# Patient Record
Sex: Female | Born: 1940
Health system: Southern US, Community
[De-identification: ages and names within clinical notes are randomized; demographics above are authoritative.]

## PROBLEM LIST (undated history)

## (undated) DIAGNOSIS — I10 Essential (primary) hypertension: Secondary | ICD-10-CM

## (undated) DIAGNOSIS — R251 Tremor, unspecified: Secondary | ICD-10-CM

## (undated) DIAGNOSIS — H409 Unspecified glaucoma: Secondary | ICD-10-CM

## (undated) DIAGNOSIS — D219 Benign neoplasm of connective and other soft tissue, unspecified: Secondary | ICD-10-CM

## (undated) DIAGNOSIS — Z9289 Personal history of other medical treatment: Secondary | ICD-10-CM

## (undated) DIAGNOSIS — E785 Hyperlipidemia, unspecified: Secondary | ICD-10-CM

## (undated) DIAGNOSIS — N898 Other specified noninflammatory disorders of vagina: Secondary | ICD-10-CM

## (undated) DIAGNOSIS — M1990 Unspecified osteoarthritis, unspecified site: Secondary | ICD-10-CM

## (undated) DIAGNOSIS — D649 Anemia, unspecified: Secondary | ICD-10-CM

## (undated) DIAGNOSIS — I2510 Atherosclerotic heart disease of native coronary artery without angina pectoris: Secondary | ICD-10-CM

## (undated) HISTORY — DX: Anemia, unspecified: D64.9

## (undated) HISTORY — DX: Tremor, unspecified: R25.1

## (undated) HISTORY — DX: Hyperlipidemia, unspecified: E78.5

## (undated) HISTORY — DX: Other specified noninflammatory disorders of vagina: N89.8

## (undated) HISTORY — DX: Benign neoplasm of connective and other soft tissue, unspecified: D21.9

## (undated) HISTORY — DX: Unspecified osteoarthritis, unspecified site: M19.90

## (undated) HISTORY — DX: Personal history of other medical treatment: Z92.89

## (undated) HISTORY — DX: Atherosclerotic heart disease of native coronary artery without angina pectoris: I25.10

## (undated) HISTORY — DX: Unspecified glaucoma: H40.9

## (undated) HISTORY — PX: BREAST SURGERY: SHX581

## (undated) HISTORY — DX: Essential (primary) hypertension: I10

---

## 1975-07-26 HISTORY — PX: TUBAL LIGATION: SHX77

## 1993-07-25 HISTORY — PX: ABDOMINAL HYSTERECTOMY: SHX81

## 1998-06-02 ENCOUNTER — Ambulatory Visit (HOSPITAL_COMMUNITY): Admission: RE | Admit: 1998-06-02 | Discharge: 1998-06-02 | Payer: Self-pay | Admitting: Gastroenterology

## 2000-11-02 ENCOUNTER — Ambulatory Visit (HOSPITAL_COMMUNITY): Admission: RE | Admit: 2000-11-02 | Discharge: 2000-11-02 | Payer: Self-pay | Admitting: Family Medicine

## 2000-11-02 ENCOUNTER — Encounter: Payer: Self-pay | Admitting: Family Medicine

## 2001-10-12 ENCOUNTER — Other Ambulatory Visit: Admission: RE | Admit: 2001-10-12 | Discharge: 2001-10-12 | Payer: Self-pay | Admitting: Obstetrics and Gynecology

## 2001-10-17 ENCOUNTER — Ambulatory Visit (HOSPITAL_COMMUNITY): Admission: RE | Admit: 2001-10-17 | Discharge: 2001-10-17 | Payer: Self-pay | Admitting: Gastroenterology

## 2001-11-07 ENCOUNTER — Encounter: Payer: Self-pay | Admitting: Gastroenterology

## 2001-11-07 ENCOUNTER — Encounter: Admission: RE | Admit: 2001-11-07 | Discharge: 2001-11-07 | Payer: Self-pay | Admitting: Gastroenterology

## 2003-07-24 ENCOUNTER — Ambulatory Visit (HOSPITAL_COMMUNITY): Admission: RE | Admit: 2003-07-24 | Discharge: 2003-07-24 | Payer: Self-pay | Admitting: *Deleted

## 2003-07-26 HISTORY — PX: TRANSTHORACIC ECHOCARDIOGRAM: SHX275

## 2003-08-21 ENCOUNTER — Ambulatory Visit (HOSPITAL_COMMUNITY): Admission: RE | Admit: 2003-08-21 | Discharge: 2003-08-21 | Payer: Self-pay | Admitting: Neurology

## 2003-12-08 ENCOUNTER — Other Ambulatory Visit: Admission: RE | Admit: 2003-12-08 | Discharge: 2003-12-08 | Payer: Self-pay | Admitting: Obstetrics and Gynecology

## 2007-05-24 ENCOUNTER — Ambulatory Visit (HOSPITAL_COMMUNITY): Admission: RE | Admit: 2007-05-24 | Discharge: 2007-05-24 | Payer: Self-pay | Admitting: *Deleted

## 2007-05-31 HISTORY — PX: CARDIAC CATHETERIZATION: SHX172

## 2007-08-30 ENCOUNTER — Encounter: Admission: RE | Admit: 2007-08-30 | Discharge: 2007-08-30 | Payer: Self-pay | Admitting: Family Medicine

## 2008-04-30 ENCOUNTER — Ambulatory Visit: Payer: Self-pay | Admitting: Vascular Surgery

## 2010-05-25 DIAGNOSIS — Z9289 Personal history of other medical treatment: Secondary | ICD-10-CM

## 2010-05-25 HISTORY — DX: Personal history of other medical treatment: Z92.89

## 2010-08-14 ENCOUNTER — Encounter: Payer: Self-pay | Admitting: Neurology

## 2010-11-05 ENCOUNTER — Emergency Department (HOSPITAL_COMMUNITY)
Admission: EM | Admit: 2010-11-05 | Discharge: 2010-11-05 | Disposition: A | Discharge status: Home / Self Care | Payer: Medicare Other | Attending: Emergency Medicine | Admitting: Emergency Medicine

## 2010-11-05 ENCOUNTER — Emergency Department (HOSPITAL_COMMUNITY): Payer: Medicare Other

## 2010-11-05 DIAGNOSIS — Z79899 Other long term (current) drug therapy: Secondary | ICD-10-CM | POA: Insufficient documentation

## 2010-11-05 DIAGNOSIS — Y929 Unspecified place or not applicable: Secondary | ICD-10-CM | POA: Insufficient documentation

## 2010-11-05 DIAGNOSIS — R51 Headache: Secondary | ICD-10-CM | POA: Insufficient documentation

## 2010-11-05 DIAGNOSIS — S0003XA Contusion of scalp, initial encounter: Secondary | ICD-10-CM | POA: Insufficient documentation

## 2010-11-05 DIAGNOSIS — S40029A Contusion of unspecified upper arm, initial encounter: Secondary | ICD-10-CM | POA: Insufficient documentation

## 2010-11-05 DIAGNOSIS — M25559 Pain in unspecified hip: Secondary | ICD-10-CM | POA: Insufficient documentation

## 2010-11-05 DIAGNOSIS — M5020 Other cervical disc displacement, unspecified cervical region: Secondary | ICD-10-CM | POA: Insufficient documentation

## 2010-11-05 DIAGNOSIS — I10 Essential (primary) hypertension: Secondary | ICD-10-CM | POA: Insufficient documentation

## 2010-11-05 DIAGNOSIS — R220 Localized swelling, mass and lump, head: Secondary | ICD-10-CM | POA: Insufficient documentation

## 2010-11-25 ENCOUNTER — Other Ambulatory Visit: Payer: Self-pay | Admitting: Orthopedic Surgery

## 2010-11-25 DIAGNOSIS — M25512 Pain in left shoulder: Secondary | ICD-10-CM

## 2010-11-25 DIAGNOSIS — M25511 Pain in right shoulder: Secondary | ICD-10-CM

## 2010-11-25 DIAGNOSIS — M542 Cervicalgia: Secondary | ICD-10-CM

## 2010-11-25 DIAGNOSIS — R519 Headache, unspecified: Secondary | ICD-10-CM

## 2010-12-02 ENCOUNTER — Ambulatory Visit (HOSPITAL_COMMUNITY)
Admission: RE | Admit: 2010-12-02 | Discharge: 2010-12-02 | Disposition: A | Discharge status: Home / Self Care | Payer: Medicare Other | Source: Ambulatory Visit | Attending: Family Medicine | Admitting: Family Medicine

## 2010-12-02 DIAGNOSIS — M25659 Stiffness of unspecified hip, not elsewhere classified: Secondary | ICD-10-CM | POA: Insufficient documentation

## 2010-12-02 DIAGNOSIS — I10 Essential (primary) hypertension: Secondary | ICD-10-CM | POA: Insufficient documentation

## 2010-12-02 DIAGNOSIS — M25559 Pain in unspecified hip: Secondary | ICD-10-CM | POA: Insufficient documentation

## 2010-12-02 DIAGNOSIS — M6281 Muscle weakness (generalized): Secondary | ICD-10-CM | POA: Insufficient documentation

## 2010-12-02 DIAGNOSIS — R262 Difficulty in walking, not elsewhere classified: Secondary | ICD-10-CM | POA: Insufficient documentation

## 2010-12-02 DIAGNOSIS — IMO0001 Reserved for inherently not codable concepts without codable children: Secondary | ICD-10-CM | POA: Insufficient documentation

## 2010-12-07 NOTE — Consult Note (Signed)
NEW PATIENT CONSULTATION   Nicole Meadows, Nicole Meadows  DOB:  February 23, 1941                                       04/30/2008  ZOXWR#:[id number]   The patient presents today for evaluation of bruising spontaneously on  her legs, abdomen, and arms.  She also had a prior history of spider  vein telangiectasia and is seen today for evaluation.  She currently  does not have any bruising present.  She has been on aspirin therapy for  over 3 years.  She does not have any other history of bleeding  tendencies, specifically no GI bleeding.  She does have a history of  reflux.  Her main medical problems are hypertension and elevated  cholesterol, which are both under control with medication.   FAMILY HISTORY:  Atherosclerotic disease in her mother and brothers.  Also has a history of abdominal aortic aneurysm in her brother.   SOCIAL HISTORY:  She is married with 2 children.  She is a retired  Runner, broadcasting/film/video.  She does not smoke or drink alcohol.   REVIEW OF SYSTEMS:  Positive for weight gain at 192 pounds.  She is 5  feet 5 inches tall.  She has shortness of breath with exertion,  bronchitis, headache, arthritis, and recent eyesight changes.   CURRENT MEDICATIONS:  Micardis hydrochlorothiazide 80 mg/12.5 mg.  Protonix.  Crestor.  Astelin.  81 mg aspirin.   ALLERGIES:  Penicillin causes rash.   PHYSICAL EXAM:  Well-developed, well-nourished black female appearing  stated age of 36.  Blood pressure is 126/75, pulse 84, respirations 18.  Her extremities are noted for no bruises.  She has 2+ radial and 2+  dorsalis pedis pulses bilaterally.  She does have mild pitting edema at  the levels of the ankles.  She has very few scattered telangiectasia on  her upper lateral thighs.  She does not have any varicosities.   She underwent a screening venous duplex by me and this showed no  evidence of reflux in her normal-sized saphenous veins bilaterally.  I  discussed this at length with the  patient.  I explained that I suspect  her bruising probably is related to her aspirin therapy.  She reports  having blood work done at Dr. Tedra [place] office, but does not recall  discussing platelets.  I explained that this is a very common lab study  and suspect that this has been checked, but if not, she will discuss  this with Dr. Parke Simmers at her next visit as well.  She will see [place] again on  as needed basis.   Larina Earthly, M.D.  Electronically Signed   TFE/MEDQ  D:  04/30/2008  T:  05/01/2008  Job:  [id number]   cc:   Renaye Rakers, M.D.

## 2010-12-09 ENCOUNTER — Ambulatory Visit
Admission: RE | Admit: 2010-12-09 | Discharge: 2010-12-09 | Disposition: A | Discharge status: Home / Self Care | Payer: Medicare Other | Source: Ambulatory Visit | Attending: Orthopedic Surgery | Admitting: Orthopedic Surgery

## 2010-12-09 DIAGNOSIS — M542 Cervicalgia: Secondary | ICD-10-CM

## 2010-12-09 DIAGNOSIS — M25511 Pain in right shoulder: Secondary | ICD-10-CM

## 2010-12-09 DIAGNOSIS — R519 Headache, unspecified: Secondary | ICD-10-CM

## 2010-12-13 ENCOUNTER — Ambulatory Visit (HOSPITAL_COMMUNITY): Payer: Medicare Other | Admitting: Physical Therapy

## 2010-12-16 ENCOUNTER — Ambulatory Visit (HOSPITAL_COMMUNITY)
Admission: RE | Admit: 2010-12-16 | Discharge: 2010-12-16 | Disposition: A | Discharge status: Home / Self Care | Payer: Medicare Other | Source: Ambulatory Visit | Attending: Family Medicine | Admitting: Family Medicine

## 2010-12-22 ENCOUNTER — Ambulatory Visit (HOSPITAL_COMMUNITY)
Admission: RE | Admit: 2010-12-22 | Discharge: 2010-12-22 | Disposition: A | Discharge status: Home / Self Care | Payer: Medicare Other | Source: Ambulatory Visit | Attending: Family Medicine | Admitting: Family Medicine

## 2010-12-27 ENCOUNTER — Ambulatory Visit (HOSPITAL_COMMUNITY)
Admission: RE | Admit: 2010-12-27 | Discharge: 2010-12-27 | Disposition: A | Discharge status: Home / Self Care | Payer: Medicare Other | Source: Ambulatory Visit | Attending: Family Medicine | Admitting: Family Medicine

## 2010-12-27 DIAGNOSIS — M25659 Stiffness of unspecified hip, not elsewhere classified: Secondary | ICD-10-CM | POA: Insufficient documentation

## 2010-12-27 DIAGNOSIS — M6281 Muscle weakness (generalized): Secondary | ICD-10-CM | POA: Insufficient documentation

## 2010-12-27 DIAGNOSIS — IMO0001 Reserved for inherently not codable concepts without codable children: Secondary | ICD-10-CM | POA: Insufficient documentation

## 2010-12-27 DIAGNOSIS — I10 Essential (primary) hypertension: Secondary | ICD-10-CM | POA: Insufficient documentation

## 2010-12-27 DIAGNOSIS — M25559 Pain in unspecified hip: Secondary | ICD-10-CM | POA: Insufficient documentation

## 2010-12-27 DIAGNOSIS — R262 Difficulty in walking, not elsewhere classified: Secondary | ICD-10-CM | POA: Insufficient documentation

## 2010-12-30 ENCOUNTER — Ambulatory Visit (HOSPITAL_COMMUNITY)
Admission: RE | Admit: 2010-12-30 | Discharge: 2010-12-30 | Disposition: A | Discharge status: Home / Self Care | Payer: Medicare Other | Source: Ambulatory Visit | Attending: Family Medicine | Admitting: Family Medicine

## 2012-04-06 DIAGNOSIS — R251 Tremor, unspecified: Secondary | ICD-10-CM | POA: Insufficient documentation

## 2012-04-12 ENCOUNTER — Encounter: Payer: Self-pay | Admitting: Obstetrics and Gynecology

## 2012-04-12 ENCOUNTER — Ambulatory Visit (INDEPENDENT_AMBULATORY_CARE_PROVIDER_SITE_OTHER): Payer: Medicare Other | Admitting: Obstetrics and Gynecology

## 2012-04-12 VITALS — BP 104/64 | HR 72 | Resp 16 | Ht 65.0 in | Wt 187.0 lb

## 2012-04-12 DIAGNOSIS — Z01419 Encounter for gynecological examination (general) (routine) without abnormal findings: Secondary | ICD-10-CM

## 2012-04-12 DIAGNOSIS — Z124 Encounter for screening for malignant neoplasm of cervix: Secondary | ICD-10-CM

## 2012-04-12 NOTE — Progress Notes (Signed)
Subjective:    Nicole Meadows is a 71 y.o. female G2P2 who presents for annual exam. Doing well. Status post hysterectomy and BSO.  The following portions of the patient's history were reviewed and updated as appropriate: allergies, current medications, past family history, past medical history, past social history, past surgical history and problem list. The patient has a history of osteopenia.  Review of Systems Pertinent items are noted in HPI. Gastrointestinal:No change in bowel habits, no abdominal pain, no rectal bleeding Genitourinary:negative for dysuria, frequency, hematuria, nocturia and urinary incontinence    Objective:     BP 104/64  Pulse 72  Resp 16  Ht 5\' 5"  (1.651 m)  Wt 187 lb (84.823 kg)  BMI 31.12 kg/m2  Weight:  Wt Readings from Last 1 Encounters:  04/12/12 187 lb (84.823 kg)     BMI: Body mass index is 31.12 kg/(m^2). General Appearance: Alert, appropriate appearance for age. No acute distress HEENT: Grossly normal Neck / Thyroid: Supple, no masses, nodes or enlargement Lungs: clear to auscultation bilaterally Back: No CVA tenderness Breast Exam: No masses or nodes.No dimpling, nipple retraction or discharge. Cardiovascular: Regular rate and rhythm. S1, S2, no murmur Gastrointestinal: Soft, non-tender, no masses or organomegaly  ++++++++++++++++++++++++++++++++++++++++++++++++++++++++  Pelvic Exam: External genitalia: normal general appearance Vaginal: atrophic, relaxation noted Cervix: absent Adnexa: normal bimanual exam Uterus: absent Rectovaginal: normal rectal, no masses  ++++++++++++++++++++++++++++++++++++++++++++++++++++++++  Lymphatic Exam: Non-palpable nodes in neck, clavicular, axillary, or inguinal regions  Psychiatric: Alert and oriented, appropriate affect.@OBJECTIVEEN @      Assessment:    Normal gyn exam Menopause   Overweight or obese: Yes  Pelvic relaxation: Yes  Menopausal symptoms: No. Severe: No.  Osteopenia.     Plan:    Mammogram.   Follow-up:  for annual exam  The updated Pap smear screening guidelines were discussed with the patient. The patient requested that I obtain a Pap smear: No.  Kegel exercises discussed: Yes.  Proper diet and regular exercise were reviewed.  Annual mammograms recommended starting at age 75. Proper breast care was discussed.  Screening colonoscopy is recommended beginning at age 3.  Regular health maintenance was reviewed.  Sleep hygiene was discussed.  Adequate calcium and vitamin D intake was emphasized. Needs bone density scan.  Leonard Schwartz M.D.   Regular Periods: no/Hysterectomy Mammogram: yes  Monthly Breast Ex.: yes Exercise: yes  Tetanus < 10 years: yes Seatbelts: yes  NI. Bladder Functn.: yes Abuse at home: no  Daily BM's: yes Stressful Work: no  Healthy Diet: yes Sigmoid-Colonoscopy 2003 WNL   Calcium: yes Medical problems this year: None   LAST PAP:09/16/2008 WNL  Contraception: None  Mammogram:  06/06/2011 WNL  PCP: Kipp Brood  PMH: None  FMH: None

## 2012-04-23 ENCOUNTER — Telehealth: Payer: Self-pay

## 2012-04-23 NOTE — Telephone Encounter (Signed)
Per AVS he wanted pt to have a Bone Density scheduled if she has not had one in 2 years. Pt was called this morning to be made aware. And pt stated she has a Bone Density scheduled in November at the facility where she gets her mammograms.  [facility] CMA

## 2012-06-01 ENCOUNTER — Ambulatory Visit (HOSPITAL_COMMUNITY)
Admission: RE | Admit: 2012-06-01 | Discharge: 2012-06-01 | Disposition: A | Discharge status: Home / Self Care | Payer: Medicare Other | Source: Ambulatory Visit | Attending: Family Medicine | Admitting: Family Medicine

## 2012-06-01 ENCOUNTER — Other Ambulatory Visit (HOSPITAL_COMMUNITY): Payer: Self-pay | Admitting: Family Medicine

## 2012-06-01 DIAGNOSIS — R519 Headache, unspecified: Secondary | ICD-10-CM

## 2012-06-01 DIAGNOSIS — R51 Headache: Secondary | ICD-10-CM | POA: Insufficient documentation

## 2012-06-18 ENCOUNTER — Encounter: Payer: Self-pay | Admitting: Obstetrics and Gynecology

## 2012-06-27 NOTE — Progress Notes (Signed)
Quick Note:  Send a letter that her labs are normal. Include a copy of the labs with the letter. ______ 

## 2013-02-19 ENCOUNTER — Other Ambulatory Visit (HOSPITAL_COMMUNITY)
Admission: RE | Admit: 2013-02-19 | Discharge: 2013-02-19 | Disposition: A | Discharge status: Home / Self Care | Payer: Medicare Other | Source: Ambulatory Visit | Attending: Family Medicine | Admitting: Family Medicine

## 2013-02-19 DIAGNOSIS — N760 Acute vaginitis: Secondary | ICD-10-CM | POA: Insufficient documentation

## 2013-07-05 ENCOUNTER — Encounter: Payer: Self-pay | Admitting: *Deleted

## 2013-07-08 ENCOUNTER — Encounter: Payer: Self-pay | Admitting: Internal Medicine

## 2013-07-08 ENCOUNTER — Ambulatory Visit (INDEPENDENT_AMBULATORY_CARE_PROVIDER_SITE_OTHER): Payer: Medicare Other | Admitting: Internal Medicine

## 2013-07-08 VITALS — BP 112/68 | HR 73 | Ht 65.5 in | Wt 192.1 lb

## 2013-07-08 DIAGNOSIS — I10 Essential (primary) hypertension: Secondary | ICD-10-CM | POA: Insufficient documentation

## 2013-07-08 DIAGNOSIS — E785 Hyperlipidemia, unspecified: Secondary | ICD-10-CM

## 2013-07-08 MED ORDER — TELMISARTAN-HCTZ 80-12.5 MG PO TABS: 1.0000 | ORAL_TABLET | Freq: Every day | ORAL | Status: AC

## 2013-07-08 NOTE — Patient Instructions (Signed)
Your physician wants you to follow-up in: 1 year. You will receive a reminder letter in the mail two months in advance. If you don't receive a letter, please call our office to schedule the follow-up appointment.  

## 2013-07-08 NOTE — Progress Notes (Signed)
OFFICE NOTE  Chief Complaint:  Left breast pain  Primary Care Physician: Geraldo Pitter, MD  HPI:  Nicole Meadows  is a pleasant 72 year old female with history of moderate coronary disease by cath on medical therapy, also problems with up-and-down weight and essential tremor. She has dyslipidemia which has been fairly well controlled and hypertension which is also at goal. Her only other concern is that really she's had some left breast pain. The symptom are somewhat toothache-like, and seemed to been improved with topical corticosteroids. She saw dermatologist who prescribed this medicine. She denies any worsening shortness of breath with exertion but has had several pound weight gain and knows that she needs to work on this. She's been taking care of her husband who recently had knee replacement.  PMHx:  Past Medical History  Diagnosis Date  . Tremor   . Vaginal dryness   . Fibroid   . Hypertension   . Coronary artery disease     moderate, by cath  . History of nuclear stress test 05/2010    bruce myoview; small fixed distal anteroapical, apical defect (breast attenuation or apical thinning), no reversible ischemia, post-stress EF 83%, abnormal but low risk study   . Dyslipidemia     Past Surgical History  Procedure Laterality Date  . Abdominal hysterectomy  1995  . Tubal ligation  1977  . Transthoracic echocardiogram  2005    EF 60% with borderline conc LVH; mild TR, RVSP   . Cardiac catheterization  05/31/2007    no signficant CAD, low normal EF (Dr. Laurell Josephs)     FAMHx:  Family History  Problem Relation Age of Onset  . Cancer Sister     uterine  . Hypertension Mother   . Sudden death Mother   . Heart disease Father   . Kidney failure Father   . Heart failure Brother     DM, HTN  . Aneurysm Brother     abdominal; also HTN  . Hypertension Brother     sudden death  . Hypertension Brother   . Hyperlipidemia Brother     also DM  . Hypertension  Brother   . Other Brother     2 brain surgeries, bleeding    SOCHx:   reports that she has never smoked. She has never used smokeless tobacco. She reports that she does not drink alcohol or use illicit drugs.  ALLERGIES:  Allergies  Allergen Reactions  . Penicillins   . Zocor [Simvastatin]     myalgias    ROS: A comprehensive review of systems was negative except for: Constitutional: positive for weight gain Respiratory: positive for dyspnea on exertion  HOME MEDS: Current Outpatient Prescriptions  Medication Sig Dispense Refill  . aspirin 81 MG tablet Take 81 mg by mouth daily.      [name] augmented betamethasone dipropionate (DIPROLENE-AF) 0.05 % cream daily as needed.      [name] azelastine (ASTELIN) 137 MCG/SPRAY nasal spray Place 1 spray into the nose 2 (two) times daily. Use in each nostril as directed      . Calcium Carbonate-Vitamin D (CALCIUM-D PO) Take by mouth.      . cycloSPORINE (RESTASIS) 0.05 % ophthalmic emulsion Place 1 drop into both eyes daily.      . fluticasone (FLONASE) 50 MCG/ACT nasal spray Place 1 spray into both nostrils every morning.       . nitroGLYCERIN (NITROSTAT) 0.4 MG SL tablet Place 0.4 mg under the tongue every 5 (five)  minutes as needed for chest pain.      . Omega 3-6-9 Fatty Acids (OMEGA 3-6-9 COMPLEX PO) Take 1,200 mg by mouth daily.      . pantoprazole (PROTONIX) 40 MG tablet Take 40 mg by mouth daily.      . rosuvastatin (CRESTOR) 10 MG tablet Take 5 mg by mouth daily.       [name] telmisartan-hydrochlorothiazide (MICARDIS HCT) 80-12.5 MG per tablet Take 1 tablet by mouth daily.  28 tablet  0   No current facility-administered medications for this visit.    LABS/IMAGING: No results found for this or any previous visit (from the past 48 hour(s)). No results found.  VITALS: BP 112/68  Pulse 73  Ht 5' 5.5" (1.664 m)  Wt 192 lb 1.6 oz (87.136 kg)  BMI 31.47 kg/m2  EXAM: General appearance: alert and no distress Neck: no carotid bruit and no  JVD Lungs: clear to auscultation bilaterally Heart: regular rate and rhythm, S1, S2 normal, no murmur, click, rub or gallop Abdomen: soft, non-tender; bowel sounds normal; no masses,  no organomegaly Extremities: extremities normal, atraumatic, no cyanosis or edema Pulses: 2+ and symmetric Skin: Skin color, texture, turgor normal. No rashes or lesions Neurologic: Grossly normal psych: Mood, affect normal  EKG: Normal sinus rhythm at 73  ASSESSMENT: 1. Hypertension-controlled 2. Dyslipidemia-at goal 3. Essential tremor 4. Obesity  PLAN: 1.   Nicole Meadows is doing well and his head good blood pressure control and my card is. She prefers to stay on the brand name medication and we've given her samples of that today. She is on Crestor and had lipid testing about 6 months ago which was well-controlled. She is due for another check to her primary care provider next month. Her tremor is stable. Otherwise she's had a small amount of weight gain he needs to work on exercise and weight loss, but is doing well. We'll plan to see her back annually or sooner as necessary.  Chrystie Nose, MD, [facility] Attending Cardiologist [facility]  Nicole Meadows 07/08/2013, 11:51 AM

## 2013-08-07 ENCOUNTER — Ambulatory Visit (HOSPITAL_COMMUNITY)
Admission: RE | Admit: 2013-08-07 | Discharge: 2013-08-07 | Disposition: A | Discharge status: Home / Self Care | Payer: Medicare Other | Source: Ambulatory Visit | Attending: Family Medicine | Admitting: Family Medicine

## 2013-08-07 ENCOUNTER — Other Ambulatory Visit (HOSPITAL_COMMUNITY): Payer: Self-pay | Admitting: Family Medicine

## 2013-08-07 DIAGNOSIS — M1250 Traumatic arthropathy, unspecified site: Secondary | ICD-10-CM

## 2013-08-07 DIAGNOSIS — M25559 Pain in unspecified hip: Secondary | ICD-10-CM | POA: Insufficient documentation

## 2013-08-07 DIAGNOSIS — M545 Low back pain, unspecified: Secondary | ICD-10-CM | POA: Insufficient documentation

## 2013-08-22 ENCOUNTER — Encounter (INDEPENDENT_AMBULATORY_CARE_PROVIDER_SITE_OTHER): Payer: Self-pay | Admitting: Surgery

## 2013-08-30 ENCOUNTER — Ambulatory Visit (INDEPENDENT_AMBULATORY_CARE_PROVIDER_SITE_OTHER): Payer: Medicare Other | Admitting: Surgery

## 2013-08-30 ENCOUNTER — Encounter (INDEPENDENT_AMBULATORY_CARE_PROVIDER_SITE_OTHER): Payer: Self-pay | Admitting: Surgery

## 2013-08-30 ENCOUNTER — Encounter (INDEPENDENT_AMBULATORY_CARE_PROVIDER_SITE_OTHER): Payer: Self-pay

## 2013-08-30 VITALS — BP 120/88 | HR 68 | Temp 97.8°F | Resp 14 | Ht 65.5 in | Wt 190.6 lb

## 2013-08-30 DIAGNOSIS — C50019 Malignant neoplasm of nipple and areola, unspecified female breast: Secondary | ICD-10-CM | POA: Insufficient documentation

## 2013-08-30 NOTE — Progress Notes (Signed)
Patient ID: [name], female   DOB: [birth year], 73 y.o.   MRN: [id number]  Chief Complaint  Patient presents with  . New Evaluation    eval left br and nipple/tenderess and pain and itching    HPI Nicole Meadows is a 73 y.o. female.   HPI This is a pleasant female referred by Dr. Criss Rosales for evaluation of left breast nipple and areolar itching and breast pain. This has been occurring intermittently for the past year. She has had normal mammograms for the past 2 years except for dense breasts. She has had skin issues with dermatitis. She denies any drainage from the nipples. She has not had had biopsies of the breast in the past. Past Medical History  Diagnosis Date  . Tremor   . Vaginal dryness   . Fibroid   . Hypertension   . Coronary artery disease     moderate, by cath  . History of nuclear stress test 05/2010    bruce myoview; small fixed distal anteroapical, apical defect (breast attenuation or apical thinning), no reversible ischemia, post-stress EF 83%, abnormal but low risk study   . Dyslipidemia   . Anemia   . Arthritis   . Glaucoma   . Hyperlipidemia     Past Surgical History  Procedure Laterality Date  . Abdominal hysterectomy  1995  . Tubal ligation  1977  . Transthoracic echocardiogram  2005    EF 60% with borderline conc LVH; mild TR, RVSP 31mmHg   . Cardiac catheterization  05/31/2007    no signficant CAD, low normal EF (Dr. Gerrie Nordmann)     Family History  Problem Relation Age of Onset  . Cancer Sister     uterine  . Hypertension Mother   . Sudden death Mother   . Heart disease Father   . Kidney failure Father   . Heart failure Brother     DM, HTN  . Aneurysm Brother     abdominal; also HTN  . Hypertension Brother     sudden death  . Hypertension Brother   . Hyperlipidemia Brother     also DM  . Hypertension Brother   . Other Brother     2 brain surgeries, bleeding    Social History History  Substance Use Topics  . Smoking status:  Never Smoker   . Smokeless tobacco: Never Used  . Alcohol Use: No    Allergies  Allergen Reactions  . Penicillins   . Zocor [Simvastatin]     myalgias    Current Outpatient Prescriptions  Medication Sig Dispense Refill  . aspirin 81 MG tablet Take 81 mg by mouth daily.      [name] augmented betamethasone dipropionate (DIPROLENE-AF) 0.05 % cream daily as needed.      [name] azelastine (ASTELIN) 137 MCG/SPRAY nasal spray Place 1 spray into the nose 2 (two) times daily. Use in each nostril as directed      . Calcium Carbonate-Vitamin D (CALCIUM-D PO) Take by mouth.      . calcium citrate-vitamin D (CITRACAL+D) 315-200 MG-UNIT per tablet Take 1 tablet by mouth 2 (two) times daily.      . celecoxib (CELEBREX) 200 MG capsule       . cycloSPORINE (RESTASIS) 0.05 % ophthalmic emulsion Place 1 drop into both eyes daily.      [name] FLAX OIL-FISH OIL-BORAGE OIL PO Take by mouth.      . fluticasone (FLONASE) 50 MCG/ACT nasal spray Place 1 spray into both nostrils every  morning.       . nitroGLYCERIN (NITROSTAT) 0.4 MG SL tablet Place 0.4 mg under the tongue every 5 (five) minutes as needed for chest pain.      . Omega 3-6-9 Fatty Acids (OMEGA 3-6-9 COMPLEX PO) Take 1,200 mg by mouth daily.      . pantoprazole (PROTONIX) 40 MG tablet Take 40 mg by mouth daily.      . rosuvastatin (CRESTOR) 10 MG tablet Take 5 mg by mouth daily.       [name] telmisartan-hydrochlorothiazide (MICARDIS HCT) 80-12.5 MG per tablet Take 1 tablet by mouth daily.  28 tablet  0   No current facility-administered medications for this visit.    Review of Systems Review of Systems  Constitutional: Negative for fever, chills and unexpected weight change.  HENT: Negative for congestion, hearing loss, sore throat, trouble swallowing and voice change.   Eyes: Negative for visual disturbance.  Respiratory: Negative for cough and wheezing.   Cardiovascular: Negative for chest pain, palpitations and leg swelling.  Gastrointestinal: Negative for  nausea, vomiting, abdominal pain, diarrhea, constipation, blood in stool, abdominal distention and anal bleeding.  Genitourinary: Negative for hematuria, vaginal bleeding and difficulty urinating.  Musculoskeletal: Negative for arthralgias.  Skin: Negative for rash and wound.  Neurological: Negative for seizures, syncope and headaches.  Hematological: Negative for adenopathy. Does not bruise/bleed easily.  Psychiatric/Behavioral: Negative for confusion.    Blood pressure 120/88, pulse 68, temperature 97.8 F (36.6 C), temperature source Oral, resp. rate 14, height 5' 5.5" (1.664 m), weight 190 lb 9.6 oz (86.456 kg).  Physical Exam Physical Exam  Constitutional: She is oriented to person, place, and time. She appears well-developed and well-nourished. No distress.  HENT:  Head: Normocephalic and atraumatic.  Right Ear: External ear normal.  Left Ear: External ear normal.  Nose: Nose normal.  Mouth/Throat: Oropharynx is clear and moist. No oropharyngeal exudate.  Eyes: Conjunctivae are normal. Pupils are equal, round, and reactive to light.  Neck: Normal range of motion. Neck supple. No tracheal deviation present. No thyromegaly present.  Cardiovascular: Normal rate, regular rhythm, normal heart sounds and intact distal pulses.   No murmur heard. Pulmonary/Chest: Breath sounds normal. No respiratory distress. She has no wheezes.  Abdominal: Soft. Bowel sounds are normal. There is no tenderness.  Musculoskeletal: Normal range of motion. She exhibits no edema and no tenderness.  Lymphadenopathy:    She has no cervical adenopathy.    She has no axillary adenopathy.  Neurological: She is alert and oriented to person, place, and time.  She does have a tremor  Skin: Skin is warm and dry. No rash noted. No erythema.  Psychiatric: Her behavior is normal. Judgment normal.  Breasts: There are no palpable masses in the breast. There is a scaly, rough patch of skin on the areola of the left  breast.  Data Reviewed   Assessment    Left breast itching and abnormal skin of the left breast areola     Plan    I believe this area needs to be biopsied to rule out Paget's disease. I discussed this with her in detail. I discussed the risks of surgery which includes but is not limited to bleeding, infection, need for further surgery, et Ronney Asters. She understands and wished to proceed. Surgery will be scheduled        Grizelda Piscopo A 08/30/2013, 12:01 PM

## 2013-09-26 ENCOUNTER — Other Ambulatory Visit (INDEPENDENT_AMBULATORY_CARE_PROVIDER_SITE_OTHER): Payer: Self-pay | Admitting: Surgery

## 2013-09-26 ENCOUNTER — Other Ambulatory Visit (INDEPENDENT_AMBULATORY_CARE_PROVIDER_SITE_OTHER): Payer: Self-pay | Admitting: *Deleted

## 2013-09-26 DIAGNOSIS — L259 Unspecified contact dermatitis, unspecified cause: Secondary | ICD-10-CM

## 2013-09-26 MED ORDER — HYDROCODONE-ACETAMINOPHEN 5-325 MG PO TABS: 1.0000 | ORAL_TABLET | ORAL | Status: DC | PRN

## 2013-10-21 ENCOUNTER — Ambulatory Visit (INDEPENDENT_AMBULATORY_CARE_PROVIDER_SITE_OTHER): Payer: Medicare Other | Admitting: Surgery

## 2013-10-21 ENCOUNTER — Encounter (INDEPENDENT_AMBULATORY_CARE_PROVIDER_SITE_OTHER): Payer: Self-pay

## 2013-10-21 VITALS — BP 138/78 | HR 77 | Temp 98.4°F | Resp 16 | Ht 65.0 in | Wt 194.0 lb

## 2013-10-21 DIAGNOSIS — Z09 Encounter for follow-up examination after completed treatment for conditions other than malignant neoplasm: Secondary | ICD-10-CM

## 2013-10-21 NOTE — Progress Notes (Signed)
Subjective:     Patient ID: Nicole Meadows, female   DOB: [birth year], 73 y.o.   MRN: [id number]  HPI She is here for her first postoperative visit status post incisional biopsy of the left breast areola. She is doing well and has no complaints  Review of Systems     Objective:   Physical Exam On exam, her incision is well-healed. There dermatitis remains on the areola of the left breast  The pathology confirmed a dermatitis with no evidence of Paget's disease or malignancy    Assessment:     Patient stable postop     Plan:     I explained the path report to her. She will call back her dermatologist for followup. I will see her as needed

## 2013-10-30 ENCOUNTER — Encounter (HOSPITAL_COMMUNITY): Payer: Self-pay | Admitting: Emergency Medicine

## 2013-10-30 ENCOUNTER — Emergency Department (HOSPITAL_COMMUNITY)
Admission: EM | Admit: 2013-10-30 | Discharge: 2013-10-30 | Disposition: A | Discharge status: Home / Self Care | Payer: Medicare Other | Attending: Emergency Medicine | Admitting: Emergency Medicine

## 2013-10-30 ENCOUNTER — Emergency Department (HOSPITAL_COMMUNITY): Payer: Medicare Other

## 2013-10-30 DIAGNOSIS — Z8742 Personal history of other diseases of the female genital tract: Secondary | ICD-10-CM | POA: Insufficient documentation

## 2013-10-30 DIAGNOSIS — Z880 Allergy status to penicillin: Secondary | ICD-10-CM | POA: Insufficient documentation

## 2013-10-30 DIAGNOSIS — I10 Essential (primary) hypertension: Secondary | ICD-10-CM | POA: Insufficient documentation

## 2013-10-30 DIAGNOSIS — M129 Arthropathy, unspecified: Secondary | ICD-10-CM | POA: Insufficient documentation

## 2013-10-30 DIAGNOSIS — E785 Hyperlipidemia, unspecified: Secondary | ICD-10-CM | POA: Insufficient documentation

## 2013-10-30 DIAGNOSIS — Z79899 Other long term (current) drug therapy: Secondary | ICD-10-CM | POA: Insufficient documentation

## 2013-10-30 DIAGNOSIS — IMO0002 Reserved for concepts with insufficient information to code with codable children: Secondary | ICD-10-CM | POA: Insufficient documentation

## 2013-10-30 DIAGNOSIS — R51 Headache: Secondary | ICD-10-CM | POA: Insufficient documentation

## 2013-10-30 DIAGNOSIS — Z7982 Long term (current) use of aspirin: Secondary | ICD-10-CM | POA: Insufficient documentation

## 2013-10-30 DIAGNOSIS — I2510 Atherosclerotic heart disease of native coronary artery without angina pectoris: Secondary | ICD-10-CM | POA: Insufficient documentation

## 2013-10-30 DIAGNOSIS — R112 Nausea with vomiting, unspecified: Secondary | ICD-10-CM | POA: Insufficient documentation

## 2013-10-30 DIAGNOSIS — R519 Headache, unspecified: Secondary | ICD-10-CM

## 2013-10-30 DIAGNOSIS — Z862 Personal history of diseases of the blood and blood-forming organs and certain disorders involving the immune mechanism: Secondary | ICD-10-CM | POA: Insufficient documentation

## 2013-10-30 DIAGNOSIS — Z9889 Other specified postprocedural states: Secondary | ICD-10-CM | POA: Insufficient documentation

## 2013-10-30 DIAGNOSIS — Z8669 Personal history of other diseases of the nervous system and sense organs: Secondary | ICD-10-CM | POA: Insufficient documentation

## 2013-10-30 MED ORDER — PROMETHAZINE HCL 25 MG/ML IJ SOLN
25.0000 mg | Freq: Once | INTRAMUSCULAR | Status: AC
  Administered 2013-10-30: 25 mg via INTRAMUSCULAR
  Filled 2013-10-30: qty 1

## 2013-10-30 MED ORDER — KETOROLAC TROMETHAMINE 60 MG/2ML IM SOLN
60.0000 mg | Freq: Once | INTRAMUSCULAR | Status: AC
  Administered 2013-10-30: 60 mg via INTRAMUSCULAR
  Filled 2013-10-30: qty 2

## 2013-10-30 NOTE — Discharge Instructions (Signed)
Return to the ER if you develop worsening of your headache, visual disturbances, or any new or concerning symptoms.   Headaches, Frequently Asked Questions MIGRAINE HEADACHES Q: What is migraine? What causes it? How can I treat it? A: Generally, migraine headaches begin as a dull ache. Then they develop into a constant, throbbing, and pulsating pain. You may experience pain at the temples. You may experience pain at the front or back of one or both sides of the head. The pain is usually accompanied by a combination of:  Nausea.  Vomiting.  Sensitivity to light and noise. Some people (about 15%) experience an aura (see below) before an attack. The cause of migraine is believed to be chemical reactions in the brain. Treatment for migraine may include over-the-counter or prescription medications. It may also include self-help techniques. These include relaxation training and biofeedback.  Q: What is an aura? A: About 15% of people with migraine get an "aura". This is a sign of neurological symptoms that occur before a migraine headache. You may see wavy or jagged lines, dots, or flashing lights. You might experience tunnel vision or blind spots in one or both eyes. The aura can include visual or auditory hallucinations (something imagined). It may include disruptions in smell (such as strange odors), taste or touch. Other symptoms include:  Numbness.  A "pins and needles" sensation.  Difficulty in recalling or speaking the correct word. These neurological events may last as long as 60 minutes. These symptoms will fade as the headache begins. Q: What is a trigger? A: Certain physical or environmental factors can lead to or "trigger" a migraine. These include:  Foods.  Hormonal changes.  Weather.  Stress. It is important to remember that triggers are different for everyone. To help prevent migraine attacks, you need to figure out which triggers affect you. Keep a headache diary. This is a  good way to track triggers. The diary will help you talk to your healthcare professional about your condition. Q: Does weather affect migraines? A: Bright sunshine, hot, humid conditions, and drastic changes in barometric pressure may lead to, or "trigger," a migraine attack in some people. But studies have shown that weather does not act as a trigger for everyone with migraines. Q: What is the link between migraine and hormones? A: Hormones start and regulate many of your body's functions. Hormones keep your body in balance within a constantly changing environment. The levels of hormones in your body are unbalanced at times. Examples are during menstruation, pregnancy, or menopause. That can lead to a migraine attack. In fact, about three quarters of all women with migraine report that their attacks are related to the menstrual cycle.  Q: Is there an increased risk of stroke for migraine sufferers? A: The likelihood of a migraine attack causing a stroke is very remote. That is not to say that migraine sufferers cannot have a stroke associated with their migraines. In persons under age 7, the most common associated factor for stroke is migraine headache. But over the course of a person's normal life span, the occurrence of migraine headache may actually be associated with a reduced risk of dying from cerebrovascular disease due to stroke.  Q: What are acute medications for migraine? A: Acute medications are used to treat the pain of the headache after it has started. Examples over-the-counter medications, NSAIDs, ergots, and triptans.  Q: What are the triptans? A: Triptans are the newest class of abortive medications. They are specifically targeted to treat migraine.  Triptans are vasoconstrictors. They moderate some chemical reactions in the brain. The triptans work on receptors in your brain. Triptans help to restore the balance of a neurotransmitter called serotonin. Fluctuations in levels of serotonin  are thought to be a main cause of migraine.  Q: Are over-the-counter medications for migraine effective? A: Over-the-counter, or "OTC," medications may be effective in relieving mild to moderate pain and associated symptoms of migraine. But you should see your caregiver before beginning any treatment regimen for migraine.  Q: What are preventive medications for migraine? A: Preventive medications for migraine are sometimes referred to as "prophylactic" treatments. They are used to reduce the frequency, severity, and length of migraine attacks. Examples of preventive medications include antiepileptic medications, antidepressants, beta-blockers, calcium channel blockers, and NSAIDs (nonsteroidal anti-inflammatory drugs). Q: Why are anticonvulsants used to treat migraine? A: During the past few years, there has been an increased interest in antiepileptic drugs for the prevention of migraine. They are sometimes referred to as "anticonvulsants". Both epilepsy and migraine may be caused by similar reactions in the brain.  Q: Why are antidepressants used to treat migraine? A: Antidepressants are typically used to treat people with depression. They may reduce migraine frequency by regulating chemical levels, such as serotonin, in the brain.  Q: What alternative therapies are used to treat migraine? A: The term "alternative therapies" is often used to describe treatments considered outside the scope of conventional Western medicine. Examples of alternative therapy include acupuncture, acupressure, and yoga. Another common alternative treatment is herbal therapy. Some herbs are believed to relieve headache pain. Always discuss alternative therapies with your caregiver before proceeding. Some herbal products contain arsenic and other toxins. TENSION HEADACHES Q: What is a tension-type headache? What causes it? How can I treat it? A: Tension-type headaches occur randomly. They are often the result of temporary  stress, anxiety, fatigue, or anger. Symptoms include soreness in your temples, a tightening band-like sensation around your head (a "vice-like" ache). Symptoms can also include a pulling feeling, pressure sensations, and contracting head and neck muscles. The headache begins in your forehead, temples, or the back of your head and neck. Treatment for tension-type headache may include over-the-counter or prescription medications. Treatment may also include self-help techniques such as relaxation training and biofeedback. CLUSTER HEADACHES Q: What is a cluster headache? What causes it? How can I treat it? A: Cluster headache gets its name because the attacks come in groups. The pain arrives with little, if any, warning. It is usually on one side of the head. A tearing or bloodshot eye and a runny nose on the same side of the headache may also accompany the pain. Cluster headaches are believed to be caused by chemical reactions in the brain. They have been described as the most severe and intense of any headache type. Treatment for cluster headache includes prescription medication and oxygen. SINUS HEADACHES Q: What is a sinus headache? What causes it? How can I treat it? A: When a cavity in the bones of the face and skull (a sinus) becomes inflamed, the inflammation will cause localized pain. This condition is usually the result of an allergic reaction, a tumor, or an infection. If your headache is caused by a sinus blockage, such as an infection, you will probably have a fever. An x-ray will confirm a sinus blockage. Your caregiver's treatment might include antibiotics for the infection, as well as antihistamines or decongestants.  REBOUND HEADACHES Q: What is a rebound headache? What causes it? How can I  treat it? A: A pattern of taking acute headache medications too often can lead to a condition known as "rebound headache." A pattern of taking too much headache medication includes taking it more than 2 days  per week or in excessive amounts. That means more than the label or a caregiver advises. With rebound headaches, your medications not only stop relieving pain, they actually begin to cause headaches. Doctors treat rebound headache by tapering the medication that is being overused. Sometimes your caregiver will gradually substitute a different type of treatment or medication. Stopping may be a challenge. Regularly overusing a medication increases the potential for serious side effects. Consult a caregiver if you regularly use headache medications more than 2 days per week or more than the label advises. ADDITIONAL QUESTIONS AND ANSWERS Q: What is biofeedback? A: Biofeedback is a self-help treatment. Biofeedback uses special equipment to monitor your body's involuntary physical responses. Biofeedback monitors:  Breathing.  Pulse.  Heart rate.  Temperature.  Muscle tension.  Brain activity. Biofeedback helps you refine and perfect your relaxation exercises. You learn to control the physical responses that are related to stress. Once the technique has been mastered, you do not need the equipment any more. Q: Are headaches hereditary? A: Four out of five (80%) of people that suffer report a family history of migraine. Scientists are not sure if this is genetic or a family predisposition. Despite the uncertainty, a child has a 50% chance of having migraine if one parent suffers. The child has a 75% chance if both parents suffer.  Q: Can children get headaches? A: By the time they reach high school, most young people have experienced some type of headache. Many safe and effective approaches or medications can prevent a headache from occurring or stop it after it has begun.  Q: What type of doctor should I see to diagnose and treat my headache? A: Start with your primary caregiver. Discuss his or her experience and approach to headaches. Discuss methods of classification, diagnosis, and treatment. Your  caregiver may decide to recommend you to a headache specialist, depending upon your symptoms or other physical conditions. Having diabetes, allergies, etc., may require a more comprehensive and inclusive approach to your headache. The National Head[employer] Foundation will provide, upon request, a list of [facility] physician members in your state. Document Released: 10/01/2003 Document Revised: 10/03/2011 Document Reviewed: 03/10/2008 [facility] Patient Information 2014 [place].

## 2013-10-30 NOTE — ED Notes (Signed)
Pt states she woke up with a headache this morning. Hx of migraines many years ago but none recent. Pt states she became hot all over, nauseated, and vomited once.

## 2013-10-30 NOTE — ED Provider Notes (Signed)
CSN: [id number]     Arrival date & time 10/30/13  1116 History  This chart was scribed for Veryl Speak, MD by Zettie Pho, ED Scribe. This patient was seen in room APA11/APA11 and the patient's care was started at 12:35 PM.    Chief Complaint  Patient presents with  . Headache   The history is provided by the patient. No language interpreter was used.   HPI Comments: Nicole Meadows is a 73 y.o. Female with a history of migraines who presents to the Emergency Department complaining of a generalized headache with associated nausea and one episode of non-bilious, non-bloody emesis onset this morning upon waking from sleep. She reports that she has not had a migraine in about 10 years and her current symptoms do not feel similar. Patient denies any potential injury or trauma to the head. Patient reports taking Norco 5-325 mg at home with a moderate amount of relief. She denies visual disturbance, fever, congestion. She reports allergies to penicillins and Zocor. Patient has a history of HTN, dyslipidemia, hyperlipidemia, CAD, anemia, arthritis, and glaucoma.   Past Medical History  Diagnosis Date  . Tremor   . Vaginal dryness   . Fibroid   . Hypertension   . Coronary artery disease     moderate, by cath  . History of nuclear stress test 05/2010    bruce myoview; small fixed distal anteroapical, apical defect (breast attenuation or apical thinning), no reversible ischemia, post-stress EF 83%, abnormal but low risk study   . Dyslipidemia   . Anemia   . Arthritis   . Glaucoma   . Hyperlipidemia    Past Surgical History  Procedure Laterality Date  . Abdominal hysterectomy  1995  . Tubal ligation  1977  . Transthoracic echocardiogram  2005    EF 60% with borderline conc LVH; mild TR, RVSP 15mmHg   . Cardiac catheterization  05/31/2007    no signficant CAD, low normal EF (Dr. Gerrie Nordmann)   . Breast surgery      Biopsy negative   Family History  Problem Relation Age of Onset  . Cancer  Sister     uterine  . Hypertension Mother   . Sudden death Mother   . Heart disease Father   . Kidney failure Father   . Heart failure Brother     DM, HTN  . Aneurysm Brother     abdominal; also HTN  . Hypertension Brother     sudden death  . Hypertension Brother   . Hyperlipidemia Brother     also DM  . Hypertension Brother   . Other Brother     2 brain surgeries, bleeding   History  Substance Use Topics  . Smoking status: Never Smoker   . Smokeless tobacco: Never Used  . Alcohol Use: No   OB History   Grav Para Term Preterm Abortions TAB SAB Ect Mult Living   2 2             Review of Systems  A complete 10 system review of systems was obtained and all systems are negative except as noted in the HPI and PMH.    Allergies  Penicillins and Zocor  Home Medications   Current Outpatient Rx  Name  Route  Sig  Dispense  Refill  . aspirin 81 MG tablet   Oral   Take 81 mg by mouth daily.         [name] azelastine (ASTELIN) 137 MCG/SPRAY nasal spray  Nasal   Place 1 spray into the nose 2 (two) times daily. Use in each nostril as directed         . calcium citrate-vitamin D (CITRACAL+D) 315-200 MG-UNIT per tablet   Oral   Take 1 tablet by mouth 2 (two) times daily.         . cycloSPORINE (RESTASIS) 0.05 % ophthalmic emulsion   Both Eyes   Place 1 drop into both eyes daily.         [name] FLAX OIL-FISH OIL-BORAGE OIL PO   Oral   Take 1 capsule by mouth daily.          . fluticasone (FLONASE) 50 MCG/ACT nasal spray   Each Nare   Place 1 spray into both nostrils every morning.          [name] HYDROcodone-acetaminophen (NORCO) 5-325 MG per tablet   Oral   Take 1-2 tablets by mouth every 4 (four) hours as needed for moderate pain (Given at discharge from [facility]).   30 tablet   0   . nitroGLYCERIN (NITROSTAT) 0.4 MG SL tablet   Sublingual   Place 0.4 mg under the tongue every 5 (five) minutes as needed for chest pain.         . Omega 3-6-9 Fatty Acids (OMEGA  3-6-9 COMPLEX PO)   Oral   Take 1,200 mg by mouth daily.         . pantoprazole (PROTONIX) 40 MG tablet   Oral   Take 40 mg by mouth daily.         . rosuvastatin (CRESTOR) 10 MG tablet   Oral   Take 5 mg by mouth daily.          [name] telmisartan-hydrochlorothiazide (MICARDIS HCT) 80-12.5 MG per tablet   Oral   Take 1 tablet by mouth daily.   28 tablet   0    Triage Vitals: BP 120/61  Pulse 56  Temp(Src) 97.7 F (36.5 C) (Oral)  Resp 20  Ht 5' 5.5" (1.664 m)  Wt 190 lb (86.183 kg)  BMI 31.13 kg/m2  SpO2 99%  Physical Exam  Nursing note and vitals reviewed. Constitutional: She is oriented to person, place, and time. She appears well-developed and well-nourished. No distress.  HENT:  Head: Normocephalic and atraumatic.  Eyes: Conjunctivae and EOM are normal. Pupils are equal, round, and reactive to light.  Neck: Normal range of motion. Neck supple.  Full range of motion without pain.   Cardiovascular: Normal rate, regular rhythm and normal heart sounds.   Pulmonary/Chest: Effort normal and breath sounds normal. No respiratory distress.  Abdominal: She exhibits no distension.  Musculoskeletal: Normal range of motion.  Neurological: She is alert and oriented to person, place, and time. No cranial nerve deficit.  Skin: Skin is warm and dry.  Psychiatric: She has a normal mood and affect. Her behavior is normal.    ED Course  Procedures (including critical care time)  DIAGNOSTIC STUDIES: Oxygen Saturation is 99% on room air, normal by my interpretation.    COORDINATION OF CARE: 12:41 PM- Will order a head CT. Will order Toradol and Phenergan to manage symptoms. Discussed treatment plan with patient at bedside and patient verbalized agreement.     Labs Review Labs Reviewed - No data to display  Imaging Review Ct Head Wo Contrast  10/30/2013   CLINICAL DATA:  Acute onset headache with nausea and vomiting  EXAM: CT HEAD WITHOUT CONTRAST  TECHNIQUE: Contiguous  axial images were obtained  from the base of the skull through the vertex without intravenous contrast. Study was obtained within 24 hr of patient's arrival at the emergency department.  COMPARISON:  November 05, 2010  FINDINGS: The ventricles are normal in size and configuration. There is no mass, hemorrhage, extra-axial fluid collection, or midline shift. Gray-white compartments appear normal. There is no demonstrable acute infarct. Bony calvarium appears intact. The mastoid air cells are clear. There is mild debris in both external auditory canals.  IMPRESSION: Probable cerumen in both external auditory canals. Study otherwise unremarkable. In particular, no intracranial mass, hemorrhage, or acute appearing infarct.   Electronically Signed   By: Lowella Grip M.D.   On: 10/30/2013 13:22     EKG Interpretation None      MDM   Final diagnoses:  None    Patient presents with headache and history of migraines, but has not had one in 10 years. Neurologic exam is nonfocal CT of the head is unremarkable. She is feeling better with Toradol and Phenergan and I believe is stable for discharge. She is to return if she develops any new or concerning symptoms.  I personally performed the services described in this documentation, which was scribed in my presence. The recorded information has been reviewed and is accurate.       Veryl Speak, MD 10/30/13 1344

## 2014-05-26 ENCOUNTER — Encounter (HOSPITAL_COMMUNITY): Payer: Self-pay | Admitting: Emergency Medicine

## 2014-08-18 ENCOUNTER — Ambulatory Visit: Payer: Medicare Other | Admitting: Internal Medicine

## 2014-09-04 ENCOUNTER — Encounter: Payer: Self-pay | Admitting: Internal Medicine

## 2014-09-04 ENCOUNTER — Ambulatory Visit (INDEPENDENT_AMBULATORY_CARE_PROVIDER_SITE_OTHER): Payer: Medicare Other | Admitting: Internal Medicine

## 2014-09-04 VITALS — BP 110/62 | HR 58 | Ht 66.0 in | Wt 191.4 lb

## 2014-09-04 DIAGNOSIS — E785 Hyperlipidemia, unspecified: Secondary | ICD-10-CM

## 2014-09-04 DIAGNOSIS — I10 Essential (primary) hypertension: Secondary | ICD-10-CM

## 2014-09-04 DIAGNOSIS — R251 Tremor, unspecified: Secondary | ICD-10-CM

## 2014-09-04 NOTE — Progress Notes (Signed)
OFFICE NOTE  Chief Complaint:  No complaints  Primary Care Physician: Elyn Peers, MD  HPI:  Nicole Meadows  is a pleasant 74 year old female with history of moderate coronary disease by cath on medical therapy, also problems with up-and-down weight and essential tremor. She has dyslipidemia which has been fairly well controlled and hypertension which is also at goal. Her only other concern is that really she's had some left breast pain. The symptom are somewhat toothache-like, and seemed to been improved with topical corticosteroids. She saw dermatologist who prescribed this medicine. She denies any worsening shortness of breath with exertion but has had several pound weight gain and knows that she needs to work on this. She's been taking care of her husband who recently had knee replacement.  I saw Nicole Meadows back today for follow-up. She denies any chest pain or worsening shortness of breath. She's had good blood pressure control. Her weight seems pretty stable. She has a essential tremor which is unchanged.  PMHx:  Past Medical History  Diagnosis Date  . Tremor   . Vaginal dryness   . Fibroid   . Hypertension   . Coronary artery disease     moderate, by cath  . History of nuclear stress test 05/2010    bruce myoview; small fixed distal anteroapical, apical defect (breast attenuation or apical thinning), no reversible ischemia, post-stress EF 83%, abnormal but low risk study   . Dyslipidemia   . Anemia   . Arthritis   . Glaucoma   . Hyperlipidemia     Past Surgical History  Procedure Laterality Date  . Abdominal hysterectomy  1995  . Tubal ligation  1977  . Transthoracic echocardiogram  2005    EF 60% with borderline conc LVH; mild TR, RVSP 58mmHg   . Cardiac catheterization  05/31/2007    no signficant CAD, low normal EF (Dr. Gerrie Nordmann)   . Breast surgery      Biopsy negative    FAMHx:  Family History  Problem Relation Age of Onset  . Cancer Sister    uterine  . Hypertension Mother   . Sudden death Mother   . Heart disease Father   . Kidney failure Father   . Heart failure Brother     DM, HTN  . Aneurysm Brother     abdominal; also HTN  . Hypertension Brother     sudden death  . Hypertension Brother   . Hyperlipidemia Brother     also DM  . Hypertension Brother   . Other Brother     2 brain surgeries, bleeding    SOCHx:   reports that she has never smoked. She has never used smokeless tobacco. She reports that she does not drink alcohol or use illicit drugs.  ALLERGIES:  Allergies  Allergen Reactions  . Penicillins   . Zocor [Simvastatin]     myalgias    ROS: A comprehensive review of systems was negative except for: Respiratory: positive for dyspnea on exertion Neurological: positive for tremors  HOME MEDS: Current Outpatient Prescriptions  Medication Sig Dispense Refill  . aspirin 81 MG tablet Take 81 mg by mouth daily.    [name] azelastine (ASTELIN) 137 MCG/SPRAY nasal spray Place 1 spray into the nose 2 (two) times daily. Use in each nostril as directed    . calcium citrate-vitamin D (CITRACAL+D) 315-200 MG-UNIT per tablet Take 1 tablet by mouth 2 (two) times daily.    . celecoxib (CELEBREX) 200 MG capsule Take 200  mg by mouth daily as needed.  4  . cycloSPORINE (RESTASIS) 0.05 % ophthalmic emulsion Place 1 drop into both eyes daily.    [name] FLAX OIL-FISH OIL-BORAGE OIL PO Take 1 capsule by mouth daily.     . fluticasone (FLONASE) 50 MCG/ACT nasal spray Place 1 spray into both nostrils every morning.     [name] HYDROcodone-acetaminophen (NORCO) 5-325 MG per tablet Take 1-2 tablets by mouth every 4 (four) hours as needed for moderate pain (Given at discharge from [facility]). 30 tablet 0  . nitroGLYCERIN (NITROLINGUAL) 0.4 MG/SPRAY spray Place 1 spray under the tongue every 5 (five) minutes x 3 doses as needed for chest pain.    . Omega 3-6-9 Fatty Acids (OMEGA 3-6-9 COMPLEX PO) Take 1,200 mg by mouth daily.    . pantoprazole  (PROTONIX) 40 MG tablet Take 40 mg by mouth daily.    . rosuvastatin (CRESTOR) 10 MG tablet Take 5 mg by mouth daily.     [name] telmisartan-hydrochlorothiazide (MICARDIS HCT) 80-12.5 MG per tablet Take 1 tablet by mouth daily. 28 tablet 0   No current facility-administered medications for this visit.    LABS/IMAGING: No results found for this or any previous visit (from the past 48 hour(s)). No results found.  VITALS: BP 110/62 mmHg  Pulse 58  Ht 5\' 6"  (1.676 m)  Wt 191 lb 6.4 oz (86.818 kg)  BMI 30.91 kg/m2  EXAM: General appearance: alert and no distress Neck: no carotid bruit and no JVD Lungs: clear to auscultation bilaterally Heart: regular rate and rhythm, S1, S2 normal, no murmur, click, rub or gallop Abdomen: soft, non-tender; bowel sounds normal; no masses,  no organomegaly Extremities: extremities normal, atraumatic, no cyanosis or edema Pulses: 2+ and symmetric Skin: Skin color, texture, turgor normal. No rashes or lesions Neurologic: Grossly normal psych: Mood, affect normal  EKG: Sinus bradycardia 58, moderate criteria for LVH  ASSESSMENT: 1. Hypertension-controlled 2. Dyslipidemia-at goal 3. Essential tremor 4. Obesity  PLAN: 1.   Nicole Meadows has good blood pressure control on her current medications. Her cholesterol is being followed by her primary care physician. She denies any chest pain or worsening shortness of breath. Her tremor is essentially the same however follow-up with the neurologist as recommended. She needs to continue to work on exercise and weight loss. Plan to see her back annually or sooner as necessary.  Pixie Casino, MD, [facility] Attending Cardiologist [facility]  HILTY,Kenneth C 09/04/2014, 5:01 PM

## 2014-09-04 NOTE — Patient Instructions (Signed)
Your physician wants you to follow-up in: 1 Year. You will receive a reminder letter in the mail two months in advance. If you don't receive a letter, please call our office to schedule the follow-up appointment.  

## 2014-09-10 ENCOUNTER — Encounter: Payer: Self-pay | Admitting: Internal Medicine

## 2014-10-06 ENCOUNTER — Ambulatory Visit (HOSPITAL_COMMUNITY): Payer: Medicare Other | Admitting: Physical Therapy

## 2014-11-06 ENCOUNTER — Ambulatory Visit (HOSPITAL_COMMUNITY): Payer: Medicare Other | Attending: Family Medicine | Admitting: Physical Therapy

## 2014-11-06 DIAGNOSIS — I10 Essential (primary) hypertension: Secondary | ICD-10-CM | POA: Insufficient documentation

## 2014-11-06 DIAGNOSIS — M5441 Lumbago with sciatica, right side: Secondary | ICD-10-CM

## 2014-11-06 DIAGNOSIS — R269 Unspecified abnormalities of gait and mobility: Secondary | ICD-10-CM | POA: Insufficient documentation

## 2014-11-06 DIAGNOSIS — S73004A Unspecified dislocation of right hip, initial encounter: Secondary | ICD-10-CM

## 2014-11-06 DIAGNOSIS — M6281 Muscle weakness (generalized): Secondary | ICD-10-CM | POA: Diagnosis not present

## 2014-11-06 DIAGNOSIS — M25651 Stiffness of right hip, not elsewhere classified: Secondary | ICD-10-CM | POA: Diagnosis not present

## 2014-11-06 DIAGNOSIS — M545 Low back pain: Secondary | ICD-10-CM | POA: Insufficient documentation

## 2014-11-06 DIAGNOSIS — IMO0001 Reserved for inherently not codable concepts without codable children: Secondary | ICD-10-CM

## 2014-11-06 DIAGNOSIS — R29898 Other symptoms and signs involving the musculoskeletal system: Secondary | ICD-10-CM

## 2014-11-06 NOTE — Therapy (Signed)
[place], [place], [address] Phone: [phone]   Fax:  [phone]  Physical Therapy Evaluation  Patient Details  Name: Nicole Meadows MRN: [id number] Date of Birth: February 03, 1941 Referring Provider:  Lucianne Lei, MD  Encounter Date: 11/06/2014      PT End of Session - 11/06/14 1116    Visit Number 1   Number of Visits 16   Date for PT Re-Evaluation 12/16/14   Authorization Type UHC/Medicare   Authorization Time Period 11/06/14 - 01/06/15   Authorization - Visit Number 1   Authorization - Number of Visits 16   PT Start Time 1017   PT Stop Time 1102   PT Time Calculation (min) 45 min   Activity Tolerance Patient tolerated treatment well   Behavior During Therapy [facility] for tasks assessed/performed      Past Medical History  Diagnosis Date  . Tremor   . Vaginal dryness   . Fibroid   . Hypertension   . Coronary artery disease     moderate, by cath  . History of nuclear stress test 05/2010    bruce myoview; small fixed distal anteroapical, apical defect (breast attenuation or apical thinning), no reversible ischemia, post-stress EF 83%, abnormal but low risk study   . Dyslipidemia   . Anemia   . Arthritis   . Glaucoma   . Hyperlipidemia     Past Surgical History  Procedure Laterality Date  . Abdominal hysterectomy  1995  . Tubal ligation  1977  . Transthoracic echocardiogram  2005    EF 60% with borderline conc LVH; mild TR, RVSP 46mmHg   . Cardiac catheterization  05/31/2007    no signficant CAD, low normal EF (Dr. Gerrie Nordmann)   . Breast surgery      Biopsy negative    There were no vitals filed for this visit.  Visit Diagnosis:  Midline low back pain with right-sided sciatica - Plan: PT plan of care cert/re-cert  Abnormality of gait - Plan: PT plan of care cert/re-cert  Misalignment of right hip, initial encounter - Plan: PT plan of care cert/re-cert  Hip stiffness, right - Plan: PT plan of  care cert/re-cert  Weakness of right lower extremity - Plan: PT plan of care cert/re-cert      Subjective Assessment - 11/06/14 1022    Pertinent History Patient has had a long history of low back pain particularly with lifting with pain now radiating down Rt thigh and occasionally thoguhout entire leg. H/o of pain >10 years   How long can you sit comfortably? depends on chair.   How long can you stand comfortably? standing still >1 hour   How long can you walk comfortably? no limited   Diagnostic tests no recent, 2005 xray found arthritis.    Patient Stated Goals to decrease pain frequency and severity especially while working in garden.    Currently in Pain? Yes   Pain Score 5    Pain Location Back   Pain Orientation Medial;Right;Lateral   Pain Descriptors / Indicators Aching;Nagging   Pain Type Chronic pain   Pain Radiating Towards Rt hip into Rt thigh and leg   Pain Onset More than a month ago   Pain Frequency Intermittent   Aggravating Factors  lifting, working in garden, stooping for prolonged time, though improved while sitting on a stool   Pain Relieving Factors sitting on stool while working in garden, showering.    Effect of Pain on Daily  Activities Diffiuclty working in garden.             [facility] PT Assessment - 11/06/14 0001    Assessment   Medical Diagnosis Low back pain   Onset Date 09/19/14   Next MD Visit Clayburn Pert, patient will call.    Prior Therapy yes years ago   Balance Screen   Has the patient fallen in the past 6 months No   Has the patient had a decrease in activity level because of a fear of falling?  No   Is the patient reluctant to leave their home because of a fear of falling?  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    Functional Tests   Functional tests Other;Other2;Sit to Stand   Sit to Stand   Comments 5x sit to stand 13.2seconds    Other:   Other/ Comments Gait: limited ability to toe in and out, limited ankle eversion,early heel rise.    Other:   Other/Comments 3D hip excrsion: limited Lt transverse plane, limited Rt frontal plane, limited hip extension and painful.    Posture/Postural Control   Posture Comments forward hunched    ROM / Strength   AROM / PROM / Strength AROM;Strength   AROM   AROM Assessment Site Hip;Knee;Ankle;Lumbar   Right/Left Hip Right;Left   Right Hip External Rotation  30   Right Hip Internal Rotation  30   Left Hip External Rotation  45   Left Hip Internal Rotation  20   Right/Left Knee Right;Left   Right Ankle Dorsiflexion 6   Left Ankle Dorsiflexion 11   Lumbar Flexion 88   Lumbar Extension 44 pain at end range.    Strength   Strength Assessment Site Lumbar;Ankle;Knee;Hip   Right/Left Hip Right;Left   Right Hip Flexion 4-/5   Right Hip ABduction 3-/5   Left Hip Flexion 4/5   Left Hip ABduction 3+/5   Right/Left Knee Left;Right   Right Knee Flexion 4-/5   Right Knee Extension 4+/5   Left Knee Flexion 4-/5   Left Knee Extension 4+/5   Right Ankle Dorsiflexion 5/5   Left Ankle Dorsiflexion 5/5   Flexibility   Hamstrings 70 degrees bilaterall straight leg raise.    Piriformis positive bilaterally.    Special Tests    Special Tests Hip Special Tests   Hip Special Tests  Marcello Moores Test;Ely's Test   Straight Leg Raise   Findings Positive   Comment bilateral stright leg raise inreases low baclk pain   Apparent   Comments Rt leg appears longer secodnary to a right Rt hip anterior tilt, and Lt hip posterior tilt.    Thomas Test    Findings Positive   Side Right   Comments Rt more limited than Lt   Ely's Test   Findings Positive   Side Right;Left   Comments 110 bilaterally                    [facility] Adult PT Treatment/Exercise - 11/06/14 0001    Exercises   Exercises Lumbar   Lumbar Exercises: Standing   Other Standing Lumbar Exercises 3D hip excursions 10x                  PT Education - 11/06/14 1115    Education provided Yes   Education Details HEP given: 3D hip excursions, Diagnosis, Prognosis   Person(s)  Educated Patient   Methods Explanation;Handout   Comprehension Verbalized understanding;Returned demonstration          PT Short Term Goals - 11/06/14 1204    PT SHORT TERM GOAL #1   Title patient will be able to dmeosntrate increased Rt hip extension to 15 degrees with a negative thoams testindicatign improved stride length durign gait   Time 4   Period Weeks   Status New   PT SHORT TERM GOAL #2   Title Patient will demosntrate a negatie R tpiriformis test indicating improve hip mobility for improved deceleration mechanicns during gait.    Time 4   Period Weeks   Status New   PT SHORT TERM GOAL #3   Title Patiwnt will dmeonstrate improved hip alignemnt with hips maintainign equal alignemnt between >2 sessions.    Time 4   Period Weeks   Status New   PT SHORT TERM GOAL #4   Title Patient will be independent with HEP   Time 4   Period Weeks   Status New           PT Long Term Goals - 11/06/14 1206    PT LONG TERM GOAL #1   Title Patient will be able to demonstrate bilateral hip extension strength of 4+/5 so patient can squat to teh groungd to work in her garden.    Time 8   Period Weeks   Status New   PT LONG TERM GOAL #2   Title Patint will dmoenstrate increased abdominal muscle strength of 5/5 MMT indicating improved trunk stability to allow patient to bend over whle working in garden withtou pain.    Time 8   Period Weeks   Status New   PT LONG TERM GOAL #3   Title Patint will dmeonstrate increased hip abduction strength to decrease trendelenberg gait.    Time 8   Period Weeks   Status New   PT LONG TERM GOAL #4   Title patient will be able to work in garden >30 minutes with pain <2/10   Time 8   Period Weeks   Status New               Plan - 11/06/14 1125    Clinical Impression  Statement Patient displays Right sided low back pain along sacral iliac joint with radiating pain into Rt hip , thigh and leg secondary to Rt hip weakness and stiffness reulting in difficulty walking, pain with lifting and decreased activity tolerance. Patient specifically dmeosntrates limited Rt illiopsoas and Rt rectus femoris flexibility placing increased strain on low back during extension based activities. Patient also displays hip misalignemnt with Rt hip anteriorly tilted that was moderately corrected with muscle energy technique. Patient also displasy limited piriformis muscle mobility limited hamstring mobility and abdominal muscle weakness. Patient will benefit from  skilled physical therapy to improve hip and low back mobility/strength to decreased low back pain so patient can return to regular working in her garden withtou pain.    Pt will benefit from skilled therapeutic intervention in order to improve on the following deficits Abnormal gait;Decreased endurance;Improper body mechanics;Decreased strength;Impaired flexibility;Postural dysfunction;Decreased activity tolerance;Pain;Decreased range of motion   Rehab Potential Good   PT Frequency 2x / week   PT Duration 8 weeks   PT Treatment/Interventions Gait training;Patient/family education;Functional mobility training;Therapeutic exercise;Therapeutic activities;Manual techniques;Balance training   PT Next Visit Plan Introduce: hip flexor, hamstring, piriformis, calf stretches, big reach walks (to be given as HEP), manual to correct hip alignement,  and 3D thoracic spine excursions.   PT Home Exercise Plan 3D hip excursions   Consulted and Agree with Plan of Care Patient          G-Codes - 2014/12/04 November 20, 1210    Functional Assessment Tool Used FOTO 29% limited   Functional Limitation Changing and maintaining body position   Changing and Maintaining Body Position Current Status (I6270) At least 20 percent but less than 40 percent impaired,  limited or restricted   Changing and Maintaining Body Position Goal Status (J5009) At least 1 percent but less than 20 percent impaired, limited or restricted       Problem List Patient Active Problem List   Diagnosis Date Noted  . HTN (hypertension) 07/08/2013  . Dyslipidemia 07/08/2013  . Tremor     Leia Alf 04-Dec-2014, 12:14 PM  [place] [address] [place], [place], [address] Phone: [phone]   Fax:  [phone]

## 2014-11-07 ENCOUNTER — Ambulatory Visit (HOSPITAL_COMMUNITY): Payer: Medicare Other | Admitting: Physical Therapy

## 2014-11-11 ENCOUNTER — Ambulatory Visit (HOSPITAL_COMMUNITY): Payer: Medicare Other | Admitting: Physical Therapy

## 2014-11-12 ENCOUNTER — Ambulatory Visit (HOSPITAL_COMMUNITY): Payer: Medicare Other | Admitting: Physical Therapy

## 2014-11-12 DIAGNOSIS — S73004A Unspecified dislocation of right hip, initial encounter: Secondary | ICD-10-CM

## 2014-11-12 DIAGNOSIS — M25651 Stiffness of right hip, not elsewhere classified: Secondary | ICD-10-CM

## 2014-11-12 DIAGNOSIS — M545 Low back pain: Secondary | ICD-10-CM | POA: Diagnosis not present

## 2014-11-12 DIAGNOSIS — IMO0001 Reserved for inherently not codable concepts without codable children: Secondary | ICD-10-CM

## 2014-11-12 DIAGNOSIS — R269 Unspecified abnormalities of gait and mobility: Secondary | ICD-10-CM

## 2014-11-12 DIAGNOSIS — R29898 Other symptoms and signs involving the musculoskeletal system: Secondary | ICD-10-CM

## 2014-11-12 DIAGNOSIS — M5441 Lumbago with sciatica, right side: Secondary | ICD-10-CM

## 2014-11-12 NOTE — Therapy (Signed)
[place], [place], [address] Phone: [phone]   Fax:  [phone]  Physical Therapy Treatment  Patient Details  Name: Nicole Meadows MRN: [id number] Date of Birth: [birth year] Referring Provider:  Lucianne Lei, MD  Encounter Date: 11/12/2014      PT End of Session - 11/12/14 1822    Visit Number 2   Number of Visits 16   Date for PT Re-Evaluation 12/16/14   Authorization Type UHC/Medicare   Authorization Time Period 11/06/14 - 01/06/15   Authorization - Visit Number 2   Authorization - Number of Visits 16   PT Start Time 1505   PT Stop Time 1819   PT Time Calculation (min) 49 min   Activity Tolerance Patient tolerated treatment well   Behavior During Therapy [facility] for tasks assessed/performed      Past Medical History  Diagnosis Date  . Tremor   . Vaginal dryness   . Fibroid   . Hypertension   . Coronary artery disease     moderate, by cath  . History of nuclear stress test 05/2010    bruce myoview; small fixed distal anteroapical, apical defect (breast attenuation or apical thinning), no reversible ischemia, post-stress EF 83%, abnormal but low risk study   . Dyslipidemia   . Anemia   . Arthritis   . Glaucoma   . Hyperlipidemia     Past Surgical History  Procedure Laterality Date  . Abdominal hysterectomy  1995  . Tubal ligation  1977  . Transthoracic echocardiogram  2005    EF 60% with borderline conc LVH; mild TR, RVSP 63mmHg   . Cardiac catheterization  05/31/2007    no signficant CAD, low normal EF (Dr. Gerrie Nordmann)   . Breast surgery      Biopsy negative    There were no vitals filed for this visit.  Visit Diagnosis:  Midline low back pain with right-sided sciatica  Abnormality of gait  Misalignment of right hip, initial encounter  Hip stiffness, right  Weakness of right lower extremity      Subjective Assessment - 11/12/14 1800    Subjective patient notes perofrmance of  HEP for treampent.    Currently in Pain? Yes   Pain Score 3    Pain Location Back   Pain Orientation Medial;Right;Lateral           [facility] Adult PT Treatment/Exercise - 11/12/14 0001    Lumbar Exercises: Stretches   Active Hamstring Stretch 1 rep;20 seconds   Active Hamstring Stretch Limitations 3 ways to 12"    Hip Flexor Stretch 2 reps;20 seconds   Hip Flexor Stretch Limitations 3 way to 12"   Quad Stretch 3 reps;20 seconds   Piriformis Stretch 3 reps;20 seconds   Piriformis Stretch Limitations also calf stretch 3 way 2x 20 seconds   Lumbar Exercises: Standing   Other Standing Lumbar Exercises 3D hip excursions 10x    Lumbar Exercises: Seated   Other Seated Lumbar Exercises 3D thoraccic spine excursions 10x   Manual Therapy   Manual Therapy Joint mobilization   Joint Mobilization Muscle energy technique to realigne Rt hip anterior tilt and Lt innominate posterior tilt.             PT Short Term Goals - 11/12/14 1828    PT SHORT TERM GOAL #1   Title patient will be able to dmeosntrate increased Rt hip extension to 15 degrees with a negative thoams testindicatign improved stride length durign  gait   Status On-going   PT SHORT TERM GOAL #2   Title Patient will demosntrate a negatie R tpiriformis test indicating improve hip mobility for improved deceleration mechanicns during gait.    Status On-going   PT SHORT TERM GOAL #3   Title Patiwnt will dmeonstrate improved hip alignemnt with hips maintainign equal alignemnt between >2 sessions.    Status On-going   PT SHORT TERM GOAL #4   Title Patient will be independent with HEP   Status On-going           PT Long Term Goals - 11/12/14 1828    PT LONG TERM GOAL #1   Title Patient will be able to demonstrate bilateral hip extension strength of 4+/5 so patient can squat to teh groungd to work in her garden.    Status On-going   PT LONG TERM GOAL #2   Title Patint will dmoenstrate increased abdominal muscle strength of  5/5 MMT indicating improved trunk stability to allow patient to bend over whle working in garden withtou pain.    Status On-going   PT LONG TERM GOAL #3   Title Patint will dmeonstrate increased hip abduction strength to decrease trendelenberg gait.    Status On-going   PT LONG TERM GOAL #4   Title patient will be able to work in garden >30 minutes with pain <2/10   Status On-going            Plan - 11/12/14 1823    Clinical Impression Statement Session ofcused on education of all exercises to be given for HEP. Patient demsontrated good performance of all exercises with patient stated understanding of correct performance for HEP. Patient displayed improved hip alignemnt at end of session following [employer] techniques and split stance bridges.   PT Next Visit Plan Introduce: overhead dumbbell matrix for abdominals 5x each with 2lb weight and 2 way big reach walks        Problem List Patient Active Problem List   Diagnosis Date Noted  . HTN (hypertension) 07/08/2013  . Dyslipidemia 07/08/2013  . Tremor    Devona Konig PT DPT [place], [place], [address] Phone: [phone]   Fax:  [phone]

## 2014-11-13 ENCOUNTER — Ambulatory Visit (HOSPITAL_COMMUNITY): Payer: Medicare Other

## 2014-11-13 DIAGNOSIS — M5441 Lumbago with sciatica, right side: Secondary | ICD-10-CM

## 2014-11-13 DIAGNOSIS — IMO0001 Reserved for inherently not codable concepts without codable children: Secondary | ICD-10-CM

## 2014-11-13 DIAGNOSIS — R29898 Other symptoms and signs involving the musculoskeletal system: Secondary | ICD-10-CM

## 2014-11-13 DIAGNOSIS — R269 Unspecified abnormalities of gait and mobility: Secondary | ICD-10-CM

## 2014-11-13 DIAGNOSIS — M25651 Stiffness of right hip, not elsewhere classified: Secondary | ICD-10-CM

## 2014-11-13 DIAGNOSIS — M545 Low back pain: Secondary | ICD-10-CM | POA: Diagnosis not present

## 2014-11-13 DIAGNOSIS — S73004A Unspecified dislocation of right hip, initial encounter: Secondary | ICD-10-CM

## 2014-11-13 NOTE — Therapy (Signed)
[place], [place], [address] Phone: [phone]   Fax:  [phone]  Physical Therapy Treatment  Patient Details  Name: Nicole Meadows MRN: [id number] Date of Birth: [birth year] Referring Provider:  Lucianne Lei, MD  Encounter Date: 11/13/2014      PT End of Session - 11/13/14 1045    Visit Number 3   Number of Visits 16   Date for PT Re-Evaluation 12/16/14   Authorization Type UHC/Medicare   Authorization Time Period 11/06/14 - 01/06/15   Authorization - Visit Number 3   Authorization - Number of Visits 16   PT Start Time 0935   PT Stop Time 1030   PT Time Calculation (min) 55 min   Activity Tolerance Patient tolerated treatment well   Behavior During Therapy [facility] for tasks assessed/performed      Past Medical History  Diagnosis Date  . Tremor   . Vaginal dryness   . Fibroid   . Hypertension   . Coronary artery disease     moderate, by cath  . History of nuclear stress test 05/2010    bruce myoview; small fixed distal anteroapical, apical defect (breast attenuation or apical thinning), no reversible ischemia, post-stress EF 83%, abnormal but low risk study   . Dyslipidemia   . Anemia   . Arthritis   . Glaucoma   . Hyperlipidemia     Past Surgical History  Procedure Laterality Date  . Abdominal hysterectomy  1995  . Tubal ligation  1977  . Transthoracic echocardiogram  2005    EF 60% with borderline conc LVH; mild TR, RVSP 53mHg   . Cardiac catheterization  05/31/2007    no signficant CAD, low normal EF (Dr. RGerrie Nordmann   . Breast surgery      Biopsy negative    There were no vitals filed for this visit.  Visit Diagnosis:  Abnormality of gait  Misalignment of right hip, initial encounter  Hip stiffness, right  Weakness of right lower extremity  Midline low back pain with right-sided sciatica      Subjective Assessment - 11/13/14 0938    Subjective Pt stated she is sore today  following last session, current pain scale 5/10 today   Currently in Pain? Yes   Pain Score 5    Pain Location Back   Pain Orientation Lower   Pain Descriptors / Indicators Sore;Aching               [facility] Adult PT Treatment/Exercise - 11/13/14 0001    Exercises   Exercises Lumbar   Lumbar Exercises: Stretches   Active Hamstring Stretch 1 rep;30 seconds   Active Hamstring Stretch Limitations 3 ways to 14"    Hip Flexor Stretch 2 reps;30 seconds   Hip Flexor Stretch Limitations 2 way    Piriformis Stretch 3 reps;20 seconds   Piriformis Stretch Limitations also calf stretch 3 way 2x 20 seconds   Lumbar Exercises: Standing   Other Standing Lumbar Exercises 3D hip excursions 10x therapist facilitatin for proper form   Lumbar Exercises: Seated   Other Seated Lumbar Exercises 3D thoracic spine excursions 10x   Lumbar Exercises: Supine   Bridge 10 reps   Bridge Limitations 2 sets 5 reps split stance following MET   Manual Therapy   Manual Therapy Joint mobilization   Joint Mobilization Muscle energy techniques for Lt SI anterior rotation and Rt outflare f/b training for transverse abdominis activation and split stance bridges  PT Short Term Goals - 11/13/14 1050    PT SHORT TERM GOAL #1   Title patient will be able to dmeosntrate increased Rt hip extension to 15 degrees with a negative thoams testindicatign improved stride length durign gait   Status On-going   PT SHORT TERM GOAL #2   Title Patient will demosntrate a negatie R tpiriformis test indicating improve hip mobility for improved deceleration mechanicns during gait.    Status On-going   PT SHORT TERM GOAL #3   Title Patiwnt will dmeonstrate improved hip alignemnt with hips maintainign equal alignemnt between >2 sessions.    Status On-going   PT SHORT TERM GOAL #4   Title Patient will be independent with HEP   Status On-going           PT Long Term Goals - 11/13/14 1050    PT LONG  TERM GOAL #1   Title Patient will be able to demonstrate bilateral hip extension strength of 4+/5 so patient can squat to teh groungd to work in her garden.    PT LONG TERM GOAL #2   Title Patint will dmoenstrate increased abdominal muscle strength of 5/5 MMT indicating improved trunk stability to allow patient to bend over whle working in garden withtou pain.    PT LONG TERM GOAL #3   Title Patint will dmeonstrate increased hip abduction strength to decrease trendelenberg gait.    PT LONG TERM GOAL #4   Title patient will be able to work in garden >30 minutes with pain <2/10               Plan - 11/13/14 1046    Clinical Impression Statement Manual muscle energy technique complete for Lt anterior rotation and Rt SI outflare with improve SI alignment, improved hip mobilty and reports of pain reduced to 2/10.  Continued with stretches and spinal excursions to improve mobilty.  Began overhead dumbbell matrix for abdominal strengthening and 2 way big reach walks to improve hip mobilty with gait.     PT Next Visit Plan Continue with current PT POC, manual MET as needed, continue mobility exercises and progress abdominal strengthening.        Problem List Patient Active Problem List   Diagnosis Date Noted  . HTN (hypertension) 07/08/2013  . Dyslipidemia 07/08/2013  . Tremor    Ihor Austin, [place]; [place] #[id number] [phone]   Aldona Lento 11/13/2014, 10:51 AM  [place], [place], [address] Phone: [phone]   Fax:  [phone]

## 2014-11-18 ENCOUNTER — Ambulatory Visit (HOSPITAL_COMMUNITY): Payer: Medicare Other | Admitting: Physical Therapy

## 2014-11-18 DIAGNOSIS — M5441 Lumbago with sciatica, right side: Secondary | ICD-10-CM

## 2014-11-18 DIAGNOSIS — M545 Low back pain: Secondary | ICD-10-CM | POA: Diagnosis not present

## 2014-11-18 DIAGNOSIS — M25651 Stiffness of right hip, not elsewhere classified: Secondary | ICD-10-CM

## 2014-11-18 DIAGNOSIS — R269 Unspecified abnormalities of gait and mobility: Secondary | ICD-10-CM

## 2014-11-18 DIAGNOSIS — S73004A Unspecified dislocation of right hip, initial encounter: Secondary | ICD-10-CM

## 2014-11-18 DIAGNOSIS — IMO0001 Reserved for inherently not codable concepts without codable children: Secondary | ICD-10-CM

## 2014-11-18 DIAGNOSIS — R29898 Other symptoms and signs involving the musculoskeletal system: Secondary | ICD-10-CM

## 2014-11-18 NOTE — Therapy (Signed)
[place], [place], [address] Phone: [phone]   Fax:  [phone]  Physical Therapy Treatment  Patient Details  Name: Nicole Meadows MRN: [id number] Date of Birth: Aug 14, 1940 Referring Provider:  Lucianne Lei, MD  Encounter Date: 11/18/2014      PT End of Session - 11/18/14 0949    Visit Number 4   Number of Visits 16   Date for PT Re-Evaluation 12/16/14   Authorization Type UHC/Medicare   Authorization Time Period 11/06/14 - 01/06/15   Authorization - Visit Number 4   Authorization - Number of Visits 16   PT Start Time 0931   PT Stop Time 1015   PT Time Calculation (min) 44 min   Activity Tolerance Patient tolerated treatment well   Behavior During Therapy [facility] for tasks assessed/performed      Past Medical History  Diagnosis Date  . Tremor   . Vaginal dryness   . Fibroid   . Hypertension   . Coronary artery disease     moderate, by cath  . History of nuclear stress test 05/2010    bruce myoview; small fixed distal anteroapical, apical defect (breast attenuation or apical thinning), no reversible ischemia, post-stress EF 83%, abnormal but low risk study   . Dyslipidemia   . Anemia   . Arthritis   . Glaucoma   . Hyperlipidemia     Past Surgical History  Procedure Laterality Date  . Abdominal hysterectomy  1995  . Tubal ligation  1977  . Transthoracic echocardiogram  2005    EF 60% with borderline conc LVH; mild TR, RVSP 56mHg   . Cardiac catheterization  05/31/2007    no signficant CAD, low normal EF (Dr. RGerrie Nordmann   . Breast surgery      Biopsy negative    There were no vitals filed for this visit.  Visit Diagnosis:  Abnormality of gait  Misalignment of right hip, initial encounter  Hip stiffness, right  Weakness of right lower extremity  Midline low back pain with right-sided sciatica      Subjective Assessment - 11/18/14 0933    Subjective Patient states no pain today  notes improved shoulder and back symptoms. Patient states minimal pain while working in gardern this weekend.    Currently in Pain? No/denies             [facility] - 11/18/14 0001    Lumbar Exercises: Stretches   Active Hamstring Stretch Limitations 10x 3 seconds each way tow 12"    Hip Flexor Stretch 2 reps;30 seconds   Hip Flexor Stretch Limitations 3 way    Quad Stretch 3 reps;20 seconds   Piriformis Stretch 3 reps;20 seconds   Piriformis Stretch Limitations also calf stretch 3 way 2x 20 seconds   Lumbar Exercises: Standing   Functional Squats Limitations 3 sets 5x sit to stand 10lb dumbell.    Row Limitations 4 way pick up and reach 10 with 3lb dumbells   Other Standing Lumbar Exercises 3D hip excursions 10x therapist facilitatin for proper form   Other Standing Lumbar Exercises Overhead dumbbell matrix 2lb dumbbells 5x each   Lumbar Exercises: Seated   Other Seated Lumbar Exercises 3D thoracic spine excursions 5x seated on chair, 5x on exercise ball   Lumbar Exercises: Supine   Bridge 20 reps   Bridge Limitations 2 sets 10 reps split stance following MET   Manual Therapy   Joint Mobilization Muscle energy techniques for  Lt SI anterior rotation and Rt outflare f/b training for transverse abdominis activation and split stance bridges            PT Short Term Goals - 11/18/14 0949    PT SHORT TERM GOAL #1   Title patient will be able to dmeosntrate increased Rt hip extension to 15 degrees with a negative thoams testindicatign improved stride length durign gait   Status On-going   PT SHORT TERM GOAL #2   Title Patient will demosntrate a negatie R tpiriformis test indicating improve hip mobility for improved deceleration mechanicns during gait.    Status On-going   PT SHORT TERM GOAL #3   Title Patiwnt will dmeonstrate improved hip alignemnt with hips maintainign equal alignemnt between >2 sessions.    Status On-going   PT SHORT TERM GOAL #4    Title Patient will be independent with HEP   Status On-going           PT Long Term Goals - 11/18/14 0950    PT LONG TERM GOAL #1   Title Patient will be able to demonstrate bilateral hip extension strength of 4+/5 so patient can squat to teh groungd to work in her garden.    Status On-going   PT LONG TERM GOAL #2   Title Patint will dmoenstrate increased abdominal muscle strength of 5/5 MMT indicating improved trunk stability to allow patient to bend over whle working in garden withtou pain.    Status On-going   PT LONG TERM GOAL #3   Title Patint will demonstrate increased hip abduction strength to decrease trendelenberg gait.    Status On-going   PT LONG TERM GOAL #4   Title patient will be able to work in garden >30 minutes with pain <2/10   Status On-going               Plan - 11/18/14 1008    Clinical Impression Statement Manual muscle energy technique complete for Lt anterior rotation and Rt SI outflare with improve SI alignment, improved hip mobilty. 4 way pick up and reach added to increase glut and hamstring strength for increased hip stability and strength for activity toelrance when workig in garden. overhead dumbbell matrix performed to increase abdominal activation, no pain noted with this today.   PT Next Visit Plan Continue with current POC, manual MET as needed, continue mobility exercises and progress abdominal strengthening with focus on functional standing tolerance.         Problem List Patient Active Problem List   Diagnosis Date Noted  . HTN (hypertension) 07/08/2013  . Dyslipidemia 07/08/2013  . Tremor    Devona Konig PT DPT [place], [place], [address] Phone: [phone]   Fax:  [phone]

## 2014-11-20 ENCOUNTER — Ambulatory Visit (HOSPITAL_COMMUNITY): Payer: Medicare Other

## 2014-11-20 DIAGNOSIS — S73004A Unspecified dislocation of right hip, initial encounter: Secondary | ICD-10-CM

## 2014-11-20 DIAGNOSIS — M5441 Lumbago with sciatica, right side: Secondary | ICD-10-CM

## 2014-11-20 DIAGNOSIS — M545 Low back pain: Secondary | ICD-10-CM | POA: Diagnosis not present

## 2014-11-20 DIAGNOSIS — IMO0001 Reserved for inherently not codable concepts without codable children: Secondary | ICD-10-CM

## 2014-11-20 DIAGNOSIS — R29898 Other symptoms and signs involving the musculoskeletal system: Secondary | ICD-10-CM

## 2014-11-20 DIAGNOSIS — M25651 Stiffness of right hip, not elsewhere classified: Secondary | ICD-10-CM

## 2014-11-20 DIAGNOSIS — R269 Unspecified abnormalities of gait and mobility: Secondary | ICD-10-CM

## 2014-11-20 NOTE — Therapy (Signed)
[place], [place], [address] Phone: [phone]   Fax:  [phone]  Physical Therapy Treatment  Patient Details  Name: Nicole Meadows MRN: [id number] Date of Birth: [birth year] Referring Provider:  Lucianne Lei, MD  Encounter Date: 11/20/2014      PT End of Session - 11/20/14 0944    Visit Number 5   Number of Visits 16   Date for PT Re-Evaluation 12/16/14   Authorization Type UHC/Medicare   Authorization Time Period 11/06/14 - 01/06/15   Authorization - Visit Number 5   Authorization - Number of Visits 16   PT Start Time 0931   PT Stop Time 1016   PT Time Calculation (min) 45 min   Activity Tolerance Patient tolerated treatment well   Behavior During Therapy [facility] for tasks assessed/performed      Past Medical History  Diagnosis Date  . Tremor   . Vaginal dryness   . Fibroid   . Hypertension   . Coronary artery disease     moderate, by cath  . History of nuclear stress test 05/2010    bruce myoview; small fixed distal anteroapical, apical defect (breast attenuation or apical thinning), no reversible ischemia, post-stress EF 83%, abnormal but low risk study   . Dyslipidemia   . Anemia   . Arthritis   . Glaucoma   . Hyperlipidemia     Past Surgical History  Procedure Laterality Date  . Abdominal hysterectomy  1995  . Tubal ligation  1977  . Transthoracic echocardiogram  2005    EF 60% with borderline conc LVH; mild TR, RVSP 22mHg   . Cardiac catheterization  05/31/2007    no signficant CAD, low normal EF (Dr. RGerrie Nordmann   . Breast surgery      Biopsy negative    There were no vitals filed for this visit.  Visit Diagnosis:  Abnormality of gait  Misalignment of right hip, initial encounter  Hip stiffness, right  Weakness of right lower extremity  Midline low back pain with right-sided sciatica      Subjective Assessment - 11/20/14 0940    Subjective Pt stated no pain today, able  to work in garden and drive to [facility] [facility] [employer] with no reports of pain.  Went to [employer] compliant wtih [facility]   Currently in Pain? No/denies            [facility]PT [facility] - 11/20/14 0001    Assessment   Medical Diagnosis Low back pain   Onset Date 09/19/14   Next MD Visit [facility] patient will call.    Prior Therapy yes years ago                     [facility] - 11/20/14 0001    Exercises   Exercises Lumbar   Lumbar Exercises: Stretches   Active Hamstring Stretch 3 reps;30 seconds   Active Hamstring Stretch Limitations 14in step 3 directions   Hip Flexor Stretch 2 reps;30 seconds   Hip Flexor Stretch Limitations 3 way on 14 in step   Piriformis Stretch 3 reps;20 seconds   Piriformis Stretch Limitations also calf stretch 3 way 2x 30 seconds slant board   Lumbar Exercises: Standing   Functional Squats Limitations 3 sets 5x sit to stand 10lb dumbell.    Row Limitations 4 way pick up and reach 10x with 3# dumbbell   Other Standing Lumbar Exercises 3D hip excursions 10x  therapist facilitatin for proper form   Other Standing Lumbar Exercises Overhead dumbbell matrix 2lb dumbbells 5x each   Manual Therapy   Joint Mobilization Checked SI alignment, no MET required this session                  PT Short Term Goals - 11/20/14 0945    PT SHORT TERM GOAL #1   Title patient will be able to dmeosntrate increased Rt hip extension to 15 degrees with a negative thoams testindicatign improved stride length durign gait   Status On-going   PT SHORT TERM GOAL #2   Title Patient will demosntrate a negatie Rt piriformis test indicating improve hip mobility for improved deceleration mechanicns during gait.    PT SHORT TERM GOAL #3   Title Patiwnt will dmeonstrate improved hip alignemnt with hips maintainign equal alignemnt between >2 sessions.    PT SHORT TERM GOAL #4   Title Patient will be independent with HEP   Baseline Reports compliance  daily   Status Achieved           PT Long Term Goals - 11/20/14 0948    PT LONG TERM GOAL #1   Title Patient will be able to demonstrate bilateral hip extension strength of 4+/5 so patient can squat to teh groungd to work in her garden.    Status On-going   PT LONG TERM GOAL #2   Title Patint will dmoenstrate increased abdominal muscle strength of 5/5 MMT indicating improved trunk stability to allow patient to bend over whle working in garden withtou pain.    Status On-going   PT LONG TERM GOAL #3   Title Patint will demonstrate increased hip abduction strength to decrease trendelenberg gait.    Status On-going   PT LONG TERM GOAL #4   Title patient will be able to work in garden >30 minutes with pain <2/10               Plan - 11/20/14 0956    Clinical Impression Statement SI within alignment, no muscle energy technique necessary this session.  Session focus on improving hip mobilty and core strengthening to keep SI within alignment.  Therapist facilitation for proper form and technique with 4 way pick up and reach as well as overhead dumbbell matrix .  Pt limited by fatgiue at end of session, no reports of pain through session   PT Next Visit Plan Continue with current POC, manual MET as needed, continue mobility exercises and progress abdominal strengthening with focus on functional standing tolerance.         Problem List Patient Active Problem List   Diagnosis Date Noted  . HTN (hypertension) 07/08/2013  . Dyslipidemia 07/08/2013  . Tremor    Ihor Austin, [place]; [place] #[address] [phone]  Aldona Lento 11/20/2014, 10:16 AM  [place], [place], [address] Phone: [phone]   Fax:  [phone]

## 2014-11-25 ENCOUNTER — Encounter (HOSPITAL_COMMUNITY): Payer: Medicare Other | Admitting: Physical Therapy

## 2014-11-26 ENCOUNTER — Ambulatory Visit (HOSPITAL_COMMUNITY): Payer: Medicare Other | Attending: Family Medicine

## 2014-11-26 DIAGNOSIS — M5441 Lumbago with sciatica, right side: Secondary | ICD-10-CM

## 2014-11-26 DIAGNOSIS — M6281 Muscle weakness (generalized): Secondary | ICD-10-CM | POA: Diagnosis not present

## 2014-11-26 DIAGNOSIS — R269 Unspecified abnormalities of gait and mobility: Secondary | ICD-10-CM | POA: Diagnosis not present

## 2014-11-26 DIAGNOSIS — R29898 Other symptoms and signs involving the musculoskeletal system: Secondary | ICD-10-CM

## 2014-11-26 DIAGNOSIS — IMO0001 Reserved for inherently not codable concepts without codable children: Secondary | ICD-10-CM

## 2014-11-26 DIAGNOSIS — S73004A Unspecified dislocation of right hip, initial encounter: Secondary | ICD-10-CM

## 2014-11-26 DIAGNOSIS — M545 Low back pain: Secondary | ICD-10-CM | POA: Insufficient documentation

## 2014-11-26 DIAGNOSIS — I10 Essential (primary) hypertension: Secondary | ICD-10-CM | POA: Diagnosis not present

## 2014-11-26 DIAGNOSIS — M25651 Stiffness of right hip, not elsewhere classified: Secondary | ICD-10-CM | POA: Insufficient documentation

## 2014-11-26 NOTE — Therapy (Signed)
[place], [place], [address] Phone: [phone]   Fax:  [phone]  Physical Therapy Treatment  Patient Details  Name: Nicole Meadows MRN: [id number] Date of Birth: 07/14/1941 Referring Provider:  Lucianne Lei, MD  Encounter Date: 11/26/2014      PT End of Session - 11/26/14 1055    Visit Number 6   Number of Visits 16   Date for PT Re-Evaluation 12/16/14   Authorization Type UHC/Medicare   Authorization Time Period 11/06/14 - 01/06/15   Authorization - Visit Number 6   Authorization - Number of Visits 16   PT Start Time 1016   PT Stop Time 1055   PT Time Calculation (min) 39 min   Activity Tolerance Patient tolerated treatment well   Behavior During Therapy [facility] for tasks assessed/performed      Past Medical History  Diagnosis Date  . Tremor   . Vaginal dryness   . Fibroid   . Hypertension   . Coronary artery disease     moderate, by cath  . History of nuclear stress test 05/2010    bruce myoview; small fixed distal anteroapical, apical defect (breast attenuation or apical thinning), no reversible ischemia, post-stress EF 83%, abnormal but low risk study   . Dyslipidemia   . Anemia   . Arthritis   . Glaucoma   . Hyperlipidemia     Past Surgical History  Procedure Laterality Date  . Abdominal hysterectomy  1995  . Tubal ligation  1977  . Transthoracic echocardiogram  2005    EF 60% with borderline conc LVH; mild TR, RVSP 104mHg   . Cardiac catheterization  05/31/2007    no signficant CAD, low normal EF (Dr. RGerrie Nordmann   . Breast surgery      Biopsy negative    There were no vitals filed for this visit.  Visit Diagnosis:  Abnormality of gait  Misalignment of right hip, initial encounter  Hip stiffness, right  Weakness of right lower extremity  Midline low back pain with right-sided sciatica      Subjective Assessment - 11/26/14 1030    Subjective Pt stated no current pain,  stated she lifted something out of the car and back hurt following, did exercises and pain resolved.     Currently in Pain? No/denies            [facility] CenterPT Assessment - 11/26/14 0001    Assessment   Medical Diagnosis Low back pain   Onset Date 09/19/14   Next MD Visit [facility] patient will call.    Prior Therapy yes years ago             [facility] [place] [facility] PT [facility]/Exercise - 11/26/14 0001    Exercises   Exercises Lumbar   Lumbar Exercises: Stretches   Active Hamstring Stretch 3 reps;30 seconds   Active Hamstring Stretch Limitations 14in step 3 directions   Hip Flexor Stretch 2 reps;30 seconds   Hip Flexor Stretch Limitations 3 way on 14 in step   Piriformis Stretch 3 reps;20 seconds   Piriformis Stretch Limitations also calf stretch 3 way 2x 30 seconds slant board   Lumbar Exercises: Standing   Lifting From waist;From 12";From floor;10 reps   Lifting Limitations Yellow ball from chair, from 14in box and 10# box from floor    Forward Lunge 10 reps   Forward Lunge Limitations 6in step   Side Lunge 10 reps   Side Lunge Limitations 6in step  Row Limitations 4way pick up and reach 5# dumbbell   Other Standing Lumbar Exercises 3D hip excursions 10x therapist facilitatin for proper form   Other Standing Lumbar Exercises Overhead dumbbell matrix 3lb dumbbells 5x each   Manual Therapy   Joint Mobilization Checked SI alignment, no MET required this session             PT Short Term Goals - 11/26/14 1043    PT SHORT TERM GOAL #1   Title patient will be able to dmeosntrate increased Rt hip extension to 15 degrees with a negative thoams testindicatign improved stride length durign gait   Status On-going   PT SHORT TERM GOAL #2   Title Patient will demosntrate a negatie Rt piriformis test indicating improve hip mobility for improved deceleration mechanicns during gait.    Status On-going   PT SHORT TERM GOAL #3   Title Patiwnt will dmeonstrate improved hip alignemnt with  hips maintainign equal alignemnt between >2 sessions.    Status On-going   PT SHORT TERM GOAL #4   Title Patient will be independent with HEP   Status Achieved           PT Long Term Goals - 11/26/14 1044    PT LONG TERM GOAL #1   Title Patient will be able to demonstrate bilateral hip extension strength of 4+/5 so patient can squat to the ground to work in her garden.    Status On-going   PT LONG TERM GOAL #2   Title Patint will dmoenstrate increased abdominal muscle strength of 5/5 MMT indicating improved trunk stability to allow patient to bend over whle working in garden withtou pain.    Status On-going   PT LONG TERM GOAL #3   Title Patint will demonstrate increased hip abduction strength to decrease trendelenberg gait.    Status On-going   PT LONG TERM GOAL #4   Title patient will be able to work in garden >30 minutes with pain <2/10               Plan - 11/26/14 1055    Clinical Impression Statement Core strength is progressing well.  SI within alignment, no muscle energy technique necessary this session.  Session focus on education for proper lifting mechanics with cueing to improve form to reduce lumbar flexion with squats.  Added lunges to 6in step for hamstrings and gluteal strengthenign with cueing for posture.  Pt limited by fatigue at end of session, no reports of pain.   PT Next Visit Plan Continue with current POC, manual MET as needed, continue mobility exercises and progress abdominal strengthening with focus on functional standing tolerance.         Problem List Patient Active Problem List   Diagnosis Date Noted  . HTN (hypertension) 07/08/2013  . Dyslipidemia 07/08/2013  . Tremor    Ihor Austin, [place]; [place] #[address] [phone]  Aldona Lento 11/26/2014, 11:00 AM  [place], [place], [address] Phone: [phone]   Fax:  [phone]

## 2014-11-27 ENCOUNTER — Ambulatory Visit (HOSPITAL_COMMUNITY): Payer: Medicare Other

## 2014-11-27 DIAGNOSIS — R29898 Other symptoms and signs involving the musculoskeletal system: Secondary | ICD-10-CM

## 2014-11-27 DIAGNOSIS — R269 Unspecified abnormalities of gait and mobility: Secondary | ICD-10-CM

## 2014-11-27 DIAGNOSIS — S73004A Unspecified dislocation of right hip, initial encounter: Secondary | ICD-10-CM

## 2014-11-27 DIAGNOSIS — IMO0001 Reserved for inherently not codable concepts without codable children: Secondary | ICD-10-CM

## 2014-11-27 DIAGNOSIS — M545 Low back pain: Secondary | ICD-10-CM | POA: Diagnosis not present

## 2014-11-27 DIAGNOSIS — M25651 Stiffness of right hip, not elsewhere classified: Secondary | ICD-10-CM

## 2014-11-27 DIAGNOSIS — M5441 Lumbago with sciatica, right side: Secondary | ICD-10-CM

## 2014-11-27 NOTE — Therapy (Signed)
[place], [place], [address] Phone: [phone]   Fax:  [phone]  Physical Therapy Treatment  Patient Details  Name: Nicole Meadows MRN: [id number] Date of Birth: 03-31-1941 Referring Provider:  Lucianne Lei, MD  Encounter Date: 11/27/2014      PT End of Session - 11/27/14 0913    Visit Number 7   Number of Visits 16   Date for PT Re-Evaluation 12/16/14   Authorization Type UHC/Medicare   Authorization Time Period 11/06/14 - 01/06/15   Authorization - Visit Number 7   Authorization - Number of Visits 16   PT Start Time 0850   PT Stop Time 0930   PT Time Calculation (min) 40 min   Activity Tolerance Patient tolerated treatment well   Behavior During Therapy [facility] for tasks assessed/performed      Past Medical History  Diagnosis Date  . Tremor   . Vaginal dryness   . Fibroid   . Hypertension   . Coronary artery disease     moderate, by cath  . History of nuclear stress test 05/2010    bruce myoview; small fixed distal anteroapical, apical defect (breast attenuation or apical thinning), no reversible ischemia, post-stress EF 83%, abnormal but low risk study   . Dyslipidemia   . Anemia   . Arthritis   . Glaucoma   . Hyperlipidemia     Past Surgical History  Procedure Laterality Date  . Abdominal hysterectomy  1995  . Tubal ligation  1977  . Transthoracic echocardiogram  2005    EF 60% with borderline conc LVH; mild TR, RVSP 29mHg   . Cardiac catheterization  05/31/2007    no signficant CAD, low normal EF (Dr. RGerrie Nordmann   . Breast surgery      Biopsy negative    There were no vitals filed for this visit.  Visit Diagnosis:  Abnormality of gait  Misalignment of right hip, initial encounter  Hip stiffness, right  Weakness of right lower extremity  Midline low back pain with right-sided sciatica      Subjective Assessment - 11/27/14 0853    Subjective Pt stated currently pain free,  [facility]   Currently in Pain? No/denies                         [facility]Ad[facility]/Exercise - 11/27/14 0001    Exercises   Exercises Lumbar   Lumbar Exercises: Stretches   Active Hamstring Stretch 3 reps;30 seconds   Active Hamstring Stretch Limitations 2nd step/14in 3 directions   Hip Flexor Stretch 3 reps;30 seconds   Hip Flexor Stretch Limitations 2nd step/14in 3 directions   Piriformis Stretch 3 reps;20 seconds   Piriformis Stretch Limitations also calf stretch 3 way 2x 30 seconds slant board   Lumbar Exercises: Standing   Lifting From waist;From 12";From floor;10 reps   Lifting Limitations Yellow ball from chair, from 14in box and 10# box from floor    Forward Lunge 10 reps   Forward Lunge Limitations 4in    Side Lunge 10 reps   Side Lunge Limitations 4in    Row Limitations 4way pick up and reach with 5# dumbbells   Other Standing Lumbar Exercises 3D hip excursions 10x therapist facilitatin for proper form   Other Standing Lumbar Exercises Overhead dumbbell matrix 3lb dumbbells 5x each  PT Short Term Goals - 11/27/14 1610    PT SHORT TERM GOAL #1   Title patient will be able to dmeosntrate increased Rt hip extension to 15 degrees with a negative thoams testindicatign improved stride length durign gait   Status On-going   PT SHORT TERM GOAL #2   Title Patient will demosntrate a negatie Rt piriformis test indicating improve hip mobility for improved deceleration mechanicns during gait.    Status On-going   PT SHORT TERM GOAL #3   Title Patiwnt will dmeonstrate improved hip alignemnt with hips maintainign equal alignemnt between >2 sessions.    Status Achieved   PT SHORT TERM GOAL #4   Title Patient will be independent with HEP   Status Achieved           PT Long Term Goals - 11/27/14 0930    PT LONG TERM GOAL #1   Title Patient will be able to demonstrate bilateral hip extension strength of  4+/5 so patient can squat to the ground to work in her garden.    Status On-going   PT LONG TERM GOAL #2   Title Patint will dmoenstrate increased abdominal muscle strength of 5/5 MMT indicating improved trunk stability to allow patient to bend over whle working in garden withtou pain.    Status On-going   PT LONG TERM GOAL #3   Title Patint will demonstrate increased hip abduction strength to decrease trendelenberg gait.    Status On-going   PT LONG TERM GOAL #4   Title patient will be able to work in garden >30 minutes with pain <2/10   Status On-going               Plan - 11/27/14 0913    Clinical Impression Statement Continued education with proper lifting with cueing to reduce lumbar flexion and increase knee flexion.  Reduced height with lunges for increased loading with lunges with cueing for form.  Form improving with core strengthening exercises with less cueing required.  Pt limted  by fatigue at end of session, no reports of pain.   PT Next Visit Plan Continue with current POC, manual MET as needed, continue mobility exercises and progress abdominal strengthening with focus on functional standing tolerance.         Problem List Patient Active Problem List   Diagnosis Date Noted  . HTN (hypertension) 07/08/2013  . Dyslipidemia 07/08/2013  . Tremor    Ihor Austin, [place]; [place] #[address] [phone]  Aldona Lento 11/27/2014, 10:10 AM  [place], [place], [address] Phone: [phone]   Fax:  [phone]

## 2014-12-02 ENCOUNTER — Ambulatory Visit (HOSPITAL_COMMUNITY): Payer: Medicare Other | Admitting: Physical Therapy

## 2014-12-02 DIAGNOSIS — IMO0001 Reserved for inherently not codable concepts without codable children: Secondary | ICD-10-CM

## 2014-12-02 DIAGNOSIS — M5441 Lumbago with sciatica, right side: Secondary | ICD-10-CM

## 2014-12-02 DIAGNOSIS — R29898 Other symptoms and signs involving the musculoskeletal system: Secondary | ICD-10-CM

## 2014-12-02 DIAGNOSIS — M545 Low back pain: Secondary | ICD-10-CM | POA: Diagnosis not present

## 2014-12-02 DIAGNOSIS — S73004A Unspecified dislocation of right hip, initial encounter: Secondary | ICD-10-CM

## 2014-12-02 DIAGNOSIS — R269 Unspecified abnormalities of gait and mobility: Secondary | ICD-10-CM

## 2014-12-02 DIAGNOSIS — M25651 Stiffness of right hip, not elsewhere classified: Secondary | ICD-10-CM

## 2014-12-02 NOTE — Therapy (Signed)
[place], [place], [address] Phone: [phone]   Fax:  [phone]  Physical Therapy Treatment  Patient Details  Name: Nicole Meadows MRN: [id number] Date of Birth: 09/05/1940 Referring Provider:  Lucianne Lei, MD  Encounter Date: 12/02/2014      PT End of Session - 12/02/14 1020    Visit Number 8   Number of Visits 16   Date for PT Re-Evaluation 12/16/14   Authorization Type UHC/Medicare   Authorization Time Period 11/06/14 - 01/06/15   Authorization - Visit Number 8   Authorization - Number of Visits 16   Activity Tolerance Patient tolerated treatment well   Behavior During Therapy [facility] for tasks assessed/performed      Past Medical History  Diagnosis Date  . Tremor   . Vaginal dryness   . Fibroid   . Hypertension   . Coronary artery disease     moderate, by cath  . History of nuclear stress test 05/2010    bruce myoview; small fixed distal anteroapical, apical defect (breast attenuation or apical thinning), no reversible ischemia, post-stress EF 83%, abnormal but low risk study   . Dyslipidemia   . Anemia   . Arthritis   . Glaucoma   . Hyperlipidemia     Past Surgical History  Procedure Laterality Date  . Abdominal hysterectomy  1995  . Tubal ligation  1977  . Transthoracic echocardiogram  2005    EF 60% with borderline conc LVH; mild TR, RVSP 22mmHg   . Cardiac catheterization  05/31/2007    no signficant CAD, low normal EF (Dr. Gerrie Nordmann)   . Breast surgery      Biopsy negative    There were no vitals filed for this visit.  Visit Diagnosis:  Abnormality of gait  Misalignment of right hip, initial encounter  Hip stiffness, right  Weakness of right lower extremity  Midline low back pain with right-sided sciatica      Subjective Assessment - 12/02/14 0939    Subjective Patient states she remains pain free, had a good weekend with mothers day activities    Pertinent History  Patient has had a long history of low back pain particularly with lifting with pain now radiating down Rt thigh and occasionally thoguhout entire leg. H/o of pain >10 years   Currently in Pain? No/denies                         [facility] Adult PT Treatment/Exercise - 12/02/14 0001    Lumbar Exercises: Stretches   Active Hamstring Stretch 3 reps;30 seconds   Active Hamstring Stretch Limitations 14 inch box, 3 way    Hip Flexor Stretch 30 seconds;2 reps   Hip Flexor Stretch Limitations second step    Piriformis Stretch 3 reps;30 seconds  seated   Piriformis Stretch Limitations Gastroc stretch on slantboard 3x30 seconds, 3-way   Lumbar Exercises: Standing   Heel Raises 15 reps   Heel Raises Limitations on floor    Forward Lunge 10 reps   Forward Lunge Limitations 4 inch step    Side Lunge 10 reps   Side Lunge Limitations 4 inch step    Other Standing Lumbar Exercises 3D hip excursions  split stance 1x10   Other Standing Lumbar Exercises Functional squats with cues for form 1x10   Lumbar Exercises: Seated   Other Seated Lumbar Exercises On swiss ball: seated marches; LAQ with contralateral punch; opposite alternating UE/LE flexion  1x10   Lumbar Exercises: Quadruped   Single Arm Raise Right;Left;10 reps   Straight Leg Raise 10 reps;Other (comment)  R and L    Opposite Arm/Leg Raise Right arm/Left leg;Left arm/Right leg;10 reps;Other (comment)  manual cues to facilitate good form                 PT Education - 12/02/14 1020    Education provided No          PT Short Term Goals - 11/27/14 0929    PT SHORT TERM GOAL #1   Title patient will be able to dmeosntrate increased Rt hip extension to 15 degrees with a negative thoams testindicatign improved stride length durign gait   Status On-going   PT SHORT TERM GOAL #2   Title Patient will demosntrate a negatie Rt piriformis test indicating improve hip mobility for improved deceleration mechanicns during gait.     Status On-going   PT SHORT TERM GOAL #3   Title Patiwnt will dmeonstrate improved hip alignemnt with hips maintainign equal alignemnt between >2 sessions.    Status Achieved   PT SHORT TERM GOAL #4   Title Patient will be independent with HEP   Status Achieved           PT Long Term Goals - 11/27/14 0930    PT LONG TERM GOAL #1   Title Patient will be able to demonstrate bilateral hip extension strength of 4+/5 so patient can squat to the ground to work in her garden.    Status On-going   PT LONG TERM GOAL #2   Title Patint will dmoenstrate increased abdominal muscle strength of 5/5 MMT indicating improved trunk stability to allow patient to bend over whle working in garden withtou pain.    Status On-going   PT LONG TERM GOAL #3   Title Patint will demonstrate increased hip abduction strength to decrease trendelenberg gait.    Status On-going   PT LONG TERM GOAL #4   Title patient will be able to work in garden >30 minutes with pain <2/10   Status On-going               Plan - 12/02/14 1020    Clinical Impression Statement Introduced quadruped activities today as well as work on Stage manager in order to increase core activation and improve muscular support to the spine. Patient tolerated new exercises well today but did require cues for proper performance of new exercises. Continued with functional standing exercises today with occasional cues for form. Patient very pleasant today and tolerated session well overall.    Pt will benefit from skilled therapeutic intervention in order to improve on the following deficits Abnormal gait;Decreased endurance;Improper body mechanics;Decreased strength;Impaired flexibility;Postural dysfunction;Decreased activity tolerance;Pain;Decreased range of motion   Rehab Potential Good   PT Frequency 2x / week   PT Duration 8 weeks   PT Treatment/Interventions Gait training;Patient/family education;Functional mobility training;Therapeutic  exercise;Therapeutic activities;Manual techniques;Balance training   PT Next Visit Plan Continue with current POC, manual MET as needed, continue mobility exercises and progress abdominal strengthening with focus on functional standing tolerance.    PT Home Exercise Plan 3D hip excursions   Consulted and Agree with Plan of Care Patient        Problem List Patient Active Problem List   Diagnosis Date Noted  . HTN (hypertension) 07/08/2013  . Dyslipidemia 07/08/2013  . Tremor     Deniece Ree PT, DPT [place]  [place], [place], [address] Phone: [phone]   Fax:  [phone]

## 2014-12-04 ENCOUNTER — Ambulatory Visit (HOSPITAL_COMMUNITY): Payer: Medicare Other

## 2014-12-04 DIAGNOSIS — IMO0001 Reserved for inherently not codable concepts without codable children: Secondary | ICD-10-CM

## 2014-12-04 DIAGNOSIS — M545 Low back pain: Secondary | ICD-10-CM | POA: Diagnosis not present

## 2014-12-04 DIAGNOSIS — S73004A Unspecified dislocation of right hip, initial encounter: Secondary | ICD-10-CM

## 2014-12-04 DIAGNOSIS — R29898 Other symptoms and signs involving the musculoskeletal system: Secondary | ICD-10-CM

## 2014-12-04 DIAGNOSIS — M25651 Stiffness of right hip, not elsewhere classified: Secondary | ICD-10-CM

## 2014-12-04 DIAGNOSIS — M5441 Lumbago with sciatica, right side: Secondary | ICD-10-CM

## 2014-12-04 DIAGNOSIS — R269 Unspecified abnormalities of gait and mobility: Secondary | ICD-10-CM

## 2014-12-04 NOTE — Therapy (Signed)
[place], [place], [address] Phone: [phone]   Fax:  [phone]  Physical Therapy Treatment  Patient Details  Name: Nicole Meadows MRN: [id number] Date of Birth: Feb 24, 1941 Referring Provider:  Lucianne Lei, MD  Encounter Date: 12/04/2014      PT End of Session - 12/04/14 0957    Visit Number 9   Number of Visits 16   Date for PT Re-Evaluation 12/16/14   Authorization Type UHC/Medicare   Authorization Time Period 11/06/14 - 01/06/15   Authorization - Visit Number 9   Authorization - Number of Visits 10   PT Start Time 0932   PT Stop Time 1015   PT Time Calculation (min) 43 min   Activity Tolerance Patient tolerated treatment well   Behavior During Therapy [facility] for tasks assessed/performed      Past Medical History  Diagnosis Date  . Tremor   . Vaginal dryness   . Fibroid   . Hypertension   . Coronary artery disease     moderate, by cath  . History of nuclear stress test 05/2010    bruce myoview; small fixed distal anteroapical, apical defect (breast attenuation or apical thinning), no reversible ischemia, post-stress EF 83%, abnormal but low risk study   . Dyslipidemia   . Anemia   . Arthritis   . Glaucoma   . Hyperlipidemia     Past Surgical History  Procedure Laterality Date  . Abdominal hysterectomy  1995  . Tubal ligation  1977  . Transthoracic echocardiogram  2005    EF 60% with borderline conc LVH; mild TR, RVSP 38mHg   . Cardiac catheterization  05/31/2007    no signficant CAD, low normal EF (Dr. RGerrie Nordmann   . Breast surgery      Biopsy negative    There were no vitals filed for this visit.  Visit Diagnosis:  Abnormality of gait  Misalignment of right hip, initial encounter  Hip stiffness, right  Weakness of right lower extremity  Midline low back pain with right-sided sciatica      Subjective Assessment - 12/04/14 0954    Subjective Pt stated lower back is still  feeling good, stated her Rt shoulder was sore following the quadruped exercise she started last session.   Currently in Pain? No/denies   Pain Location Shoulder   Pain Orientation Right   Pain Descriptors / Indicators Sore            [facility] PT Assessment - 12/04/14 0001    Assessment   Medical Diagnosis Low back pain   Onset Date 09/19/14   Next MD Visit [facility] Pert patient will call.    Prior Therapy yes years ago              [facility] 0001    Exercises   Exercises Lumbar   Lumbar Exercises: Stretches   Active Hamstring Stretch 3 reps;30 seconds   Active Hamstring Stretch Limitations 14 inch box, 3 way    Hip Flexor Stretch 2 reps;20 seconds   Hip Flexor Stretch Limitations second step    Piriformis Stretch 3 reps;30 seconds  seated   Piriformis Stretch Limitations Gastroc stretch on slantboard 3x30 seconds, 3-way   Lumbar Exercises: Standing   Forward Lunge 15 reps   Forward Lunge Limitations 4 inch step    Side Lunge 10 reps   Side Lunge Limitations 4 inch step    Other Standing Lumbar Exercises 3D  hip excursions  split stance 1x10   Lumbar Exercises: Seated   Other Seated Lumbar Exercises On swiss ball: seated marches; LAQ with contralateral punch; opposite alternating UE/LE flexion 1x10   3D thoracic excursion on swiss ball 10x each   Lumbar Exercises: Quadruped   Single Arm Raise Right;Left;10 reps   Straight Leg Raise 10 reps;Other (comment)   Opposite Arm/Leg Raise Right arm/Left leg;Left arm/Right leg;10 reps;Other (comment)               PT Short Term Goals - 12/04/14 1009    PT SHORT TERM GOAL #1   Title patient will be able to dmeosntrate increased Rt hip extension to 15 degrees with a negative thoams testindicatign improved stride length durign gait   Status On-going   PT SHORT TERM GOAL #2   Title Patient will demosntrate a negatie Rt piriformis test indicating improve hip mobility for improved deceleration  mechanicns during gait.    Status On-going   PT SHORT TERM GOAL #3   Title Patiwnt will dmeonstrate improved hip alignemnt with hips maintainign equal alignemnt between >2 sessions.    Status Achieved   PT SHORT TERM GOAL #4   Title Patient will be independent with HEP   Baseline Reports compliance daily   Status Achieved           PT Long Term Goals - 12/04/14 1009    PT LONG TERM GOAL #1   Title Patient will be able to demonstrate bilateral hip extension strength of 4+/5 so patient can squat to the ground to work in her garden.    Status On-going   PT LONG TERM GOAL #2   Title Patint will dmoenstrate increased abdominal muscle strength of 5/5 MMT indicating improved trunk stability to allow patient to bend over whle working in garden withtou pain.    Status On-going   PT LONG TERM GOAL #3   Title Patint will demonstrate increased hip abduction strength to decrease trendelenberg gait.    PT LONG TERM GOAL #4   Title patient will be able to work in garden >30 minutes with pain <2/10   Status On-going               Plan - 12/04/14 0958    Clinical Impression Statement Added 3D thoracic excursion to improve thoracic mobility following reports of soreness following last session, completed on swiss ball to improve core stabilty during new exercise.  Pt continues to require therapist facilitation for appropriate form and proper posture with all therex  especially with squats, lunges and quadruped activities.  No reports of pain through session, pt did state arms are feeling  better   PT Next Visit Plan Continue with current POC, manual MET as needed, continue mobility exercises and progress abdominal strengthening with focus on functional standing tolerance. Begin theraband postural strengthening next session.        Problem List Patient Active Problem List   Diagnosis Date Noted  . HTN (hypertension) 07/08/2013  . Dyslipidemia 07/08/2013  . Tremor    Ihor Austin,  [place]; [place] #[id number] [phone]  Aldona Lento 12/04/2014, 10:14 AM  [place], [place], [address] Phone: [phone]   Fax:  [phone]

## 2014-12-10 ENCOUNTER — Ambulatory Visit (HOSPITAL_COMMUNITY): Payer: Medicare Other | Admitting: Physical Therapy

## 2014-12-10 DIAGNOSIS — R269 Unspecified abnormalities of gait and mobility: Secondary | ICD-10-CM

## 2014-12-10 DIAGNOSIS — R29898 Other symptoms and signs involving the musculoskeletal system: Secondary | ICD-10-CM

## 2014-12-10 DIAGNOSIS — M545 Low back pain: Secondary | ICD-10-CM | POA: Diagnosis not present

## 2014-12-10 DIAGNOSIS — M25651 Stiffness of right hip, not elsewhere classified: Secondary | ICD-10-CM

## 2014-12-10 DIAGNOSIS — M5441 Lumbago with sciatica, right side: Secondary | ICD-10-CM

## 2014-12-10 DIAGNOSIS — S73004A Unspecified dislocation of right hip, initial encounter: Secondary | ICD-10-CM

## 2014-12-10 DIAGNOSIS — IMO0001 Reserved for inherently not codable concepts without codable children: Secondary | ICD-10-CM

## 2014-12-10 NOTE — Therapy (Signed)
[place], [place], [address] Phone: [phone]   Fax:  [phone]  Physical Therapy Treatment/ gcode update  Patient Details  Name: Nicole Meadows MRN: [id number] Date of Birth: 04/13/1941 Referring Provider:  Lucianne Lei, MD  Encounter Date: 12/10/2014      PT End of Session - 12/10/14 1107    Visit Number 10   Number of Visits 16   Date for PT Re-Evaluation 12/16/14   Authorization Type UHC/Medicare   Authorization Time Period 11/06/14 - 01/06/15   Authorization - Visit Number 10   Authorization - Number of Visits 20   PT Start Time 4196   PT Stop Time 1105   PT Time Calculation (min) 47 min   Activity Tolerance Patient tolerated treatment well   Behavior During Therapy [facility] for tasks assessed/performed      Past Medical History  Diagnosis Date  . Tremor   . Vaginal dryness   . Fibroid   . Hypertension   . Coronary artery disease     moderate, by cath  . History of nuclear stress test 05/2010    bruce myoview; small fixed distal anteroapical, apical defect (breast attenuation or apical thinning), no reversible ischemia, post-stress EF 83%, abnormal but low risk study   . Dyslipidemia   . Anemia   . Arthritis   . Glaucoma   . Hyperlipidemia     Past Surgical History  Procedure Laterality Date  . Abdominal hysterectomy  1995  . Tubal ligation  1977  . Transthoracic echocardiogram  2005    EF 60% with borderline conc LVH; mild TR, RVSP 30mHg   . Cardiac catheterization  05/31/2007    no signficant CAD, low normal EF (Dr. RGerrie Nordmann   . Breast surgery      Biopsy negative    There were no vitals filed for this visit.  Visit Diagnosis:  Abnormality of gait  Misalignment of right hip, initial encounter  Hip stiffness, right  Weakness of right lower extremity  Midline low back pain with right-sided sciatica      Subjective Assessment - 12/10/14 1025    Subjective Pt states her  Rt shoulder is still hurting badly, 8/10 since beginning quadruped exercises.  PT states her back is painfree   Currently in Pain? No/denies   Multiple Pain Sites Yes   Pain Score 8   Pain Location Shoulder   Pain Orientation Right            [facility] PT Assessment - 12/10/14 1146    Assessment   Medical Diagnosis Low back pain   Onset Date 09/19/14   Next MD Visit [facility] Pert patient will call.    Observation/Other Assessments   Focus on Therapeutic Outcomes (FOTO)  30% limited (was 29%)                     [facility] Adult PT Treatment/Exercise - 12/10/14 1027    Lumbar Exercises: Stretches   Active Hamstring Stretch 3 reps;30 seconds   Active Hamstring Stretch Limitations 14 inch box, 3 way    Piriformis Stretch 3 reps;30 seconds  seated   Piriformis Stretch Limitations Gastroc stretch on slantboard 3x30 seconds, 3-way   Lumbar Exercises: Standing   Forward Lunge 10 reps   Forward Lunge Limitations 2 inch step    Side Lunge 10 reps   Side Lunge Limitations 2 inch step    Manual Therapy   Manual Therapy Soft tissue  mobilization   Soft tissue mobilization Rt scapular and thoracic musculature                  PT Short Term Goals - January 09, 2015 1102    PT SHORT TERM GOAL #1   Title patient will be able to dmeosntrate increased Rt hip extension to 15 degrees with a negative thomas test indicating improved stride length during gait   Time 4   Period Weeks   Status On-going   PT SHORT TERM GOAL #2   Title Patient will demosntrate a negative Rt piriformis test indicating improve hip mobility for improved deceleration mechanics during gait.    Time 4   Period Weeks   Status On-going   PT SHORT TERM GOAL #3   Title Patient will demonstrate improved hip alignment with hips maintaining equal alignment between >2 sessions.    Time 4   Period Weeks   Status Achieved   PT SHORT TERM GOAL #4   Title Patient will be independent with HEP   Baseline Reports  compliance daily   Time 4   Period Weeks   Status Achieved           PT Long Term Goals - January 09, 2015 1104    PT LONG TERM GOAL #1   Title Patient will be able to demonstrate bilateral hip extension strength of 4+/5 so patient can squat to the ground to work in her garden.    Time 8   Period Weeks   Status On-going   PT LONG TERM GOAL #2   Title Patint will dmoenstrate increased abdominal muscle strength of 5/5 MMT indicating improved trunk stability to allow patient to bend over whle working in garden withtou pain.    Time 8   Period Weeks   Status On-going   PT LONG TERM GOAL #3   Title Patint will demonstrate increased hip abduction strength to decrease trendelenberg gait.    Time 8   Period Weeks   Status On-going   PT LONG TERM GOAL #4   Title patient will be able to work in garden >30 minutes with pain <2/10   Time 8   Period Weeks   Status Achieved               Plan - 09-Jan-2015 1126    Clinical Impression Statement FOTO completed and Gcodes updated.  Pt is overall progressing well and has met 2/4 STG's and 1/4 LTG's.  Pt is curently no longer with back pain and is completing all ADL's at home without pain.  PT reports her Rt shoulder and thoracic region is flared up due to addition of quadruped exericises.  Completed soft tissue masage to this area today to decrease pain.  Pain went from 8 to 4/10 by end of session.  Pt with 6 visits remaining.   PT Next Visit Plan Continue with current POC, manual MET as needed, continue mobility exercises and progress abdominal strengthening with focus on core and functional standing tolerance. Begin theraband postural strengthening next session.        Problem List Patient Active Problem List   Diagnosis Date Noted  . HTN (hypertension) 07/08/2013  . Dyslipidemia 07/08/2013  . Tremor     Teena Irani, PTA/CLT [phone]       G-Codes - 01-09-2015 1645    Functional Assessment Tool Used FOTO    Functional  Limitation Changing and maintaining body position   Changing and Maintaining Body Position Current Status [phone]) At  least 20 percent but less than 40 percent impaired, limited or restricted   Changing and Maintaining Body Position Goal Status ([id number]) At least 1 percent but less than 20 percent impaired, limited or restricted      Deniece Ree PT, DPT [phone]   12/10/2014, 4:46 PM  [place], [place], [address] Phone: [phone]   Fax:  [phone]

## 2014-12-12 ENCOUNTER — Ambulatory Visit (HOSPITAL_COMMUNITY): Payer: Medicare Other | Admitting: Physical Therapy

## 2014-12-12 DIAGNOSIS — M25651 Stiffness of right hip, not elsewhere classified: Secondary | ICD-10-CM

## 2014-12-12 DIAGNOSIS — IMO0001 Reserved for inherently not codable concepts without codable children: Secondary | ICD-10-CM

## 2014-12-12 DIAGNOSIS — M5441 Lumbago with sciatica, right side: Secondary | ICD-10-CM

## 2014-12-12 DIAGNOSIS — R29898 Other symptoms and signs involving the musculoskeletal system: Secondary | ICD-10-CM

## 2014-12-12 DIAGNOSIS — R269 Unspecified abnormalities of gait and mobility: Secondary | ICD-10-CM

## 2014-12-12 DIAGNOSIS — M545 Low back pain: Secondary | ICD-10-CM | POA: Diagnosis not present

## 2014-12-12 DIAGNOSIS — S73004A Unspecified dislocation of right hip, initial encounter: Secondary | ICD-10-CM

## 2014-12-12 NOTE — Therapy (Signed)
[place], [place], [address] Phone: [phone]   Fax:  [phone]  Physical Therapy Treatment  Patient Details  Name: Nicole Meadows MRN: [id number] Date of Birth: 07/31/1940 Referring Provider:  Lucianne Lei, MD  Encounter Date: 12/12/2014      PT End of Session - 12/12/14 1602    Visit Number 11   Number of Visits 16   Date for PT Re-Evaluation 12/16/14   Authorization Type UHC/Medicare   Authorization Time Period 11/06/14 - 01/06/15; G-code done 10th session   Authorization - Visit Number 11   Authorization - Number of Visits 20   PT Start Time 2060   PT Stop Time 1559   PT Time Calculation (min) 43 min   Activity Tolerance Patient tolerated treatment well   Behavior During Therapy [facility] for tasks assessed/performed      Past Medical History  Diagnosis Date  . Tremor   . Vaginal dryness   . Fibroid   . Hypertension   . Coronary artery disease     moderate, by cath  . History of nuclear stress test 05/2010    bruce myoview; small fixed distal anteroapical, apical defect (breast attenuation or apical thinning), no reversible ischemia, post-stress EF 83%, abnormal but low risk study   . Dyslipidemia   . Anemia   . Arthritis   . Glaucoma   . Hyperlipidemia     Past Surgical History  Procedure Laterality Date  . Abdominal hysterectomy  1995  . Tubal ligation  1977  . Transthoracic echocardiogram  2005    EF 60% with borderline conc LVH; mild TR, RVSP 51mHg   . Cardiac catheterization  05/31/2007    no signficant CAD, low normal EF (Dr. RGerrie Nordmann   . Breast surgery      Biopsy negative    There were no vitals filed for this visit.  Visit Diagnosis:  Abnormality of gait  Misalignment of right hip, initial encounter  Hip stiffness, right  Weakness of right lower extremity  Midline low back pain with right-sided sciatica      Subjective Assessment - 12/12/14 1517    Subjective  Patient states that her Rt shoulder is still bothering her, 5-6/10, but feeling better since last session. Back continues to be painfree at this time.    Pertinent History Patient has had a long history of low back pain particularly with lifting with pain now radiating down Rt thigh and occasionally thoguhout entire leg. H/o of pain >10 years   Currently in Pain? Yes   Pain Score 6    Pain Location Shoulder   Pain Orientation Right   Pain Descriptors / Indicators Sore                         [facility] Adult PT Treatment/Exercise - 12/12/14 0001    Lumbar Exercises: Stretches   Active Hamstring Stretch 3 reps;30 seconds   Active Hamstring Stretch Limitations 14 inch box, 3 way    Passive Hamstring Stretch 2 reps;30 seconds   Passive Hamstring Stretch Limitations seated piriformis stretch    Hip Flexor Stretch 2 reps;30 seconds   Hip Flexor Stretch Limitations stairs    Piriformis Stretch 3 reps;30 seconds   Piriformis Stretch Limitations Gastroc stretch on slantboard 3x30 seconds   Lumbar Exercises: Standing   Forward Lunge 15 reps   Forward Lunge Limitations 2 inch step    Side Lunge 10 reps  Side Lunge Limitations 2 inch box    Other Standing Lumbar Exercises 3D hip excursions  split stance 1x10; sidestepping 2x approx 73f   Other Standing Lumbar Exercises Functional squats toes neutral and toes out    Lumbar Exercises: Seated   Other Seated Lumbar Exercises On swiss ball: LAQs; LAQs with cross-midline reach; alternating opposite UEs/LEs; seated marches with ipsilateral trunk rotation    Manual Therapy   Manual Therapy Soft tissue mobilization   Soft tissue mobilization R cervical extensors and R upper trap                 PT Education - 12/12/14 1602    Education provided No          PT Short Term Goals - 12/10/14 1102    PT SHORT TERM GOAL #1   Title patient will be able to dmeosntrate increased Rt hip extension to 15 degrees with a negative thomas  test indicating improved stride length during gait   Time 4   Period Weeks   Status On-going   PT SHORT TERM GOAL #2   Title Patient will demosntrate a negative Rt piriformis test indicating improve hip mobility for improved deceleration mechanics during gait.    Time 4   Period Weeks   Status On-going   PT SHORT TERM GOAL #3   Title Patient will demonstrate improved hip alignment with hips maintaining equal alignment between >2 sessions.    Time 4   Period Weeks   Status Achieved   PT SHORT TERM GOAL #4   Title Patient will be independent with HEP   Baseline Reports compliance daily   Time 4   Period Weeks   Status Achieved           PT Long Term Goals - 12/10/14 1104    PT LONG TERM GOAL #1   Title Patient will be able to demonstrate bilateral hip extension strength of 4+/5 so patient can squat to the ground to work in her garden.    Time 8   Period Weeks   Status On-going   PT LONG TERM GOAL #2   Title Patint will dmoenstrate increased abdominal muscle strength of 5/5 MMT indicating improved trunk stability to allow patient to bend over whle working in garden withtou pain.    Time 8   Period Weeks   Status On-going   PT LONG TERM GOAL #3   Title Patint will demonstrate increased hip abduction strength to decrease trendelenberg gait.    Time 8   Period Weeks   Status On-going   PT LONG TERM GOAL #4   Title patient will be able to work in garden >30 minutes with pain <2/10   Time 8   Period Weeks   Status Achieved               Plan - 12/12/14 1603    Clinical Impression Statement Continued functional exercises and stretches today. Patient continues to tolerate treatments well and continues to no longer have pain in her back; however she continues to report that her R shoulder is painful after addition of quadruped exercises. Continued soft tissue mobilization to R cervical extensors and R upper trap today with decrease in pain down to 2/10 today after soft  tissue work. Pt with 5 visits remaining.     Pt will benefit from skilled therapeutic intervention in order to improve on the following deficits Abnormal gait;Decreased endurance;Improper body mechanics;Decreased strength;Impaired flexibility;Postural dysfunction;Decreased activity tolerance;Pain;Decreased range of  motion   Rehab Potential Good   PT Frequency 2x / week   PT Duration 8 weeks   PT Treatment/Interventions Gait training;Patient/family education;Functional mobility training;Therapeutic exercise;Therapeutic activities;Manual techniques;Balance training   PT Next Visit Plan Continue with current POC, manual MET as needed, continue mobility exercises and progress abdominal strengthening with focus on core and functional standing tolerance. Begin theraband postural strengthening next session.    PT Home Exercise Plan 3D hip excursions   Consulted and Agree with Plan of Care Patient        Problem List Patient Active Problem List   Diagnosis Date Noted  . HTN (hypertension) 07/08/2013  . Dyslipidemia 07/08/2013  . Tremor     Deniece Ree PT, DPT [place] [address] [place], [place], [address] Phone: [phone]   Fax:  [phone]

## 2014-12-19 ENCOUNTER — Ambulatory Visit (HOSPITAL_COMMUNITY): Payer: Medicare Other

## 2014-12-19 DIAGNOSIS — S73004A Unspecified dislocation of right hip, initial encounter: Secondary | ICD-10-CM

## 2014-12-19 DIAGNOSIS — R29898 Other symptoms and signs involving the musculoskeletal system: Secondary | ICD-10-CM

## 2014-12-19 DIAGNOSIS — R269 Unspecified abnormalities of gait and mobility: Secondary | ICD-10-CM

## 2014-12-19 DIAGNOSIS — IMO0001 Reserved for inherently not codable concepts without codable children: Secondary | ICD-10-CM

## 2014-12-19 DIAGNOSIS — M5441 Lumbago with sciatica, right side: Secondary | ICD-10-CM

## 2014-12-19 DIAGNOSIS — M545 Low back pain: Secondary | ICD-10-CM | POA: Diagnosis not present

## 2014-12-19 DIAGNOSIS — M25651 Stiffness of right hip, not elsewhere classified: Secondary | ICD-10-CM

## 2014-12-19 NOTE — Therapy (Signed)
[place], [place], [address] Phone: [phone]   Fax:  [phone]  Physical Therapy Treatment  Patient Details  Name: Nicole Meadows MRN: [id number] Date of Birth: 1941-03-02 Referring Provider:  Lucianne Lei, MD  Encounter Date: 12/19/2014      PT End of Session - 12/19/14 1359    Visit Number 12   Number of Visits 16   Date for PT Re-Evaluation 01/06/15   Authorization Type UHC/Medicare   Authorization Time Period 11/06/14 - 01/06/15; G-code done 10th session   Authorization - Visit Number 12   Authorization - Number of Visits 20   PT Start Time 1350   PT Stop Time 1452   PT Time Calculation (min) 62 min   Activity Tolerance Patient tolerated treatment well   Behavior During Therapy [facility] for tasks assessed/performed      Past Medical History  Diagnosis Date  . Tremor   . Vaginal dryness   . Fibroid   . Hypertension   . Coronary artery disease     moderate, by cath  . History of nuclear stress test 05/2010    bruce myoview; small fixed distal anteroapical, apical defect (breast attenuation or apical thinning), no reversible ischemia, post-stress EF 83%, abnormal but low risk study   . Dyslipidemia   . Anemia   . Arthritis   . Glaucoma   . Hyperlipidemia     Past Surgical History  Procedure Laterality Date  . Abdominal hysterectomy  1995  . Tubal ligation  1977  . Transthoracic echocardiogram  2005    EF 60% with borderline conc LVH; mild TR, RVSP 80mHg   . Cardiac catheterization  05/31/2007    no signficant CAD, low normal EF (Dr. RGerrie Nordmann   . Breast surgery      Biopsy negative    There were no vitals filed for this visit.  Visit Diagnosis:  Abnormality of gait  Misalignment of right hip, initial encounter  Hip stiffness, right  Weakness of right lower extremity  Midline low back pain with right-sided sciatica      Subjective Assessment - 12/19/14 1354    Subjective Pt  stated her Rt shoulder is feeling better; reports increased pain in back from [place] 3[place]10 in center part of back   Currently in Pain? Yes   Pain Score 3    Pain Location Back   Pain Orientation Lower;Mid   Pain Descriptors / Indicators Sore;Aching            [facility]    Assessment   Medical Diagnosis Low back pain   Onset Date/Surgical Date 09/19/14   Next MD Visit [facility] patient will call.    Prior Therapy yes years ago   Functional Tests   Functional tests Other;Other2;Sit to Stand   Sit to Stand   Comments 5x sit to stand 10.39 seconds  5x sit to stand was 13.2seconds   Other:   Other/ Comments Gait: limited ability to toe in and out, limited ankle eversion,early heel rise.    AROM   AROM Assessment Site Hip;Knee   Right/Left Hip Right;Left   Right Hip External Rotation  50  was 30   Right Hip Internal Rotation  45  was 30   Left Hip External Rotation  50  was 45   Left Hip Internal Rotation  30  was 20   Right/Left Knee  Right;Left   Right Ankle Dorsiflexion --  was 6   Left Ankle Dorsiflexion 18  was 11   Lumbar Flexion 18  was 88   Lumbar Extension --  was 44 pain at end range   Strength   Strength Assessment Site Lumbar;Knee;Hip;Ankle   Right/Left Hip Right;Left   Right Hip Flexion 4/5  was 4-/5   Right Hip Extension 4/5   Right Hip ABduction 4+/5  was 3-/5   Left Hip Flexion 4+/5  was 4/5   Left Hip Extension 4+/5   Left Hip ABduction 4+/5  was 3+/5   Right/Left Knee Right;Left   Right Knee Flexion 4+/5  was 4-/5   Right Knee Extension --  was 4+/5   Left Knee Flexion 4+/5  was 4-/5   Left Knee Extension --  was 4+/5   Right Ankle Dorsiflexion 5/5   Left Ankle Dorsiflexion 5/5   Lumbar Flexion 3-/5   Lumbar Extension 3-/5   Flexibility   Hamstrings Rt 90 degrees; Lt 100 degrees  was 70 degrees Bil SLR   Piriformis Positive Lt  was positive bilaterally   Special  Tests    Special Tests Hip Special Tests   Hip Special Tests  Marcello Moores Test;Ely's Test   Straight Leg Raise   Findings Negative  was positive   Comment Rt 98 degrees; Lt 100 degrees   Apparent   Comments SI within normal alignment  Rt leg appears longer secodnary to a right Rt hip anterior t   Thomas Test    Findings Negative  was positive   Ely's Test   Findings --  was positive Rt and LT    Comments Rt 116 degress Lt 118 degrees  was 110 bilaterally               [facility] Adult PT Treatment/Exercise - 12/19/14 0001    Exercises   Exercises Lumbar   Lumbar Exercises: Stretches   Active Hamstring Stretch 3 reps;30 seconds   Active Hamstring Stretch Limitations 14 inch box, 3 way    Hip Flexor Stretch 3 reps;30 seconds   Hip Flexor Stretch Limitations 14in step   Standing Side Bend Limitations 10x each   Quadruped Mid Back Stretch 3 reps;30 seconds   Quadruped Mid Back Stretch Limitations Upper trap strech   Piriformis Stretch 3 reps;30 seconds   Piriformis Stretch Limitations seated piriformis stretch   Lumbar Exercises: Standing   Functional Squats Limitations 3 sets 5x sit to stand 10lb dumbell.    Scapular Retraction Both;10 reps;Theraband   Theraband Level (Scapular Retraction) Level 3 (Green)   Row Both;10 reps   Theraband Level (Row) Level 3 (Green)   Shoulder Extension Both;10 reps;Theraband   Theraband Level (Shoulder Extension) Level 3 (Green)   Other Standing Lumbar Exercises 3D hip excursions  split stance 1x10;  UE overhead matrix with 3# 3x                  PT Short Term Goals - 12/19/14 1359    PT SHORT TERM GOAL #1   Title patient will be able to dmeosntrate increased Rt hip extension to 15 degrees with a negative thomas test indicating improved stride length during gait   Status Achieved   PT SHORT TERM GOAL #2   Title Patient will demosntrate a negative Rt piriformis test indicating improve hip mobility for improved deceleration mechanics  during gait.    Status Achieved   PT SHORT TERM GOAL #3   Title Patient will  demonstrate improved hip alignment with hips maintaining equal alignment between >2 sessions.    Status Achieved   PT SHORT TERM GOAL #4   Title Patient will be independent with HEP   Baseline Reports compliance daily   Status Achieved           PT Long Term Goals - 12/19/14 1400    PT LONG TERM GOAL #1   Title Patient will be able to demonstrate bilateral hip extension strength of 4+/5 so patient can squat to the ground to work in her garden.    Status Partially Met   PT LONG TERM GOAL #2   Title Patint will dmoenstrate increased abdominal muscle strength of 5/5 MMT indicating improved trunk stability to allow patient to bend over whle working in garden withtou pain.    Status On-going   PT LONG TERM GOAL #3   Title Patint will demonstrate increased hip abduction strength to decrease trendelenberg gait.    Status On-going   PT LONG TERM GOAL #4   Title patient will be able to work in garden >30 minutes with pain <2/10   Status Achieved               Plan - 12/19/14 1457    Clinical Impression Statement Reassessment complete with the following findings:  Pt independent with HEP as well as a member of the [employer], able to verbalize and/or demonstrate appropraite technqiues with all exercises.  Bil Hip AROM improving with Lt IR only limited ROM.  SI within normal alignment with no reports of pain for weeks until  today, pt stated increased pain following ride for long periods of time.  Pt continues to c/o Rt shoulder pain following quadruped exercises, Upper traps stretches educated to pt to complete at home with min cueing for proper technique.  Overall LE strength has improved Bil LE, pt continues to exhibit weak core musculature.  Pt will continue to benefit from skilled intervention to improve Lt hip IR, core strengthening and functional strengthening.   PT Next Visit Plan Recommmend continuing OPPT  for 2x 2 more weeks to improve hip mobilty, functional strenghtening and core/posture strengthening activities.  Continue theraband postural strengthening next session, UE overhead matrix, review upper trap stretch.        Problem List Patient Active Problem List   Diagnosis Date Noted  . HTN (hypertension) 07/08/2013  . Dyslipidemia 07/08/2013  . Tremor    Aldona Lento, PTA   Aldona Lento 12/19/2014, 3:20 PM  [place] [address] [place], [place], [address] Phone: [phone]   Fax:  [phone]

## 2014-12-23 ENCOUNTER — Ambulatory Visit (HOSPITAL_COMMUNITY): Payer: Medicare Other

## 2014-12-23 DIAGNOSIS — M25651 Stiffness of right hip, not elsewhere classified: Secondary | ICD-10-CM

## 2014-12-23 DIAGNOSIS — R269 Unspecified abnormalities of gait and mobility: Secondary | ICD-10-CM

## 2014-12-23 DIAGNOSIS — R29898 Other symptoms and signs involving the musculoskeletal system: Secondary | ICD-10-CM

## 2014-12-23 DIAGNOSIS — M545 Low back pain: Secondary | ICD-10-CM | POA: Diagnosis not present

## 2014-12-23 DIAGNOSIS — IMO0001 Reserved for inherently not codable concepts without codable children: Secondary | ICD-10-CM

## 2014-12-23 DIAGNOSIS — M5441 Lumbago with sciatica, right side: Secondary | ICD-10-CM

## 2014-12-23 DIAGNOSIS — S73004A Unspecified dislocation of right hip, initial encounter: Secondary | ICD-10-CM

## 2014-12-23 NOTE — Patient Instructions (Signed)
Flexibility: Upper Trapezius Stretch   Gently grasp right side of head while reaching behind back with other hand. Tilt head away until a gentle stretch is felt. Hold 30 seconds. Repeat 3 times per set. Do 3 sets per session. Do 1-2 sessions per day.  http://orth.exer.us/341   Copyright  VHI. All rights reserved.

## 2014-12-23 NOTE — Therapy (Signed)
[place], [place], [address] Phone: [phone]   Fax:  [phone]  Physical Therapy Treatment  Patient Details  Name: Nicole Meadows MRN: [id number] Date of Birth: 01-31-1941 Referring Provider:  Lucianne Lei, MD  Encounter Date: 12/23/2014      PT End of Session - 12/23/14 1530    Visit Number 13   Number of Visits 16   Date for PT Re-Evaluation 01/06/15   Authorization Type UHC/Medicare   Authorization Time Period 11/06/14 - 01/06/15; G-code done 10th session   Authorization - Visit Number 13   Authorization - Number of Visits 20   PT Start Time 1520   PT Stop Time 1600   PT Time Calculation (min) 40 min   Activity Tolerance Patient tolerated treatment well   Behavior During Therapy [facility] for tasks assessed/performed      Past Medical History  Diagnosis Date  . Tremor   . Vaginal dryness   . Fibroid   . Hypertension   . Coronary artery disease     moderate, by cath  . History of nuclear stress test 05/2010    bruce myoview; small fixed distal anteroapical, apical defect (breast attenuation or apical thinning), no reversible ischemia, post-stress EF 83%, abnormal but low risk study   . Dyslipidemia   . Anemia   . Arthritis   . Glaucoma   . Hyperlipidemia     Past Surgical History  Procedure Laterality Date  . Abdominal hysterectomy  1995  . Tubal ligation  1977  . Transthoracic echocardiogram  2005    EF 60% with borderline conc LVH; mild TR, RVSP 83mmHg   . Cardiac catheterization  05/31/2007    no signficant CAD, low normal EF (Dr. Gerrie Nordmann)   . Breast surgery      Biopsy negative    There were no vitals filed for this visit.  Visit Diagnosis:  Abnormality of gait  Misalignment of right hip, initial encounter  Hip stiffness, right  Weakness of right lower extremity  Midline low back pain with right-sided sciatica      Subjective Assessment - 12/23/14 1522    Subjective Pt  stated she has slept good with no medicationthe last couple nights with no reports of back or shoulder pain.     Currently in Pain? No/denies            [facility] PT Assessment - 12/23/14 0001    Assessment   Medical Diagnosis Low back pain   Next MD Visit [facility], patient will call.    Prior Therapy yes years ago              [facility] Adult PT Treatment/Exercise - 12/23/14 0001    Exercises   Exercises Lumbar   Lumbar Exercises: Stretches   Piriformis Stretch 3 reps;30 seconds   Piriformis Stretch Limitations seated piriformis stretch   Lumbar Exercises: Standing   Scapular Retraction Both;10 reps;Theraband   Theraband Level (Scapular Retraction) Level 3 (Green)   Row Both;10 reps   Theraband Level (Row) Level 3 (Green)   Shoulder Extension Both;10 reps;Theraband   Theraband Level (Shoulder Extension) Level 3 (Green)   Other Standing Lumbar Exercises 3D hip excursions  split stance 1x10;  UE overhead matrix with 3# 5x   Lumbar Exercises: Seated   Other Seated Lumbar Exercises 3D thoracic excursion   Lumbar Exercises: Supine   Bent Knee Raise 10 reps   Bent Knee Raise Limitations floating  Bridge 20 reps   Straight Leg Raise 10 reps   Straight Leg Raises Limitations floating            PT Short Term Goals - 12/23/14 1555    PT SHORT TERM GOAL #1   Title patient will be able to dmeosntrate increased Rt hip extension to 15 degrees with a negative thomas test indicating improved stride length during gait   Status Achieved   PT SHORT TERM GOAL #2   Title Patient will demosntrate a negative Rt piriformis test indicating improve hip mobility for improved deceleration mechanics during gait.    Status Achieved   PT SHORT TERM GOAL #3   Title Patient will demonstrate improved hip alignment with hips maintaining equal alignment between >2 sessions.    Status Achieved   PT SHORT TERM GOAL #4   Title Patient will be independent with HEP   Status Achieved            PT Long Term Goals - 12/23/14 1556    PT LONG TERM GOAL #1   Title Patient will be able to demonstrate bilateral hip extension strength of 4+/5 so patient can squat to the ground to work in her garden.    Status On-going   PT LONG TERM GOAL #2   Title Patint will dmoenstrate increased abdominal muscle strength of 5/5 MMT indicating improved trunk stability to allow patient to bend over whle working in garden withtou pain.    Status On-going   PT LONG TERM GOAL #3   Title Patint will demonstrate increased hip abduction strength to decrease trendelenberg gait.    Status On-going   PT LONG TERM GOAL #4   Title patient will be able to work in garden >30 minutes with pain <2/10   Status Achieved               Plan - 12/23/14 1531    Clinical Impression Statement Session focus on improving core, posture and gluteal strengthening.  Pt able to demonstrate approriate form with postural strengthening theraband exercise, pt given prinout and theraband to add to HEP.  Reviewed form and technique with upper trap stretches to assure correct form, printout given.  Progressed core strengtehning with mat activities, noted core instability with increased demand.  No reports of pain through session.     PT Next Visit Plan Continue with current PT POC to imprve hip mobility with Lt hip IR, functional strengthening and core/posture strengthening activities.  Assess correct form and technique with theraband, continue UE overhead matrix, 4 way pick up.   PT Home Exercise Plan Posture strengthening printout and theraband; upper trap stretch        Problem List Patient Active Problem List   Diagnosis Date Noted  . HTN (hypertension) 07/08/2013  . Dyslipidemia 07/08/2013  . Tremor    Aldona Lento, PTA  Aldona Lento 12/23/2014, 4:00 PM  [place] [address] [place], [place], [address] Phone: [phone]   Fax:  [phone]

## 2014-12-25 ENCOUNTER — Ambulatory Visit (HOSPITAL_COMMUNITY): Payer: Medicare Other | Attending: Family Medicine

## 2014-12-25 DIAGNOSIS — IMO0001 Reserved for inherently not codable concepts without codable children: Secondary | ICD-10-CM

## 2014-12-25 DIAGNOSIS — M6281 Muscle weakness (generalized): Secondary | ICD-10-CM | POA: Insufficient documentation

## 2014-12-25 DIAGNOSIS — I10 Essential (primary) hypertension: Secondary | ICD-10-CM | POA: Diagnosis not present

## 2014-12-25 DIAGNOSIS — M5441 Lumbago with sciatica, right side: Secondary | ICD-10-CM

## 2014-12-25 DIAGNOSIS — M25651 Stiffness of right hip, not elsewhere classified: Secondary | ICD-10-CM | POA: Diagnosis not present

## 2014-12-25 DIAGNOSIS — R29898 Other symptoms and signs involving the musculoskeletal system: Secondary | ICD-10-CM

## 2014-12-25 DIAGNOSIS — M545 Low back pain: Secondary | ICD-10-CM | POA: Insufficient documentation

## 2014-12-25 DIAGNOSIS — R269 Unspecified abnormalities of gait and mobility: Secondary | ICD-10-CM

## 2014-12-25 DIAGNOSIS — S73004A Unspecified dislocation of right hip, initial encounter: Secondary | ICD-10-CM

## 2014-12-25 NOTE — Therapy (Signed)
[place], [place], [address] Phone: [phone]   Fax:  [phone]  Physical Therapy Treatment  Patient Details  Name: Nicole Meadows MRN: [id number] Date of Birth: 02-Nov-1940 Referring Provider:  Lucianne Lei, MD  Encounter Date: 12/25/2014      PT End of Session - 12/25/14 1644    Visit Number 14   Number of Visits 116   Date for PT Re-Evaluation 01/06/15   Authorization Type UHC/Medicare   Authorization Time Period 11/06/14 - 01/06/15; G-code done 10th session   Authorization - Visit Number 13   Authorization - Number of Visits 20   PT Start Time 1100   PT Stop Time 1144   PT Time Calculation (min) 44 min   Activity Tolerance Patient tolerated treatment well   Behavior During Therapy [facility] for tasks assessed/performed      Past Medical History  Diagnosis Date  . Tremor   . Vaginal dryness   . Fibroid   . Hypertension   . Coronary artery disease     moderate, by cath  . History of nuclear stress test 05/2010    bruce myoview; small fixed distal anteroapical, apical defect (breast attenuation or apical thinning), no reversible ischemia, post-stress EF 83%, abnormal but low risk study   . Dyslipidemia   . Anemia   . Arthritis   . Glaucoma   . Hyperlipidemia     Past Surgical History  Procedure Laterality Date  . Abdominal hysterectomy  1995  . Tubal ligation  1977  . Transthoracic echocardiogram  2005    EF 60% with borderline conc LVH; mild TR, RVSP 5mmHg   . Cardiac catheterization  05/31/2007    no signficant CAD, low normal EF (Dr. Gerrie Nordmann)   . Breast surgery      Biopsy negative    There were no vitals filed for this visit.  Visit Diagnosis:  Misalignment of right hip, initial encounter  Abnormality of gait  Hip stiffness, right  Weakness of right lower extremity  Midline low back pain with right-sided sciatica      Subjective Assessment - 12/25/14 1304    Subjective Pt  reports having a good weekend. No immediate questions or complaints with HEP. Reports continues 2/10 R shoulder pain from previous sessions.    Pertinent History Patient has had a long history of low back pain particularly with lifting with pain now radiating down Rt thigh and occasionally thoguhout entire leg. H/o of pain >10 years   Currently in Pain? Yes   Pain Score 2    Pain Location Shoulder   Pain Orientation Right                         [facility] Adult PT Treatment/Exercise - 12/25/14 0001    Lumbar Exercises: Stretches   Piriformis Stretch 3 reps;30 seconds   Piriformis Stretch Limitations seated piriformis stretch   Lumbar Exercises: Standing   Scapular Retraction Both;Theraband  2x10    Theraband Level (Scapular Retraction) Level 3 (Green)  heavy tactile cues for form correction.    Row Both   Theraband Level (Row) Level 3 (Green)  2x1   Row Limitations 2x12 tactile cues for form.    Shoulder Extension Both;10 reps;Theraband  2 sets   Theraband Level (Shoulder Extension) Level 2 (Red)   Other Standing Lumbar Exercises 3D hip excursions  split stance 1x10;  UE overhead matrix with 3# 5x  Other Standing Lumbar Exercises Shoulder ER red TB, 2x8 BLE   Lumbar Exercises: Seated   Other Seated Lumbar Exercises 3D thoracic excursion   Lumbar Exercises: Supine   Bridge --  2x10 with TrAbd contraction   Straight Leg Raise --  2x10 bilat   Straight Leg Raises Limitations floating   Lumbar Exercises: Quadruped   Straight Leg Raise Other (comment)  2x10 bilat with knee straight                  PT Short Term Goals - 12/23/14 1555    PT SHORT TERM GOAL #1   Title patient will be able to dmeosntrate increased Rt hip extension to 15 degrees with a negative thomas test indicating improved stride length during gait   Status Achieved   PT SHORT TERM GOAL #2   Title Patient will demosntrate a negative Rt piriformis test indicating improve hip mobility for  improved deceleration mechanics during gait.    Status Achieved   PT SHORT TERM GOAL #3   Title Patient will demonstrate improved hip alignment with hips maintaining equal alignment between >2 sessions.    Status Achieved   PT SHORT TERM GOAL #4   Title Patient will be independent with HEP   Status Achieved           PT Long Term Goals - 12/23/14 1556    PT LONG TERM GOAL #1   Title Patient will be able to demonstrate bilateral hip extension strength of 4+/5 so patient can squat to the ground to work in her garden.    Status On-going   PT LONG TERM GOAL #2   Title Patint will dmoenstrate increased abdominal muscle strength of 5/5 MMT indicating improved trunk stability to allow patient to bend over whle working in garden withtou pain.    Status On-going   PT LONG TERM GOAL #3   Title Patint will demonstrate increased hip abduction strength to decrease trendelenberg gait.    Status On-going   PT LONG TERM GOAL #4   Title patient will be able to work in garden >30 minutes with pain <2/10   Status Achieved               Plan - 12/25/14 1644    Clinical Impression Statement Pt progressing well toward goals. Pt demonstrating improved tolerance to thoracic extension activities, but continues to require verbal and tactile cues to correct form during other shoulder therex.    Pt will benefit from skilled therapeutic intervention in order to improve on the following deficits Abnormal gait;Decreased endurance;Improper body mechanics;Decreased strength;Impaired flexibility;Postural dysfunction;Decreased activity tolerance;Pain;Decreased range of motion   Rehab Potential Good   PT Frequency 2x / week   PT Duration 8 weeks   PT Treatment/Interventions Gait training;Patient/family education;Functional mobility training;Therapeutic exercise;Therapeutic activities;Manual techniques;Balance training   PT Next Visit Plan Continue with current PT POC to imprve hip mobility with Lt hip IR,  functional strengthening and core/posture strengthening activities.  Assess correct form and technique with theraband, continue UE overhead matrix, 4 way pick up.   PT Home Exercise Plan Same:    Consulted and Agree with Plan of Care Patient        Problem List Patient Active Problem List   Diagnosis Date Noted  . HTN (hypertension) 07/08/2013  . Dyslipidemia 07/08/2013  . Tremor     Buccola,Allan C 12/25/2014, 4:47 PM  4:47 PM  Etta Grandchild, PT, DPT [place] License # [id number]  [place], [place], [address] Phone: [phone]   Fax:  [phone]

## 2014-12-30 ENCOUNTER — Encounter (HOSPITAL_COMMUNITY): Payer: Self-pay

## 2014-12-30 ENCOUNTER — Ambulatory Visit (HOSPITAL_COMMUNITY): Payer: Medicare Other

## 2014-12-30 DIAGNOSIS — S73004A Unspecified dislocation of right hip, initial encounter: Principal | ICD-10-CM

## 2014-12-30 DIAGNOSIS — M5441 Lumbago with sciatica, right side: Secondary | ICD-10-CM

## 2014-12-30 DIAGNOSIS — M25651 Stiffness of right hip, not elsewhere classified: Secondary | ICD-10-CM

## 2014-12-30 DIAGNOSIS — M545 Low back pain: Secondary | ICD-10-CM | POA: Diagnosis not present

## 2014-12-30 DIAGNOSIS — R269 Unspecified abnormalities of gait and mobility: Secondary | ICD-10-CM

## 2014-12-30 DIAGNOSIS — IMO0001 Reserved for inherently not codable concepts without codable children: Secondary | ICD-10-CM

## 2014-12-30 DIAGNOSIS — R29898 Other symptoms and signs involving the musculoskeletal system: Secondary | ICD-10-CM

## 2014-12-30 NOTE — Therapy (Signed)
[place], [place], [address] Phone: [phone]   Fax:  [phone]  Physical Therapy Treatment  Patient Details  Name: Nicole Meadows MRN: [id number] Date of Birth: [birth year] Referring Provider:  Lucianne Lei, MD  Encounter Date: 12/30/2014      PT End of Session - 12/30/14 1045    Visit Number 15   Number of Visits 16   Date for PT Re-Evaluation 01/06/15   Authorization Type UHC/Medicare   Authorization Time Period 11/06/14 - 01/06/15; G-code done 10th session   Authorization - Visit Number 14   Authorization - Number of Visits 20   PT Start Time 0936   PT Stop Time 1015   PT Time Calculation (min) 39 min   Activity Tolerance Patient tolerated treatment well   Behavior During Therapy [facility] for tasks assessed/performed      Past Medical History  Diagnosis Date  . Tremor   . Vaginal dryness   . Fibroid   . Hypertension   . Coronary artery disease     moderate, by cath  . History of nuclear stress test 05/2010    bruce myoview; small fixed distal anteroapical, apical defect (breast attenuation or apical thinning), no reversible ischemia, post-stress EF 83%, abnormal but low risk study   . Dyslipidemia   . Anemia   . Arthritis   . Glaucoma   . Hyperlipidemia     Past Surgical History  Procedure Laterality Date  . Abdominal hysterectomy  1995  . Tubal ligation  1977  . Transthoracic echocardiogram  2005    EF 60% with borderline conc LVH; mild TR, RVSP 74mmHg   . Cardiac catheterization  05/31/2007    no signficant CAD, low normal EF (Dr. Gerrie Nordmann)   . Breast surgery      Biopsy negative    There were no vitals filed for this visit.  Visit Diagnosis:  Misalignment of right hip, initial encounter  Abnormality of gait  Hip stiffness, right  Weakness of right lower extremity  Midline low back pain with right-sided sciatica      Subjective Assessment - 12/30/14 0939    Subjective Pt  reports having a good weekend. No immediate questions or complaints with HEP. Reports worse R shoulder pain from previous sessions even though, quadruped exercises were modified.    Pertinent History Patient has had a long history of low back pain particularly with lifting with pain now radiating down Rt thigh and occasionally thoguhout entire leg. H/o of pain >10 years   Currently in Pain? Yes   Pain Score 2    Pain Location Shoulder   Pain Orientation Right   Pain Descriptors / Indicators Aching                         [facility] Adult PT Treatment/Exercise - 12/30/14 0942    Lumbar Exercises: Stretches   Active Hamstring Stretch 3 reps;30 seconds   Active Hamstring Stretch Limitations 14 inch box, 3 way    Hip Flexor Stretch 3 reps;30 seconds   Hip Flexor Stretch Limitations 14in step, ipsilateral overhead reach   Piriformis Stretch 3 reps;30 seconds   Piriformis Stretch Limitations seated piriformis stretch   Lumbar Exercises: Standing   Scapular Retraction Both;Theraband  2x10    Theraband Level (Scapular Retraction) Level 3 (Green)  heavy tactile cues for form correction.    Row Both   Theraband Level (Row) Level 3 (Green)  2x1   Shoulder Extension Both;10 reps;Theraband  2 sets   Theraband Level (Shoulder Extension) Level 2 (Red)   Other Standing Lumbar Exercises Standing overhead rotation/flexion with green physio ball in BUE.    Lumbar Exercises: Supine   Clam 20 reps  2x   Clam Limitations 2x10 c red TB at knees, crook lying    Bridge 20 reps  2x10 with TrAbd contraction   Straight Leg Raise 10 reps   Straight Leg Raises Limitations floating   Other Supine Lumbar Exercises Towel roll under T5  x4 minutes; green physioball BUE overhead mirror taps    Lumbar Exercises: Quadruped   Other Quadruped Lumbar Exercises forward leaning onto green ball; BLE extension  1x10 bilat   Knee/Hip Exercises: Seated   Heel Slides 2 sets;10 reps  manually resisted, bilat  hip abduction                  PT Short Term Goals - 12/23/14 1555    PT SHORT TERM GOAL #1   Title patient will be able to dmeosntrate increased Rt hip extension to 15 degrees with a negative thomas test indicating improved stride length during gait   Status Achieved   PT SHORT TERM GOAL #2   Title Patient will demosntrate a negative Rt piriformis test indicating improve hip mobility for improved deceleration mechanics during gait.    Status Achieved   PT SHORT TERM GOAL #3   Title Patient will demonstrate improved hip alignment with hips maintaining equal alignment between >2 sessions.    Status Achieved   PT SHORT TERM GOAL #4   Title Patient will be independent with HEP   Status Achieved           PT Long Term Goals - 12/23/14 1556    PT LONG TERM GOAL #1   Title Patient will be able to demonstrate bilateral hip extension strength of 4+/5 so patient can squat to the ground to work in her garden.    Status On-going   PT LONG TERM GOAL #2   Title Patint will dmoenstrate increased abdominal muscle strength of 5/5 MMT indicating improved trunk stability to allow patient to bend over whle working in garden withtou pain.    Status On-going   PT LONG TERM GOAL #3   Title Patint will demonstrate increased hip abduction strength to decrease trendelenberg gait.    Status On-going   PT LONG TERM GOAL #4   Title patient will be able to work in garden >30 minutes with pain <2/10   Status Achieved               Plan - 12/30/14 1046    Clinical Impression Statement Pt tolerating treatment session well, motivated and able to complete entire PT sesssion as planned. Pt continues to make progress toward goals as evidenced by improved strength and activity tolerance during PT session. Pt's greatest limitation continues to be R shoulder pain which continues to limit ability to perform certain therex and overhead activities at baseline function. Patient presenting with  impairment of strength, pain, range of motion, balance, and activity tolerance, limiting ability to perform ADL and mobility tasks at  baseline level of function. Patient will benefit from skilled intervention to address the above impairments and limitations, in order to restore to prior level of function, improve patient safety upon discharge, and to decrease caregiver burden.   Pt will benefit from skilled therapeutic intervention in order to improve on the following deficits Abnormal  gait;Decreased endurance;Improper body mechanics;Decreased strength;Impaired flexibility;Postural dysfunction;Decreased activity tolerance;Pain;Decreased range of motion   Rehab Potential Good   PT Frequency 2x / week   PT Duration 8 weeks   PT Treatment/Interventions Gait training;Patient/family education;Functional mobility training;Therapeutic exercise;Therapeutic activities;Manual techniques;Balance training   PT Next Visit Plan Continue with current PT POC to imprve hip mobility with Lt hip IR, functional strengthening and core/posture strengthening activities.  Assess correct form and technique with theraband, continue UE overhead matrix, 4 way pick up.   PT Home Exercise Plan Same:    Consulted and Agree with Plan of Care Patient        Problem List Patient Active Problem List   Diagnosis Date Noted  . HTN (hypertension) 07/08/2013  . Dyslipidemia 07/08/2013  . Tremor     Kristeena Meineke C 12/30/2014, 10:49 AM 10:49 AM  Etta Grandchild, PT, DPT [place] License # [id number]      [facility] [address] [place], [place], [address] Phone: [phone]   Fax:  [phone]

## 2014-12-30 NOTE — Patient Instructions (Signed)
Pt encouraged to utilize towel roll stretch at home if she finds it to be of benefit.

## 2015-01-01 ENCOUNTER — Telehealth (HOSPITAL_COMMUNITY): Payer: Self-pay

## 2015-01-01 ENCOUNTER — Ambulatory Visit (HOSPITAL_COMMUNITY): Payer: Medicare Other

## 2015-01-01 DIAGNOSIS — R269 Unspecified abnormalities of gait and mobility: Secondary | ICD-10-CM

## 2015-01-01 DIAGNOSIS — R29898 Other symptoms and signs involving the musculoskeletal system: Secondary | ICD-10-CM

## 2015-01-01 DIAGNOSIS — M5441 Lumbago with sciatica, right side: Secondary | ICD-10-CM

## 2015-01-01 DIAGNOSIS — S73004A Unspecified dislocation of right hip, initial encounter: Principal | ICD-10-CM

## 2015-01-01 DIAGNOSIS — M545 Low back pain: Secondary | ICD-10-CM | POA: Diagnosis not present

## 2015-01-01 DIAGNOSIS — M25651 Stiffness of right hip, not elsewhere classified: Secondary | ICD-10-CM

## 2015-01-01 DIAGNOSIS — IMO0001 Reserved for inherently not codable concepts without codable children: Secondary | ICD-10-CM

## 2015-01-01 NOTE — Therapy (Addendum)
PHYSICAL THERAPY DISCHARGE SUMMARY  Visits from Start of Care: 16  Current functional level related to goals / functional outcomes: *see below   Remaining deficits: *see below   Education / Equipment: *see below  Plan: Patient agrees to discharge.  Patient goals were met. Patient is being discharged due to meeting the stated rehab goals.  ?????   4:50 PM, 09/16/2015 Etta Grandchild, PT, DPT PRN Physical Therapist at [facility] # [id number] [phone] (office)      [place], [place], [address] Phone: [phone]   Fax:  [phone]  Physical Therapy Treatment  Patient Details  Name: Nicole Meadows MRN: [id number] Date of Birth: 10-01-1940 Referring Provider:  Lucianne Lei, MD  Encounter Date: 01/01/2015      PT End of Session - 01/01/15 1243    Visit Number 16   Number of Visits 16   Date for PT Re-Evaluation 01/06/15   Authorization Type UHC/Medicare   Authorization Time Period 11/06/14 - 01/06/15; G-code done 10th session   Authorization - Visit Number 16   Authorization - Number of Visits 20   PT Start Time [phone]   PT Stop Time 1018   PT Time Calculation (min) 40 min   Activity Tolerance Patient tolerated treatment well;No increased pain   Behavior During Therapy [facility] for tasks assessed/performed      Past Medical History  Diagnosis Date  . Tremor   . Vaginal dryness   . Fibroid   . Hypertension   . Coronary artery disease     moderate, by cath  . History of nuclear stress test 05/2010    bruce myoview; small fixed distal anteroapical, apical defect (breast attenuation or apical thinning), no reversible ischemia, post-stress EF 83%, abnormal but low risk study   . Dyslipidemia   . Anemia   . Arthritis   . Glaucoma   . Hyperlipidemia     Past Surgical History  Procedure Laterality Date  . Abdominal hysterectomy  1995  . Tubal ligation  1977  . Transthoracic  echocardiogram  2005    EF 60% with borderline conc LVH; mild TR, RVSP 63mHg   . Cardiac catheterization  05/31/2007    no signficant CAD, low normal EF (Dr. RGerrie Nordmann   . Breast surgery      Biopsy negative    There were no vitals filed for this visit.  Visit Diagnosis:  Misalignment of right hip, initial encounter  Abnormality of gait  Hip stiffness, right  Weakness of right lower extremity  Midline low back pain with right-sided sciatica      Subjective Assessment - 01/01/15 0941    Subjective Pt reports she is feeling fine. She reports having some R upper neck pain, but otherwise doing well. HEP program is going well.    Pertinent History Patient has had a long history of low back pain particularly with lifting with pain now radiating down Rt thigh and occasionally thoguhout entire leg. H/o of pain >10 years   How long can you sit comfortably? "at least an hour"   How long can you stand comfortably? "at least an hour". reports no change since 1 month ago.     How long can you walk comfortably? "at least an hour"   Patient Stated Goals to decrease pain frequency and severity especially while working in garden.    Currently in Pain? Yes   Pain Score 3    Pain Location  Neck  attributes to sleeping funny.   Pain Orientation Right   Pain Type Acute pain   Pain Radiating Towards head/shoulder    Pain Onset Today   Pain Frequency Constant   Multiple Pain Sites No                         [facility] Adult PT Treatment/Exercise - 01/01/15 0954    Lumbar Exercises: Standing   Scapular Retraction Both;Theraband;10 reps  2x10    Theraband Level (Scapular Retraction) Level 2 (Red)  heavy tactile cues for form correction.    Row Both;10 reps   Theraband Level (Row) Level 2 (Red)  2x1   Shoulder Extension Both;10 reps;Theraband  2 sets   Theraband Level (Shoulder Extension) Level 2 (Red)   Lumbar Exercises: Supine   Clam 20 reps  2x   Clam Limitations 2x10 c  red TB at knees, crooklying    Bridge 20 reps  2x10 with TrAbd contraction   Bridge Limitations c red TB around knees, sustained ER    Manual Therapy   Manual Therapy Myofascial release  R upper Traps, 10 minutes                  PT Short Term Goals - 01/01/15 0946    PT SHORT TERM GOAL #1   Title patient will be able to dmeosntrate increased Rt hip extension to 15 degrees with a negative thomas test indicating improved stride length during gait   Time 4   Period Weeks   Status Achieved   PT SHORT TERM GOAL #2   Title Patient will demosntrate a negative Rt piriformis test indicating improve hip mobility for improved deceleration mechanics during gait.    Time 4   Period Weeks   Status Achieved   PT SHORT TERM GOAL #3   Title Patient will demonstrate improved hip alignment with hips maintaining equal alignment between >2 sessions.    Time 4   Period Weeks   Status Achieved   PT SHORT TERM GOAL #4   Title Patient will be independent with HEP   Baseline Reports compliance daily   Time 4   Period Weeks   Status Achieved           PT Long Term Goals - 01/01/15 8546    PT LONG TERM GOAL #1   Title Patient will be able to demonstrate bilateral hip extension strength of 4+/5 so patient can squat to the ground to work in her garden.    Time 8   Period Weeks   Status Achieved   PT LONG TERM GOAL #2   Title Patint will demonstrate increased abdominal muscle strength of 5/5 MMT indicating improved trunk stability to allow patient to bend over whle working in garden withtou pain.    Time 8   Period Weeks   Status Achieved   PT LONG TERM GOAL #3   Title Patint will demonstrate increased hip abduction strength to decrease trendelenberg gait.    Time 8   Period Weeks   Status --  5/5 strength bilat in glute med.    PT LONG TERM GOAL #4   Title patient will be able to work in garden >30 minutes with pain <2/10   Time 8   Period Weeks   Status Achieved                Plan - 01/01/15 1244    Clinical Impression Statement Pt reports  no pain related to reason for visit today, otherwise some mild neck/shoulder pain that has started this morning. Pt reports she is indep in HEP, and returning to housework/gardening as ususall. PT demonstrating achieve of goals and no longer necessitates skilled therapy services at this time.    Pt will benefit from skilled therapeutic intervention in order to improve on the following deficits Abnormal gait;Decreased endurance;Improper body mechanics;Decreased strength;Impaired flexibility;Postural dysfunction;Decreased activity tolerance;Pain;Decreased range of motion   Rehab Potential Good   PT Frequency 2x / week   PT Duration 8 weeks   PT Treatment/Interventions Gait training;Patient/family education;Functional mobility training;Therapeutic exercise;Therapeutic activities;Manual techniques;Balance training   PT Home Exercise Plan Same:    Consulted and Agree with Plan of Care Patient          G-Codes - 2015-01-28 1240    Functional Assessment Tool Used Clinical Judgment   Functional Limitation Changing and maintaining body position   Changing and Maintaining Body Position Current Status (N9987) At least 20 percent but less than 40 percent impaired, limited or restricted   Changing and Maintaining Body Position Goal Status (A1587) At least 1 percent but less than 20 percent impaired, limited or restricted   Changing and Maintaining Body Position Discharge Status (G7618) At least 1 percent but less than 20 percent impaired, limited or restricted      Problem List Patient Active Problem List   Diagnosis Date Noted  . HTN (hypertension) 07/08/2013  . Dyslipidemia 07/08/2013  . Tremor     Buccola,Allan C 01-28-2015, 12:53 PM  12:53 PM  Etta Grandchild, PT, DPT [place] License # [id number]       [facility] [address] [place], [place], [address] Phone:  [phone]   Fax:  [phone]

## 2015-01-05 ENCOUNTER — Encounter (HOSPITAL_COMMUNITY): Payer: Medicare Other | Admitting: Physical Therapy

## 2015-02-24 ENCOUNTER — Encounter (HOSPITAL_COMMUNITY)
Admission: RE | Admit: 2015-02-24 | Discharge: 2015-02-24 | Disposition: A | Discharge status: Home / Self Care | Payer: Medicare Other | Source: Ambulatory Visit | Attending: Ophthalmology | Admitting: Ophthalmology

## 2015-02-24 ENCOUNTER — Encounter (HOSPITAL_COMMUNITY): Payer: Self-pay

## 2015-02-24 DIAGNOSIS — H2511 Age-related nuclear cataract, right eye: Secondary | ICD-10-CM | POA: Insufficient documentation

## 2015-02-24 DIAGNOSIS — Z01818 Encounter for other preprocedural examination: Secondary | ICD-10-CM | POA: Insufficient documentation

## 2015-02-24 LAB — CBC WITH DIFFERENTIAL/PLATELET
Basophils Absolute: 0 10*3/uL (ref 0.0–0.1)
Basophils Relative: 1 % (ref 0–1)
Eosinophils Absolute: 0.1 10*3/uL (ref 0.0–0.7)
Eosinophils Relative: 2 % (ref 0–5)
HCT: 40.5 % (ref 36.0–46.0)
Hemoglobin: 13.2 g/dL (ref 12.0–15.0)
Lymphocytes Relative: 33 % (ref 12–46)
Lymphs Abs: 1.6 10*3/uL (ref 0.7–4.0)
MCH: 29.5 pg (ref 26.0–34.0)
MCHC: 32.6 g/dL (ref 30.0–36.0)
MCV: 90.4 fL (ref 78.0–100.0)
Monocytes Absolute: 0.5 10*3/uL (ref 0.1–1.0)
Monocytes Relative: 9 % (ref 3–12)
Neutro Abs: 2.7 10*3/uL (ref 1.7–7.7)
Neutrophils Relative %: 55 % (ref 43–77)
Platelets: 253 10*3/uL (ref 150–400)
RBC: 4.48 MIL/uL (ref 3.87–5.11)
RDW: 13.5 % (ref 11.5–15.5)
WBC: 4.8 10*3/uL (ref 4.0–10.5)

## 2015-02-24 LAB — BASIC METABOLIC PANEL
Anion gap: 8 (ref 5–15)
BUN: 15 mg/dL (ref 6–20)
CO2: 26 mmol/L (ref 22–32)
Calcium: 10.7 mg/dL — ABNORMAL HIGH (ref 8.9–10.3)
Chloride: 104 mmol/L (ref 101–111)
Creatinine, Ser: 0.81 mg/dL (ref 0.44–1.00)
GFR calc Af Amer: >60 mL/min (ref >60)
GFR calc non Af Amer: >60 mL/min (ref >60)
Glucose, Bld: 108 mg/dL — ABNORMAL HIGH (ref 65–99)
Potassium: 3.9 mmol/L (ref 3.5–5.1)
Sodium: 138 mmol/L (ref 135–145)

## 2015-02-24 NOTE — Patient Instructions (Signed)
Nicole Meadows  02/24/2015     @PREFPERIOPPHARMACY @   Your procedure is scheduled on 03/02/2015  Report to [facility] at [email or web address]M.  Call this number if you have problems the morning of surgery:  [phone]   Remember:  Do not eat food or drink liquids after midnight.  Take these medicines the morning of surgery with A SIP OF WATER Celexa, Hydrocodone, Protonix, Micardis   Do not wear jewelry, make-up or nail polish.  Do not wear lotions, powders, or perfumes.  You may wear deodorant.  Do not shave 48 hours prior to surgery.  Men may shave face and neck.  Do not bring valuables to the hospital.  [facility] is not responsible for any belongings or valuables.  Contacts, dentures or bridgework may not be worn into surgery.  Leave your suitcase in the car.  After surgery it may be brought to your room.  For patients admitted to the hospital, discharge time will be determined by your treatment team.  Patients discharged the day of surgery will not be allowed to drive home.    Please read over the following fact sheets that you were given. Anesthesia Post-op Instructions     PATIENT INSTRUCTIONS POST-ANESTHESIA  IMMEDIATELY FOLLOWING SURGERY:  Do not drive or operate machinery for the first twenty four hours after surgery.  Do not make any important decisions for twenty four hours after surgery or while taking narcotic pain medications or sedatives.  If you develop intractable nausea and vomiting or a severe headache please notify your doctor immediately.  FOLLOW-UP:  Please make an appointment with your surgeon as instructed. You do not need to follow up with anesthesia unless specifically instructed to do so.  WOUND CARE INSTRUCTIONS (if applicable):  Keep a dry clean dressing on the anesthesia/puncture wound site if there is drainage.  Once the wound has quit draining you may leave it open to air.  Generally you should leave the bandage intact for twenty four hours unless  there is drainage.  If the epidural site drains for more than 36-48 hours please call the anesthesia department.  QUESTIONS?:  Please feel free to call your physician or the hospital operator if you have any questions, and they will be happy to assist you.      Cataract Surgery  A cataract is a clouding of the lens of the eye. When a lens becomes cloudy, vision is reduced based on the degree and nature of the clouding. Surgery may be needed to improve vision. Surgery removes the cloudy lens and usually replaces it with a substitute lens (intraocular lens, IOL). LET YOUR EYE DOCTOR KNOW ABOUT:  Allergies to food or medicine.  Medicines taken including herbs, eye drops, over-the-counter medicines, and creams.  Use of steroids (by mouth or creams).  Previous problems with anesthetics or numbing medicine.  History of bleeding problems or blood clots.  Previous surgery.  Other health problems, including diabetes and kidney problems.  Possibility of pregnancy, if this applies. RISKS AND COMPLICATIONS  Infection.  Inflammation of the eyeball (endophthalmitis) that can spread to both eyes (sympathetic ophthalmia).  Poor wound healing.  If an IOL is inserted, it can later fall out of proper position. This is very uncommon.  Clouding of the part of your eye that holds an IOL in place. This is called an "after-cataract." These are uncommon but easily treated. BEFORE THE PROCEDURE  Do not eat or drink anything except small amounts of water for 8 to  12 before your surgery, or as directed by your caregiver.  Unless you are told otherwise, continue any eye drops you have been prescribed.  Talk to your primary caregiver about all other medicines that you take (both prescription and nonprescription). In some cases, you may need to stop or change medicines near the time of your surgery. This is most important if you are taking blood-thinning medicine.Do not stop medicines unless you are told  to do so.  Arrange for someone to drive you to and from the procedure.  Do not put contact lenses in either eye on the day of your surgery. PROCEDURE There is more than one method for safely removing a cataract. Your doctor can explain the differences and help determine which is best for you. Phacoemulsification surgery is the most common form of cataract surgery.  An injection is given behind the eye or eye drops are given to make this a painless procedure.  A small cut (incision) is made on the edge of the clear, dome-shaped surface that covers the front of the eye (cornea).  A tiny probe is painlessly inserted into the eye. This device gives off ultrasound waves that soften and break up the cloudy center of the lens. This makes it easier for the cloudy lens to be removed by suction.  An IOL may be implanted.  The normal lens of the eye is covered by a clear capsule. Part of that capsule is intentionally left in the eye to support the IOL.  Your surgeon may or may not use stitches to close the incision. There are other forms of cataract surgery that require a larger incision and stitches to close the eye. This approach is taken in cases where the doctor feels that the cataract cannot be easily removed using phacoemulsification. AFTER THE PROCEDURE  When an IOL is implanted, it does not need care. It becomes a permanent part of your eye and cannot be seen or felt.  Your doctor will schedule follow-up exams to check on your progress.  Review your other medicines with your doctor to see which can be resumed after surgery.  Use eye drops or take medicine as prescribed by your doctor. Document Released: 06/30/2011 Document Revised: 11/25/2013 Document Reviewed: 06/30/2011 [facility] Patient Information 2015 [place], [place]. This information is not intended to replace advice given to you by your health care provider. Make sure you discuss any questions you have with your health care  provider.

## 2015-02-27 MED ORDER — LIDOCAINE HCL 3.5 % OP GEL
OPHTHALMIC | Status: AC
  Filled 2015-02-27: qty 1

## 2015-02-27 MED ORDER — NEOMYCIN-POLYMYXIN-DEXAMETH 3.5-10000-0.1 OP SUSP
OPHTHALMIC | Status: AC
  Filled 2015-02-27: qty 5

## 2015-02-27 MED ORDER — TETRACAINE HCL 0.5 % OP SOLN
OPHTHALMIC | Status: AC
  Filled 2015-02-27: qty 2

## 2015-02-27 MED ORDER — LIDOCAINE HCL (PF) 1 % IJ SOLN
INTRAMUSCULAR | Status: AC
  Filled 2015-02-27: qty 2

## 2015-02-27 MED ORDER — CYCLOPENTOLATE-PHENYLEPHRINE OP SOLN OPTIME - NO CHARGE
OPHTHALMIC | Status: AC
  Filled 2015-02-27: qty 2

## 2015-02-27 MED ORDER — PHENYLEPHRINE HCL 2.5 % OP SOLN
OPHTHALMIC | Status: AC
  Filled 2015-02-27: qty 15

## 2015-03-02 ENCOUNTER — Ambulatory Visit (HOSPITAL_COMMUNITY): Payer: Medicare Other | Admitting: Anesthesiology

## 2015-03-02 ENCOUNTER — Ambulatory Visit (HOSPITAL_COMMUNITY)
Admission: RE | Admit: 2015-03-02 | Discharge: 2015-03-02 | Disposition: A | Discharge status: Home / Self Care | Payer: Medicare Other | Source: Ambulatory Visit | Attending: Ophthalmology | Admitting: Ophthalmology

## 2015-03-02 ENCOUNTER — Encounter (HOSPITAL_COMMUNITY): Payer: Self-pay | Admitting: *Deleted

## 2015-03-02 ENCOUNTER — Encounter (HOSPITAL_COMMUNITY)
Admission: RE | Disposition: A | Discharge status: Home / Self Care | Payer: Self-pay | Source: Ambulatory Visit | Attending: Ophthalmology

## 2015-03-02 DIAGNOSIS — Z7982 Long term (current) use of aspirin: Secondary | ICD-10-CM | POA: Insufficient documentation

## 2015-03-02 DIAGNOSIS — Z7951 Long term (current) use of inhaled steroids: Secondary | ICD-10-CM | POA: Insufficient documentation

## 2015-03-02 DIAGNOSIS — I10 Essential (primary) hypertension: Secondary | ICD-10-CM | POA: Diagnosis not present

## 2015-03-02 DIAGNOSIS — Z79899 Other long term (current) drug therapy: Secondary | ICD-10-CM | POA: Insufficient documentation

## 2015-03-02 DIAGNOSIS — H25811 Combined forms of age-related cataract, right eye: Secondary | ICD-10-CM | POA: Diagnosis not present

## 2015-03-02 HISTORY — PX: CATARACT EXTRACTION W/PHACO: SHX586

## 2015-03-02 SURGERY — PHACOEMULSIFICATION, CATARACT, WITH IOL INSERTION
Anesthesia: Monitor Anesthesia Care | Site: Eye | Laterality: Right

## 2015-03-02 MED ORDER — POVIDONE-IODINE 5 % OP SOLN
OPHTHALMIC | Status: DC | PRN
  Administered 2015-03-02: 1 via OPHTHALMIC

## 2015-03-02 MED ORDER — EPINEPHRINE HCL 1 MG/ML IJ SOLN
INTRAMUSCULAR | Status: AC
  Filled 2015-03-02: qty 1

## 2015-03-02 MED ORDER — CYCLOPENTOLATE-PHENYLEPHRINE 0.2-1 % OP SOLN
1.0000 [drp] | OPHTHALMIC | Status: AC
  Administered 2015-03-02 (×3): 1 [drp] via OPHTHALMIC

## 2015-03-02 MED ORDER — TETRACAINE HCL 0.5 % OP SOLN
1.0000 [drp] | OPHTHALMIC | Status: AC
  Administered 2015-03-02 (×3): 1 [drp] via OPHTHALMIC

## 2015-03-02 MED ORDER — BSS IO SOLN
INTRAOCULAR | Status: DC | PRN
  Administered 2015-03-02: 500 mL

## 2015-03-02 MED ORDER — LACTATED RINGERS IV SOLN
INTRAVENOUS | Status: DC
  Administered 2015-03-02: 1000 mL via INTRAVENOUS

## 2015-03-02 MED ORDER — NEOMYCIN-POLYMYXIN-DEXAMETH 3.5-10000-0.1 OP SUSP
OPHTHALMIC | Status: DC | PRN
  Administered 2015-03-02: 2 [drp] via OPHTHALMIC

## 2015-03-02 MED ORDER — FENTANYL CITRATE (PF) 100 MCG/2ML IJ SOLN
INTRAMUSCULAR | Status: AC
  Filled 2015-03-02: qty 2

## 2015-03-02 MED ORDER — PROVISC 10 MG/ML IO SOLN
INTRAOCULAR | Status: DC | PRN
  Administered 2015-03-02: 0.85 mL via INTRAOCULAR

## 2015-03-02 MED ORDER — BSS IO SOLN
INTRAOCULAR | Status: DC | PRN
  Administered 2015-03-02: 15 mL

## 2015-03-02 MED ORDER — MIDAZOLAM HCL 2 MG/2ML IJ SOLN
1.0000 mg | INTRAMUSCULAR | Status: DC | PRN
  Administered 2015-03-02: 2 mg via INTRAVENOUS

## 2015-03-02 MED ORDER — LIDOCAINE HCL 3.5 % OP GEL
1.0000 | Freq: Once | OPHTHALMIC | Status: AC
Start: 2015-03-02 — End: 2015-03-02
  Administered 2015-03-02: 1 via OPHTHALMIC

## 2015-03-02 MED ORDER — MIDAZOLAM HCL 2 MG/2ML IJ SOLN
INTRAMUSCULAR | Status: AC
  Filled 2015-03-02: qty 2

## 2015-03-02 MED ORDER — FENTANYL CITRATE (PF) 100 MCG/2ML IJ SOLN
25.0000 ug | INTRAMUSCULAR | Status: AC
  Administered 2015-03-02 (×2): 25 ug via INTRAVENOUS

## 2015-03-02 MED ORDER — LIDOCAINE HCL (PF) 1 % IJ SOLN
INTRAMUSCULAR | Status: DC | PRN
  Administered 2015-03-02: .7 mL

## 2015-03-02 MED ORDER — PHENYLEPHRINE HCL 2.5 % OP SOLN
1.0000 [drp] | OPHTHALMIC | Status: AC
  Administered 2015-03-02 (×3): 1 [drp] via OPHTHALMIC

## 2015-03-02 SURGICAL SUPPLY — 11 items
CLOTH BEACON ORANGE TIMEOUT ST (SAFETY) ×2 IMPLANT
EYE SHIELD UNIVERSAL CLEAR (GAUZE/BANDAGES/DRESSINGS) ×2 IMPLANT
GLOVE BIOGEL PI IND STRL 7.0 (GLOVE) ×2 IMPLANT
GLOVE BIOGEL PI INDICATOR 7.0 (GLOVE) ×2
GLOVE SURG SS PI 7.5 STRL IVOR (GLOVE) ×2 IMPLANT
PAD ARMBOARD 7.5X6 YLW CONV (MISCELLANEOUS) ×2 IMPLANT
SIGHTPATH CAT PROC W REG LENS (Ophthalmic Related) ×2 IMPLANT
SYRINGE LUER LOK 1CC (MISCELLANEOUS) ×2 IMPLANT
TAPE SURG TRANSPORE 1 IN (GAUZE/BANDAGES/DRESSINGS) ×1 IMPLANT
TAPE SURGICAL TRANSPORE 1 IN (GAUZE/BANDAGES/DRESSINGS) ×1
WATER STERILE IRR 250ML POUR (IV SOLUTION) ×2 IMPLANT

## 2015-03-02 NOTE — Addendum Note (Signed)
Addendum  created 03/02/15 0818 by Charmaine Downs, CRNA   Modules edited: Anesthesia Attestations

## 2015-03-02 NOTE — Anesthesia Postprocedure Evaluation (Signed)
  Anesthesia Post-op Note  Patient: Nicole Meadows  Procedure(s) Performed: Procedure(s): CATARACT EXTRACTION PHACO AND INTRAOCULAR LENS PLACEMENT RIGHT EYE CDE=5.78 (Right)  Patient Location: Short Stay  Anesthesia Type:MAC  Level of Consciousness: awake, alert , oriented and patient cooperative  Airway and Oxygen Therapy: Patient Spontanous Breathing  Post-op Pain: none  Post-op Assessment: Post-op Vital signs reviewed, Patient's Cardiovascular Status Stable, Respiratory Function Stable, Patent Airway and Pain level controlled              Post-op Vital Signs: Reviewed and stable  Last Vitals:  Filed Vitals:   03/02/15 0725  BP: 89/45  Pulse:   Temp:   Resp: 12    Complications: No apparent anesthesia complications

## 2015-03-02 NOTE — H&P (Signed)
I have reviewed the H&P, the patient was re-examined, and I have identified no interval changes in medical condition and plan of care since the history and physical of record  

## 2015-03-02 NOTE — Anesthesia Preprocedure Evaluation (Signed)
Anesthesia Evaluation  Patient identified by MRN, date of birth, ID band Patient awake    Reviewed: Allergy & Precautions, NPO status , Patient's Chart, lab work & pertinent test results  Airway Mallampati: II  TM Distance: >3 FB     Dental  (+) Teeth Intact   Pulmonary  breath sounds clear to auscultation        Cardiovascular hypertension, Pt. on medications + CAD Rhythm:Regular Rate:Normal     Neuro/Psych    GI/Hepatic negative GI ROS,   Endo/Other    Renal/GU      Musculoskeletal  (+) Arthritis -,   Abdominal   Peds  Hematology   Anesthesia Other Findings   Reproductive/Obstetrics                             Anesthesia Physical Anesthesia Plan  ASA: II  Anesthesia Plan: MAC   Post-op Pain Management:    Induction: Intravenous  Airway Management Planned: Nasal Cannula  Additional Equipment:   Intra-op Plan:   Post-operative Plan:   Informed Consent: I have reviewed the patients History and Physical, chart, labs and discussed the procedure including the risks, benefits and alternatives for the proposed anesthesia with the patient or authorized representative who has indicated his/her understanding and acceptance.     Plan Discussed with:   Anesthesia Plan Comments:         Anesthesia Quick Evaluation

## 2015-03-02 NOTE — Transfer of Care (Signed)
Immediate Anesthesia Transfer of Care Note  Patient: Nicole Meadows  Procedure(s) Performed: Procedure(s): CATARACT EXTRACTION PHACO AND INTRAOCULAR LENS PLACEMENT RIGHT EYE CDE=5.78 (Right)  Patient Location: Short Stay  Anesthesia Type:MAC  Level of Consciousness: awake, alert  and patient cooperative  Airway & Oxygen Therapy: Patient Spontanous Breathing  Post-op Assessment: Report given to RN, Post -op Vital signs reviewed and stable and Patient moving all extremities  Post vital signs: Reviewed and stable  Last Vitals:  Filed Vitals:   03/02/15 0725  BP: 89/45  Pulse:   Temp:   Resp: 12    Complications: No apparent anesthesia complications

## 2015-03-02 NOTE — Discharge Instructions (Signed)

## 2015-03-02 NOTE — Op Note (Signed)
Date of Admission: 03/02/2015  Date of Surgery: 03/02/2015   Pre-Op Dx: Cataract Right Eye  Post-Op Dx: Senile Combined Cataract Right  Eye,  Dx Code T51.761  Surgeon: [name], M.D.  Assistants: None  Anesthesia: Topical with MAC  Indications: Painless, progressive loss of vision with compromise of daily activities.  Surgery: Cataract Extraction with Intraocular lens Implant Right Eye  Discription: The patient had dilating drops and viscous lidocaine placed into the Right eye in the pre-op holding area. After transfer to the operating room, a time out was performed. The patient was then prepped and draped. Beginning with a 24 degree blade a paracentesis port was made at the surgeon's 2 o'clock position. The anterior chamber was then filled with 1% non-preserved lidocaine. This was followed by filling the anterior chamber with Provisc.  A 2.65mm keratome blade was used to make a clear corneal incision at the temporal limbus.  A bent cystatome needle was used to create a continuous tear capsulotomy. Hydrodissection was performed with balanced salt solution on a Fine canula. The lens nucleus was then removed using the phacoemulsification handpiece. Residual cortex was removed with the I&A handpiece. The anterior chamber and capsular bag were refilled with Provisc. A posterior chamber intraocular lens was placed into the capsular bag with it's injector. The implant was positioned with the Kuglan hook. The Provisc was then removed from the anterior chamber and capsular bag with the I&A handpiece. Stromal hydration of the main incision and paracentesis port was performed with BSS on a Fine canula. The wounds were tested for leak which was negative. The patient tolerated the procedure well. There were no operative complications. The patient was then transferred to the recovery room in stable condition.  Complications: None  Specimen: None  EBL: None  Prosthetic device: Hoya iSert 250, power 17.5 D,  SN [id number].

## 2015-03-03 ENCOUNTER — Encounter (HOSPITAL_COMMUNITY): Payer: Self-pay | Admitting: Ophthalmology

## 2015-03-16 ENCOUNTER — Encounter (HOSPITAL_COMMUNITY)
Admission: RE | Admit: 2015-03-16 | Discharge: 2015-03-16 | Disposition: A | Discharge status: Home / Self Care | Payer: Medicare Other | Source: Ambulatory Visit | Attending: Ophthalmology | Admitting: Ophthalmology

## 2015-03-16 ENCOUNTER — Encounter (HOSPITAL_COMMUNITY): Payer: Self-pay

## 2015-03-18 MED ORDER — LIDOCAINE HCL (PF) 1 % IJ SOLN
INTRAMUSCULAR | Status: AC
  Filled 2015-03-18: qty 2

## 2015-03-18 MED ORDER — PHENYLEPHRINE HCL 2.5 % OP SOLN
OPHTHALMIC | Status: AC
Start: 2015-03-18 — End: 2015-03-18
  Filled 2015-03-18: qty 15

## 2015-03-18 MED ORDER — TETRACAINE HCL 0.5 % OP SOLN
OPHTHALMIC | Status: AC
  Filled 2015-03-18: qty 2

## 2015-03-18 MED ORDER — CYCLOPENTOLATE-PHENYLEPHRINE OP SOLN OPTIME - NO CHARGE
OPHTHALMIC | Status: AC
  Filled 2015-03-18: qty 2

## 2015-03-18 MED ORDER — NEOMYCIN-POLYMYXIN-DEXAMETH 3.5-10000-0.1 OP SUSP
OPHTHALMIC | Status: AC
  Filled 2015-03-18: qty 5

## 2015-03-18 MED ORDER — LIDOCAINE HCL 3.5 % OP GEL
OPHTHALMIC | Status: AC
  Filled 2015-03-18: qty 1

## 2015-03-19 ENCOUNTER — Ambulatory Visit (HOSPITAL_COMMUNITY): Payer: Medicare Other | Admitting: Anesthesiology

## 2015-03-19 ENCOUNTER — Encounter (HOSPITAL_COMMUNITY)
Admission: RE | Disposition: A | Discharge status: Home / Self Care | Payer: Self-pay | Source: Ambulatory Visit | Attending: Ophthalmology

## 2015-03-19 ENCOUNTER — Ambulatory Visit (HOSPITAL_COMMUNITY)
Admission: RE | Admit: 2015-03-19 | Discharge: 2015-03-19 | Disposition: A | Discharge status: Home / Self Care | Payer: Medicare Other | Source: Ambulatory Visit | Attending: Ophthalmology | Admitting: Ophthalmology

## 2015-03-19 ENCOUNTER — Encounter (HOSPITAL_COMMUNITY): Payer: Self-pay | Admitting: *Deleted

## 2015-03-19 DIAGNOSIS — E119 Type 2 diabetes mellitus without complications: Secondary | ICD-10-CM | POA: Diagnosis not present

## 2015-03-19 DIAGNOSIS — I10 Essential (primary) hypertension: Secondary | ICD-10-CM | POA: Insufficient documentation

## 2015-03-19 DIAGNOSIS — H25812 Combined forms of age-related cataract, left eye: Secondary | ICD-10-CM | POA: Insufficient documentation

## 2015-03-19 DIAGNOSIS — K219 Gastro-esophageal reflux disease without esophagitis: Secondary | ICD-10-CM | POA: Insufficient documentation

## 2015-03-19 DIAGNOSIS — Z7982 Long term (current) use of aspirin: Secondary | ICD-10-CM | POA: Diagnosis not present

## 2015-03-19 DIAGNOSIS — Z79899 Other long term (current) drug therapy: Secondary | ICD-10-CM | POA: Diagnosis not present

## 2015-03-19 HISTORY — PX: CATARACT EXTRACTION W/PHACO: SHX586

## 2015-03-19 SURGERY — PHACOEMULSIFICATION, CATARACT, WITH IOL INSERTION
Anesthesia: Monitor Anesthesia Care | Site: Eye | Laterality: Left

## 2015-03-19 MED ORDER — LACTATED RINGERS IV SOLN
INTRAVENOUS | Status: DC
  Administered 2015-03-19: 10:00:00 via INTRAVENOUS

## 2015-03-19 MED ORDER — FENTANYL CITRATE (PF) 100 MCG/2ML IJ SOLN
INTRAMUSCULAR | Status: AC
  Filled 2015-03-19: qty 2

## 2015-03-19 MED ORDER — FENTANYL CITRATE (PF) 100 MCG/2ML IJ SOLN
25.0000 ug | INTRAMUSCULAR | Status: AC
  Administered 2015-03-19 (×2): 25 ug via INTRAVENOUS

## 2015-03-19 MED ORDER — CYCLOPENTOLATE-PHENYLEPHRINE 0.2-1 % OP SOLN
1.0000 [drp] | OPHTHALMIC | Status: AC
  Administered 2015-03-19 (×3): 1 [drp] via OPHTHALMIC

## 2015-03-19 MED ORDER — NEOMYCIN-POLYMYXIN-DEXAMETH 3.5-10000-0.1 OP SUSP
OPHTHALMIC | Status: DC | PRN
  Administered 2015-03-19: 2 [drp] via OPHTHALMIC

## 2015-03-19 MED ORDER — LIDOCAINE HCL 3.5 % OP GEL
1.0000 "application " | Freq: Once | OPHTHALMIC | Status: AC
  Administered 2015-03-19: 1 via OPHTHALMIC

## 2015-03-19 MED ORDER — LIDOCAINE HCL (PF) 1 % IJ SOLN
INTRAMUSCULAR | Status: DC | PRN
  Administered 2015-03-19: .5 mL

## 2015-03-19 MED ORDER — PROVISC 10 MG/ML IO SOLN
INTRAOCULAR | Status: DC | PRN
  Administered 2015-03-19: 0.85 mL via INTRAOCULAR

## 2015-03-19 MED ORDER — BSS IO SOLN
INTRAOCULAR | Status: DC | PRN
  Administered 2015-03-19: 15 mL

## 2015-03-19 MED ORDER — PHENYLEPHRINE HCL 2.5 % OP SOLN
1.0000 [drp] | OPHTHALMIC | Status: AC
  Administered 2015-03-19 (×3): 1 [drp] via OPHTHALMIC

## 2015-03-19 MED ORDER — MIDAZOLAM HCL 2 MG/2ML IJ SOLN
INTRAMUSCULAR | Status: AC
  Filled 2015-03-19: qty 2

## 2015-03-19 MED ORDER — POVIDONE-IODINE 5 % OP SOLN
OPHTHALMIC | Status: DC | PRN
  Administered 2015-03-19: 1 via OPHTHALMIC

## 2015-03-19 MED ORDER — EPINEPHRINE HCL 1 MG/ML IJ SOLN
INTRAOCULAR | Status: DC | PRN
  Administered 2015-03-19: 500 mL

## 2015-03-19 MED ORDER — MIDAZOLAM HCL 2 MG/2ML IJ SOLN
1.0000 mg | INTRAMUSCULAR | Status: DC | PRN
  Administered 2015-03-19: 2 mg via INTRAVENOUS

## 2015-03-19 MED ORDER — TETRACAINE HCL 0.5 % OP SOLN
1.0000 [drp] | OPHTHALMIC | Status: AC
  Administered 2015-03-19 (×3): 1 [drp] via OPHTHALMIC

## 2015-03-19 SURGICAL SUPPLY — 11 items
CLOTH BEACON ORANGE TIMEOUT ST (SAFETY) ×2 IMPLANT
EYE SHIELD UNIVERSAL CLEAR (GAUZE/BANDAGES/DRESSINGS) ×2 IMPLANT
GLOVE BIOGEL PI IND STRL 6.5 (GLOVE) ×1 IMPLANT
GLOVE BIOGEL PI INDICATOR 6.5 (GLOVE) ×1
GLOVE EXAM NITRILE MD LF STRL (GLOVE) ×2 IMPLANT
PAD ARMBOARD 7.5X6 YLW CONV (MISCELLANEOUS) ×2 IMPLANT
SIGHTPATH CAT PROC W REG LENS (Ophthalmic Related) ×2 IMPLANT
SYRINGE LUER LOK 1CC (MISCELLANEOUS) ×2 IMPLANT
TAPE SURG TRANSPORE 1 IN (GAUZE/BANDAGES/DRESSINGS) ×1 IMPLANT
TAPE SURGICAL TRANSPORE 1 IN (GAUZE/BANDAGES/DRESSINGS) ×1
WATER STERILE IRR 250ML POUR (IV SOLUTION) ×2 IMPLANT

## 2015-03-19 NOTE — H&P (Signed)
I have reviewed the H&P, the patient was re-examined, and I have identified no interval changes in medical condition and plan of care since the history and physical of record  

## 2015-03-19 NOTE — Discharge Instructions (Signed)

## 2015-03-19 NOTE — Anesthesia Postprocedure Evaluation (Signed)
  Anesthesia Post-op Note  Patient: Nicole Meadows  Procedure(s) Performed: Procedure(s): CATARACT EXTRACTION PHACO AND INTRAOCULAR LENS PLACEMENT LEFT EYE CDE=5.44 (Left)  Patient Location: Short Stay  Anesthesia Type:MAC  Level of Consciousness: awake, alert , oriented and patient cooperative  Airway and Oxygen Therapy: Patient Spontanous Breathing  Post-op Pain: none  Post-op Assessment: Post-op Vital signs reviewed, Patient's Cardiovascular Status Stable, Respiratory Function Stable, Patent Airway, No signs of Nausea or vomiting, Adequate PO intake and Pain level controlled              Post-op Vital Signs: Reviewed and stable  Last Vitals:  Filed Vitals:   03/19/15 1035  BP: 95/54  Pulse:   Temp:   Resp: 11    Complications: No apparent anesthesia complications

## 2015-03-19 NOTE — Op Note (Signed)
Date of Admission: 03/19/2015  Date of Surgery: 03/19/2015   Pre-Op Dx: Cataract Left Eye  Post-Op Dx: Senile Combined Cataract Left  Eye,  Dx Code T51.761  Surgeon: [name], M.D.  Assistants: None  Anesthesia: Topical with MAC  Indications: Painless, progressive loss of vision with compromise of daily activities.  Surgery: Cataract Extraction with Intraocular lens Implant Left Eye  Discription: The patient had dilating drops and viscous lidocaine placed into the Left eye in the pre-op holding area. After transfer to the operating room, a time out was performed. The patient was then prepped and draped. Beginning with a 64 degree blade a paracentesis port was made at the surgeon's 2 o'clock position. The anterior chamber was then filled with 1% non-preserved lidocaine. This was followed by filling the anterior chamber with Provisc.  A 2.77mm keratome blade was used to make a clear corneal incision at the temporal limbus.  A bent cystatome needle was used to create a continuous tear capsulotomy. Hydrodissection was performed with balanced salt solution on a Fine canula. The lens nucleus was then removed using the phacoemulsification handpiece. Residual cortex was removed with the I&A handpiece. The anterior chamber and capsular bag were refilled with Provisc. A posterior chamber intraocular lens was placed into the capsular bag with it's injector. The implant was positioned with the Kuglan hook. The Provisc was then removed from the anterior chamber and capsular bag with the I&A handpiece. Stromal hydration of the main incision and paracentesis port was performed with BSS on a Fine canula. The wounds were tested for leak which was negative. The patient tolerated the procedure well. There were no operative complications. The patient was then transferred to the recovery room in stable condition.  Complications: None  Specimen: None  EBL: None  Prosthetic device: Hoya iSert 250, power 17.5 D, SN  [id number].

## 2015-03-19 NOTE — Transfer of Care (Signed)
Immediate Anesthesia Transfer of Care Note  Patient: Nicole Meadows  Procedure(s) Performed: Procedure(s): CATARACT EXTRACTION PHACO AND INTRAOCULAR LENS PLACEMENT LEFT EYE CDE=5.44 (Left)  Patient Location: Short Stay  Anesthesia Type:MAC  Level of Consciousness: awake, alert , oriented and patient cooperative  Airway & Oxygen Therapy: Patient Spontanous Breathing  Post-op Assessment: Report given to RN, Post -op Vital signs reviewed and stable and Patient moving all extremities  Post vital signs: Reviewed and stable  Last Vitals:  Filed Vitals:   03/19/15 1035  BP: 95/54  Pulse:   Temp:   Resp: 11    Complications: No apparent anesthesia complications

## 2015-03-19 NOTE — Anesthesia Preprocedure Evaluation (Signed)
Anesthesia Evaluation  Patient identified by MRN, date of birth, ID band Patient awake    Reviewed: Allergy & Precautions, NPO status , Patient's Chart, lab work & pertinent test results  Airway Mallampati: II  TM Distance: >3 FB     Dental  (+) Teeth Intact   Pulmonary  breath sounds clear to auscultation        Cardiovascular hypertension, Pt. on medications + CAD Rhythm:Regular Rate:Normal     Neuro/Psych    GI/Hepatic negative GI ROS,   Endo/Other    Renal/GU      Musculoskeletal  (+) Arthritis -,   Abdominal   Peds  Hematology   Anesthesia Other Findings   Reproductive/Obstetrics                             Anesthesia Physical Anesthesia Plan  ASA: II  Anesthesia Plan: MAC   Post-op Pain Management:    Induction: Intravenous  Airway Management Planned: Nasal Cannula  Additional Equipment:   Intra-op Plan:   Post-operative Plan:   Informed Consent: I have reviewed the patients History and Physical, chart, labs and discussed the procedure including the risks, benefits and alternatives for the proposed anesthesia with the patient or authorized representative who has indicated his/her understanding and acceptance.     Plan Discussed with:   Anesthesia Plan Comments:         Anesthesia Quick Evaluation

## 2015-03-20 ENCOUNTER — Encounter (HOSPITAL_COMMUNITY): Payer: Self-pay | Admitting: Ophthalmology

## 2015-03-25 ENCOUNTER — Emergency Department (HOSPITAL_COMMUNITY)
Admission: EM | Admit: 2015-03-25 | Discharge: 2015-03-25 | Disposition: A | Discharge status: Home / Self Care | Payer: Medicare Other | Attending: Emergency Medicine | Admitting: Emergency Medicine

## 2015-03-25 ENCOUNTER — Emergency Department (HOSPITAL_COMMUNITY): Payer: Medicare Other

## 2015-03-25 ENCOUNTER — Encounter (HOSPITAL_COMMUNITY): Payer: Self-pay

## 2015-03-25 DIAGNOSIS — M5489 Other dorsalgia: Secondary | ICD-10-CM

## 2015-03-25 DIAGNOSIS — Z7982 Long term (current) use of aspirin: Secondary | ICD-10-CM | POA: Insufficient documentation

## 2015-03-25 DIAGNOSIS — I10 Essential (primary) hypertension: Secondary | ICD-10-CM | POA: Diagnosis not present

## 2015-03-25 DIAGNOSIS — Z880 Allergy status to penicillin: Secondary | ICD-10-CM | POA: Diagnosis not present

## 2015-03-25 DIAGNOSIS — Z8742 Personal history of other diseases of the female genital tract: Secondary | ICD-10-CM | POA: Insufficient documentation

## 2015-03-25 DIAGNOSIS — Z9889 Other specified postprocedural states: Secondary | ICD-10-CM | POA: Insufficient documentation

## 2015-03-25 DIAGNOSIS — I2510 Atherosclerotic heart disease of native coronary artery without angina pectoris: Secondary | ICD-10-CM | POA: Insufficient documentation

## 2015-03-25 DIAGNOSIS — Z91040 Latex allergy status: Secondary | ICD-10-CM | POA: Insufficient documentation

## 2015-03-25 DIAGNOSIS — M1990 Unspecified osteoarthritis, unspecified site: Secondary | ICD-10-CM | POA: Insufficient documentation

## 2015-03-25 DIAGNOSIS — Z86018 Personal history of other benign neoplasm: Secondary | ICD-10-CM | POA: Insufficient documentation

## 2015-03-25 DIAGNOSIS — E785 Hyperlipidemia, unspecified: Secondary | ICD-10-CM | POA: Diagnosis not present

## 2015-03-25 DIAGNOSIS — G8929 Other chronic pain: Secondary | ICD-10-CM | POA: Insufficient documentation

## 2015-03-25 DIAGNOSIS — H409 Unspecified glaucoma: Secondary | ICD-10-CM | POA: Diagnosis not present

## 2015-03-25 DIAGNOSIS — M546 Pain in thoracic spine: Secondary | ICD-10-CM | POA: Insufficient documentation

## 2015-03-25 DIAGNOSIS — R142 Eructation: Secondary | ICD-10-CM | POA: Diagnosis not present

## 2015-03-25 DIAGNOSIS — M79604 Pain in right leg: Secondary | ICD-10-CM | POA: Diagnosis not present

## 2015-03-25 DIAGNOSIS — Z79899 Other long term (current) drug therapy: Secondary | ICD-10-CM | POA: Diagnosis not present

## 2015-03-25 DIAGNOSIS — Z862 Personal history of diseases of the blood and blood-forming organs and certain disorders involving the immune mechanism: Secondary | ICD-10-CM | POA: Insufficient documentation

## 2015-03-25 LAB — COMPREHENSIVE METABOLIC PANEL
ALT: 20 U/L (ref 14–54)
AST: 22 U/L (ref 15–41)
Albumin: 4.1 g/dL (ref 3.5–5.0)
Alkaline Phosphatase: 80 U/L (ref 38–126)
Anion gap: 4 — ABNORMAL LOW (ref 5–15)
BUN: 19 mg/dL (ref 6–20)
CO2: 31 mmol/L (ref 22–32)
Calcium: 10.7 mg/dL — ABNORMAL HIGH (ref 8.9–10.3)
Chloride: 103 mmol/L (ref 101–111)
Creatinine, Ser: 0.87 mg/dL (ref 0.44–1.00)
GFR calc Af Amer: >60 mL/min (ref >60)
GFR calc non Af Amer: >60 mL/min (ref >60)
Glucose, Bld: 108 mg/dL — ABNORMAL HIGH (ref 65–99)
Potassium: 4.9 mmol/L (ref 3.5–5.1)
Sodium: 138 mmol/L (ref 135–145)
Total Bilirubin: 0.7 mg/dL (ref 0.3–1.2)
Total Protein: 7.4 g/dL (ref 6.5–8.1)

## 2015-03-25 LAB — CBC WITH DIFFERENTIAL/PLATELET
Basophils Absolute: 0 10*3/uL (ref 0.0–0.1)
Basophils Relative: 0 % (ref 0–1)
Eosinophils Absolute: 0.1 10*3/uL (ref 0.0–0.7)
Eosinophils Relative: 1 % (ref 0–5)
HCT: 42.7 % (ref 36.0–46.0)
Hemoglobin: 14.2 g/dL (ref 12.0–15.0)
Lymphocytes Relative: 30 % (ref 12–46)
Lymphs Abs: 1.7 10*3/uL (ref 0.7–4.0)
MCH: 30 pg (ref 26.0–34.0)
MCHC: 33.3 g/dL (ref 30.0–36.0)
MCV: 90.1 fL (ref 78.0–100.0)
Monocytes Absolute: 0.4 10*3/uL (ref 0.1–1.0)
Monocytes Relative: 6 % (ref 3–12)
Neutro Abs: 3.5 10*3/uL (ref 1.7–7.7)
Neutrophils Relative %: 63 % (ref 43–77)
Platelets: 235 10*3/uL (ref 150–400)
RBC: 4.74 MIL/uL (ref 3.87–5.11)
RDW: 13.9 % (ref 11.5–15.5)
WBC: 5.6 10*3/uL (ref 4.0–10.5)

## 2015-03-25 LAB — TROPONIN I: Troponin I: <0.03 ng/mL (ref <0.031)

## 2015-03-25 MED ORDER — MORPHINE SULFATE (PF) 2 MG/ML IV SOLN
2.0000 mg | Freq: Once | INTRAVENOUS | Status: AC
  Administered 2015-03-25: 2 mg via INTRAVENOUS
  Filled 2015-03-25: qty 1

## 2015-03-25 MED ORDER — IOHEXOL 350 MG/ML SOLN
100.0000 mL | Freq: Once | INTRAVENOUS | Status: AC | PRN
  Administered 2015-03-25: 100 mL via INTRAVENOUS

## 2015-03-25 MED ORDER — OXYCODONE-ACETAMINOPHEN 5-325 MG PO TABS: 1.0000 | ORAL_TABLET | Freq: Four times a day (QID) | ORAL | Status: DC | PRN

## 2015-03-25 NOTE — ED Notes (Signed)
Family at bedside. 

## 2015-03-25 NOTE — ED Notes (Signed)
Attempt IV placement for 20g in order for pt to have CTA x2 (once in left [facility] and once in right forearm) without success.  Notified 2nd RN for 20g placement.

## 2015-03-25 NOTE — Discharge Instructions (Signed)
Percocet as prescribed as needed for pain.  Return to the emergency department if symptoms significantly worsen or change, and follow-up with your primary Dr. if not improving in the next week.   Back Pain, Adult Back pain is very common. The pain often gets better over time. The cause of back pain is usually not dangerous. Most people can learn to manage their back pain on their own.  HOME CARE   Stay active. Start with short walks on flat ground if you can. Try to walk farther each day.  Do not sit, drive, or stand in one place for more than 30 minutes. Do not stay in bed.  Do not avoid exercise or work. Activity can help your back heal faster.  Be careful when you bend or lift an object. Bend at your knees, keep the object close to you, and do not twist.  Sleep on a firm mattress. Lie on your side, and bend your knees. If you lie on your back, put a pillow under your knees.  Only take medicines as told by your doctor.  Put ice on the injured area.  Put ice in a plastic bag.  Place a towel between your skin and the bag.  Leave the ice on for 15-20 minutes, 03-04 times a day for the first 2 to 3 days. After that, you can switch between ice and heat packs.  Ask your doctor about back exercises or massage.  Avoid feeling anxious or stressed. Find good ways to deal with stress, such as exercise. GET HELP RIGHT AWAY IF:   Your pain does not go away with rest or medicine.  Your pain does not go away in 1 week.  You have new problems.  You do not feel well.  The pain spreads into your legs.  You cannot control when you poop (bowel movement) or pee (urinate).  Your arms or legs feel weak or lose feeling (numbness).  You feel sick to your stomach (nauseous) or throw up (vomit).  You have belly (abdominal) pain.  You feel like you may pass out (faint). MAKE SURE YOU:   Understand these instructions.  Will watch your condition.  Will get help right away if you are  not doing well or get worse. Document Released: 12/28/2007 Document Revised: 10/03/2011 Document Reviewed: 11/12/2013 [facility] Patient Information 2015 [place], [place]. This information is not intended to replace advice given to you by your health care provider. Make sure you discuss any questions you have with your health care provider.

## 2015-03-25 NOTE — ED Notes (Addendum)
Pt reports walked to the end of her driveway this morning and when she got back to her house, she had sudden onset of sharp pain in center of back.  Pt says the pain gets sharper when she tries to "walk it off."   Pt says the pain radiates around to epigastric area.   Denies any nausea,  sob or chest pain.  Pt also c/o pain in r leg from hip to top of r foot.   Pt reports had cataract surgery aug 8 and aug 25 here at [facility].

## 2015-03-25 NOTE — ED Notes (Signed)
MD at bedside. 

## 2015-03-25 NOTE — ED Provider Notes (Signed)
CSN: [id number]     Arrival date & time 03/25/15  1000 History  This chart was scribed for Nicole Speak, MD by Tula Nakayama, ED Scribe. This patient was seen in room APA06/[facility]06 and the patient's care was started at 10:26 AM.    Chief Complaint  Patient presents with  . Back Pain   Patient is a 74 y.o. female presenting with back pain. The history is provided by the patient. No language interpreter was used.  Back Pain Location:  Thoracic spine Quality: sharp. Radiates to:  Does not radiate Pain severity:  Moderate Onset quality:  Sudden Timing:  Constant Progression:  Unchanged Chronicity:  New Associated symptoms: no abdominal pain and no chest pain     HPI Comments: Nicole Meadows is a 74 y.o. female who presents to the Emergency Department complaining of acute onset, moderate, sharp middle back pain that started this morning. Pt states a few episodes of belching as an associated symptom. She also notes chronic right leg pain which is unchanged today. Her pain becomes worse with standing. Pt reports that onset of pain started while she was picking up the paper from the driveway. She also picked up a case of water prior to this episode. Pt denies a history of osteoporosis. She had a bone density test last year which was normal. Pt had cardiac catheterization in 2008. Pt denies SOB, nausea, pain radiating to her arm or jaw, abdominal pain, leg swelling and CP as associated symptoms.  Past Medical History  Diagnosis Date  . Tremor   . Vaginal dryness   . Fibroid   . Hypertension   . Coronary artery disease     moderate, by cath  . History of nuclear stress test 05/2010    bruce myoview; small fixed distal anteroapical, apical defect (breast attenuation or apical thinning), no reversible ischemia, post-stress EF 83%, abnormal but low risk study   . Dyslipidemia   . Anemia   . Arthritis   . Glaucoma   . Hyperlipidemia    Past Surgical History  Procedure Laterality Date  .  Abdominal hysterectomy  1995  . Tubal ligation  1977  . Transthoracic echocardiogram  2005    EF 60% with borderline conc LVH; mild TR, RVSP 56mmHg   . Cardiac catheterization  05/31/2007    no signficant CAD, low normal EF (Dr. Gerrie Nordmann)   . Breast surgery      Biopsy negative  . Cataract extraction w/phaco Right 03/02/2015    Procedure: CATARACT EXTRACTION PHACO AND INTRAOCULAR LENS PLACEMENT RIGHT EYE CDE=5.78;  Surgeon: [name], MD;  Location: [facility];  Service: Ophthalmology;  Laterality: Right;  . Cataract extraction w/phaco Left 03/19/2015    Procedure: CATARACT EXTRACTION PHACO AND INTRAOCULAR LENS PLACEMENT LEFT EYE CDE=5.44;  Surgeon: [name], MD;  Location: [facility];  Service: Ophthalmology;  Laterality: Left;   Family History  Problem Relation Age of Onset  . Cancer Sister     uterine  . Hypertension Mother   . Sudden death Mother   . Heart disease Father   . Kidney failure Father   . Heart failure Brother     DM, HTN  . Aneurysm Brother     abdominal; also HTN  . Hypertension Brother     sudden death  . Hypertension Brother   . Hyperlipidemia Brother     also DM  . Hypertension Brother   . Other Brother     2 brain surgeries, bleeding   Social  History  Substance Use Topics  . Smoking status: Never Smoker   . Smokeless tobacco: Never Used  . Alcohol Use: No   OB History    Gravida Para Term Preterm AB TAB SAB Ectopic Multiple Living   2 2             Review of Systems  Respiratory: Negative for shortness of breath.   Cardiovascular: Negative for chest pain and leg swelling.  Gastrointestinal: Negative for nausea and abdominal pain.  Musculoskeletal: Positive for back pain and arthralgias.   Allergies  Hydrocodone; Zocor; Latex; and Penicillins  Home Medications   Prior to Admission medications   Medication Sig Start Date End Date Taking? Authorizing Provider  aspirin 81 MG tablet Take 81 mg by mouth daily.    Historical Provider, MD  azelastine  (ASTELIN) 137 MCG/SPRAY nasal spray Place 1 spray into the nose 2 (two) times daily. Use in each nostril as directed    Historical Provider, MD  betamethasone dipropionate (DIPROLENE) 0.05 % cream Apply 1 application topically daily as needed (dermatitis).     Historical Provider, MD  calcium citrate-vitamin D (CITRACAL+D) 315-200 MG-UNIT per tablet Take 1 tablet by mouth 2 (two) times daily.    Historical Provider, MD  celecoxib (CELEBREX) 200 MG capsule Take 200 mg by mouth daily as needed for mild pain.    Historical Provider, MD  Clocortolone Pivalate (CLODERM) 0.1 % cream Apply 1 application topically daily as needed (for dermatitis).    Historical Provider, MD  cycloSPORINE (RESTASIS) 0.05 % ophthalmic emulsion Place 1 drop into both eyes 2 (two) times daily.     Historical Provider, MD  DUREZOL 0.05 % EMUL Place 1 drop into the right eye 3 (three) times daily. 02/10/15   Historical Provider, MD  FLAX OIL-FISH OIL-BORAGE OIL PO Take 1 capsule by mouth daily.     Historical Provider, MD  fluticasone (FLONASE) 50 MCG/ACT nasal spray Place 1 spray into both nostrils every morning.  04/25/13   Historical Provider, MD  nitroGLYCERIN (NITROLINGUAL) 0.4 MG/SPRAY spray Place 1 spray under the tongue every 5 (five) minutes x 3 doses as needed for chest pain.    Historical Provider, MD  pantoprazole (PROTONIX) 40 MG tablet Take 40 mg by mouth daily.    Historical Provider, MD  PROLENSA 0.07 % SOLN Place 1 drop into the right eye daily. 02/10/15   Historical Provider, MD  rosuvastatin (CRESTOR) 5 MG tablet Take 5 mg by mouth daily.    Historical Provider, MD  telmisartan-hydrochlorothiazide (MICARDIS HCT) 80-12.5 MG per tablet Take 1 tablet by mouth daily. 07/08/13   Pixie Casino, MD   BP 120/49 mmHg  Pulse 57  Temp(Src) 97.8 F (36.6 C) (Oral)  Resp 20  Ht 5\' 5"  (1.651 m)  Wt 176 lb (79.833 kg)  BMI 29.29 kg/m2  SpO2 100% Physical Exam  Constitutional: She is oriented to person, place, and time.  She appears well-developed and well-nourished. No distress.  HENT:  Head: Normocephalic and atraumatic.  Mouth/Throat: Oropharynx is clear and moist. No oropharyngeal exudate.  Eyes: Conjunctivae and EOM are normal. Pupils are equal, round, and reactive to light.  Neck: Normal range of motion. Neck supple.  No meningismus.  Cardiovascular: Normal rate, regular rhythm, normal heart sounds and intact distal pulses.   No murmur heard. Pulmonary/Chest: Effort normal and breath sounds normal. No respiratory distress.  Abdominal: Soft. There is no tenderness. There is no rebound and no guarding.  Musculoskeletal: Normal range of motion.  She exhibits no edema or tenderness.  Neurological: She is alert and oriented to person, place, and time. No cranial nerve deficit. She exhibits normal muscle tone. Coordination normal.  Skin: Skin is warm.  Psychiatric: She has a normal mood and affect. Her behavior is normal.  Nursing note and vitals reviewed.   ED Course  Procedures   DIAGNOSTIC STUDIES: Oxygen Saturation is 100% on RA, normal by my interpretation.    COORDINATION OF CARE: 10:34 AM Discussed treatment plan with pt which includes chest x-ray and lab work. Pt agreed to plan.  Labs Review Labs Reviewed - No data to display  Imaging Review No results found.   EKG Interpretation None      MDM   Final diagnoses:  None    Patient is a 74 year old female who presents with complaints of mid back pain that started while walking out to her mailbox this morning. She did not fall or injure herself in any way. Her neurologic exam is nonfocal and strength and reflexes are symmetrical in her legs. Her abdominal exam is benign. Patient states that her brother passed away from a ruptured aneurysm and is concerned about this possibility. She did undergo CTA of the chest abdomen and pelvis which did not show any evidence for this. She will be treated with pain medication and when necessary  return. I see nothing emergent that requires any further imaging at this point. If she is not improving in the next week, she can follow-up with her primary doctor for further evaluation.  I personally performed the services described in this documentation, which was scribed in my presence. The recorded information has been reviewed and is accurate.      Nicole Speak, MD 03/30/15 [phone]

## 2015-08-25 ENCOUNTER — Encounter: Payer: Self-pay | Admitting: Internal Medicine

## 2015-08-25 ENCOUNTER — Ambulatory Visit (INDEPENDENT_AMBULATORY_CARE_PROVIDER_SITE_OTHER): Payer: Medicare Other | Admitting: Internal Medicine

## 2015-08-25 VITALS — BP 130/68 | HR 60 | Ht 65.0 in | Wt 182.4 lb

## 2015-08-25 DIAGNOSIS — E669 Obesity, unspecified: Secondary | ICD-10-CM | POA: Insufficient documentation

## 2015-08-25 DIAGNOSIS — R251 Tremor, unspecified: Secondary | ICD-10-CM | POA: Diagnosis not present

## 2015-08-25 DIAGNOSIS — I10 Essential (primary) hypertension: Secondary | ICD-10-CM

## 2015-08-25 DIAGNOSIS — E785 Hyperlipidemia, unspecified: Secondary | ICD-10-CM | POA: Diagnosis not present

## 2015-08-25 MED ORDER — NITROGLYCERIN 0.4 MG/SPRAY TL SOLN: 1.0000 | Status: AC | PRN

## 2015-08-25 NOTE — Progress Notes (Signed)
OFFICE NOTE  Chief Complaint:  No complaints  Primary Care Physician: Elyn Peers, MD  HPI:  Nicole Meadows  is a pleasant 75 year old female with history of moderate coronary disease by cath on medical therapy, also problems with up-and-down weight and essential tremor. She has dyslipidemia which has been fairly well controlled and hypertension which is also at goal. Her only other concern is that really she's had some left breast pain. The symptom are somewhat toothache-like, and seemed to been improved with topical corticosteroids. She saw dermatologist who prescribed this medicine. She denies any worsening shortness of breath with exertion but has had several pound weight gain and knows that she needs to work on this. She's been taking care of her husband who recently had knee replacement.  I saw Nicole Meadows back today for follow-up. She denies any chest pain or worsening shortness of breath. She's had good blood pressure control. Her weight seems pretty stable. She has a essential tremor which is unchanged.  Nicole Meadows returns today for follow-up. Over the past year she's had no new complaints. She did have an episode in the summer of upper mid back pain. He was thought to be orthopedic and she went to the hospital but ruled out and underwent testing which was negative. She's had no further episodes. Blood pressure is well-controlled. Her cholesterol is at goal per primary care provider.  PMHx:  Past Medical History  Diagnosis Date  . Tremor   . Vaginal dryness   . Fibroid   . Hypertension   . Coronary artery disease     moderate, by cath  . History of nuclear stress test 05/2010    bruce myoview; small fixed distal anteroapical, apical defect (breast attenuation or apical thinning), no reversible ischemia, post-stress EF 83%, abnormal but low risk study   . Dyslipidemia   . Anemia   . Arthritis   . Glaucoma   . Hyperlipidemia     Past Surgical History  Procedure  Laterality Date  . Abdominal hysterectomy  1995  . Tubal ligation  1977  . Transthoracic echocardiogram  2005    EF 60% with borderline conc LVH; mild TR, RVSP 62mmHg   . Cardiac catheterization  05/31/2007    no signficant CAD, low normal EF (Dr. Gerrie Nordmann)   . Breast surgery      Biopsy negative  . Cataract extraction w/phaco Right 03/02/2015    Procedure: CATARACT EXTRACTION PHACO AND INTRAOCULAR LENS PLACEMENT RIGHT EYE CDE=5.78;  Surgeon: [name], MD;  Location: [facility];  Service: Ophthalmology;  Laterality: Right;  . Cataract extraction w/phaco Left 03/19/2015    Procedure: CATARACT EXTRACTION PHACO AND INTRAOCULAR LENS PLACEMENT LEFT EYE CDE=5.44;  Surgeon: [name], MD;  Location: [facility];  Service: Ophthalmology;  Laterality: Left;    FAMHx:  Family History  Problem Relation Age of Onset  . Cancer Sister     uterine  . Hypertension Mother   . Sudden death Mother   . Heart disease Father   . Kidney failure Father   . Heart failure Brother     DM, HTN  . Aneurysm Brother     abdominal; also HTN  . Hypertension Brother     sudden death  . Hypertension Brother   . Hyperlipidemia Brother     also DM  . Hypertension Brother   . Other Brother     2 brain surgeries, bleeding    SOCHx:   reports that she has never smoked.  She has never used smokeless tobacco. She reports that she does not drink alcohol or use illicit drugs.  ALLERGIES:  Allergies  Allergen Reactions  . Hydrocodone Nausea And Vomiting  . Zocor [Simvastatin]     myalgias  . Latex Rash  . Penicillins Rash    ROS: A comprehensive review of systems was negative except for: Respiratory: positive for dyspnea on exertion Neurological: positive for tremors  HOME MEDS: Current Outpatient Prescriptions  Medication Sig Dispense Refill  . aspirin 81 MG tablet Take 81 mg by mouth daily.    [name] azelastine (ASTELIN) 137 MCG/SPRAY nasal spray Place 1 spray into the nose 2 (two) times daily. Use in each nostril  as directed    . betamethasone dipropionate (DIPROLENE) 0.05 % cream Apply 1 application topically daily as needed (dermatitis).     . calcium citrate-vitamin D (CITRACAL+D) 315-200 MG-UNIT per tablet Take 1 tablet by mouth 2 (two) times daily.    . celecoxib (CELEBREX) 200 MG capsule Take 200 mg by mouth daily as needed for mild pain.    [name] Clocortolone Pivalate (CLODERM) 0.1 % cream Apply 1 application topically daily as needed (for dermatitis).    . cycloSPORINE (RESTASIS) 0.05 % ophthalmic emulsion Place 1 drop into both eyes 2 (two) times daily.     [name] FLAX OIL-FISH OIL-BORAGE OIL PO Take 1 capsule by mouth daily.     . fluticasone (FLONASE) 50 MCG/ACT nasal spray Place 1 spray into both nostrils every morning.     . nitroGLYCERIN (NITROLINGUAL) 0.4 MG/SPRAY spray Place 1 spray under the tongue every 5 (five) minutes x 3 doses as needed for chest pain.    . pantoprazole (PROTONIX) 40 MG tablet Take 40 mg by mouth daily.    . rosuvastatin (CRESTOR) 5 MG tablet Take 5 mg by mouth daily.    [name] telmisartan-hydrochlorothiazide (MICARDIS HCT) 80-12.5 MG per tablet Take 1 tablet by mouth daily. 28 tablet 0   No current facility-administered medications for this visit.    LABS/IMAGING: No results found for this or any previous visit (from the past 48 hour(s)). No results found.  VITALS: BP 130/68 mmHg  Pulse 60  Ht 5\' 5"  (1.651 m)  Wt 182 lb 6.4 oz (82.736 kg)  BMI 30.35 kg/m2  EXAM: General appearance: alert and no distress Neck: no carotid bruit and no JVD Lungs: clear to auscultation bilaterally Heart: regular rate and rhythm, S1, S2 normal, no murmur, click, rub or gallop Abdomen: soft, non-tender; bowel sounds normal; no masses,  no organomegaly Extremities: extremities normal, atraumatic, no cyanosis or edema Pulses: 2+ and symmetric Skin: Skin color, texture, turgor normal. No rashes or lesions Neurologic: Grossly normal psych: Mood, affect normal  EKG: Normal sinus rhythm at  60  ASSESSMENT: 1. Hypertension-controlled 2. Dyslipidemia-at goal 3. Essential tremor 4. Obesity  PLAN: 1.   Nicole Meadows is doing well. Her upper back pain episode this summer does not sound anginal. EKG looks normal and is nonischemic. Cholesterol is well-controlled. Her blood pressures at goal. Her tremor is fairly stable. She is working on weight loss as she and her husband both gained a little weight over the holidays. Plan to see her back annually or sooner as necessary. She did inquire about nitroglycerin, I do not feel that she necessarily needs this but wants to have some on hand.  Pixie Casino, MD, [facility] Attending Cardiologist [place] [facility] Merary Garguilo 08/25/2015, 10:51 AM

## 2015-08-25 NOTE — Patient Instructions (Signed)
Dr Hilty recommends that you schedule a follow-up appointment in 1 year. You will receive a reminder letter in the mail two months in advance. If you don't receive a letter, please call our office to schedule the follow-up appointment.  If you need a refill on your cardiac medications before your next appointment, please call your pharmacy. 

## 2016-02-14 IMAGING — CT CT HEAD W/O CM
1 series · 16 of 30 positions shown, 20 images · non-contrast
Comparison: November 05, 2010

CLINICAL DATA: Acute onset headache with nausea and vomiting

EXAM:
CT HEAD WITHOUT CONTRAST
TECHNIQUE: Contiguous axial images were obtained from the base of the skull
through the vertex without intravenous contrast. Study was obtained
within 24 hr of patient's arrival at the emergency department.

[Series 2: headseq 4.8 h37s · axial · 0.43mm/px · z∈[+1137,+1292]mm · 16 of 36 slices shown, 20 images]
[im 2/36  brain]
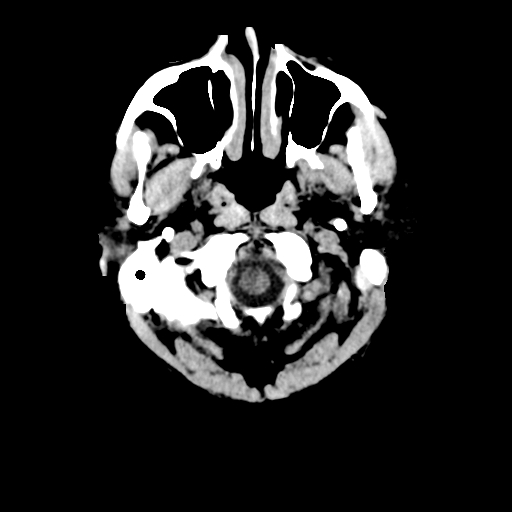
[im 2/36  bone]
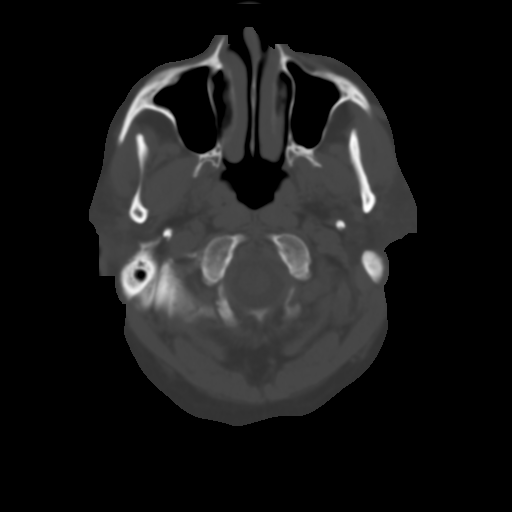
[im 4/36  brain]
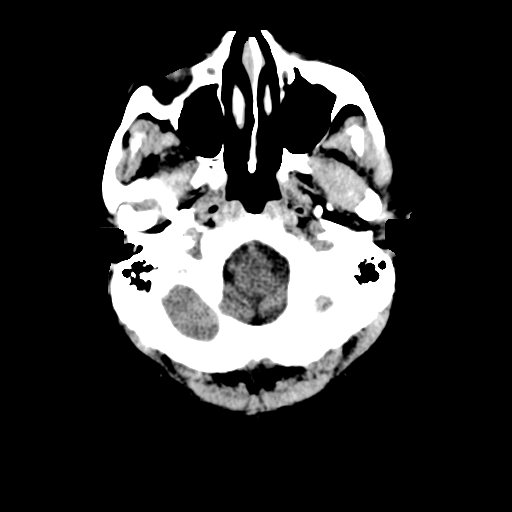
[im 7/36  brain]
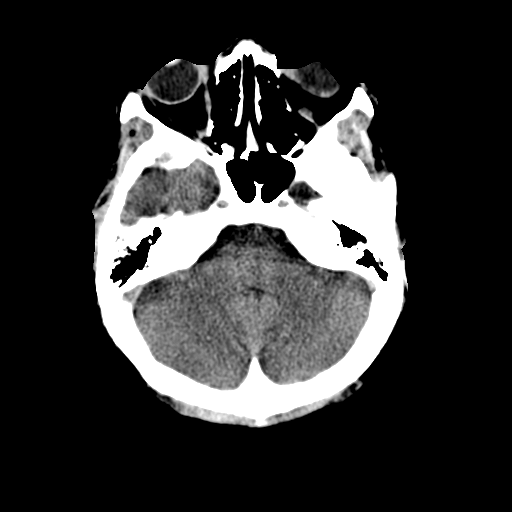
[im 9/36  brain]
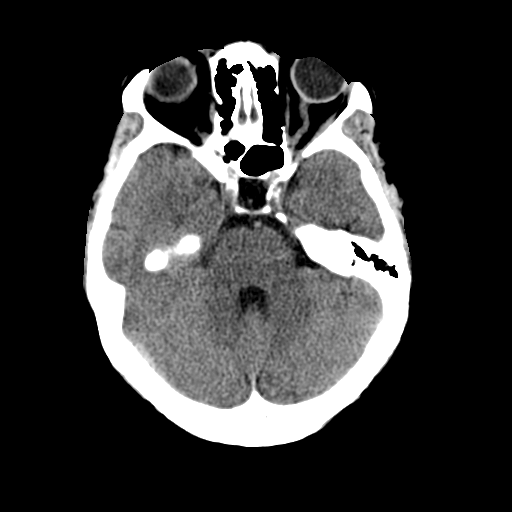
[im 10/36  brain]
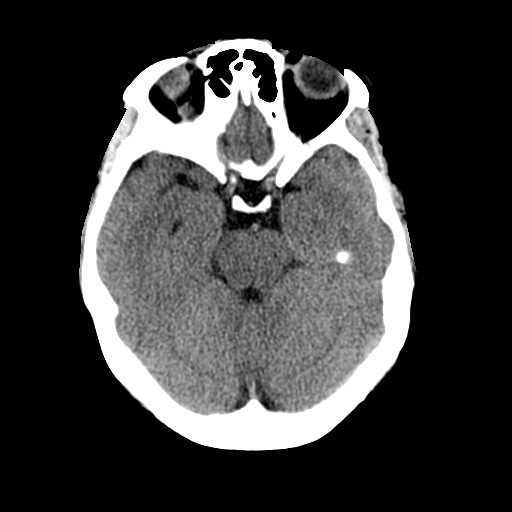
[im 10/36  bone]
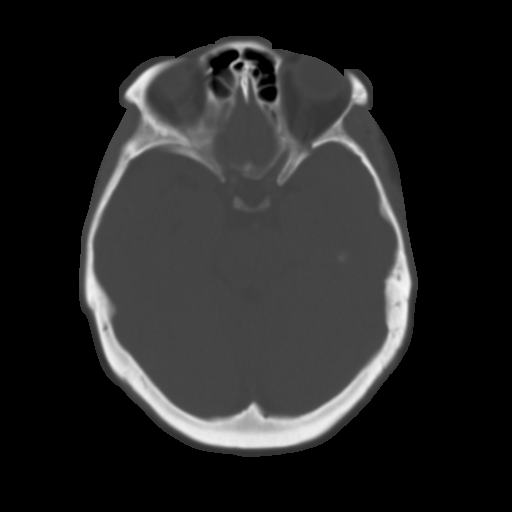
[im 13/36  brain]
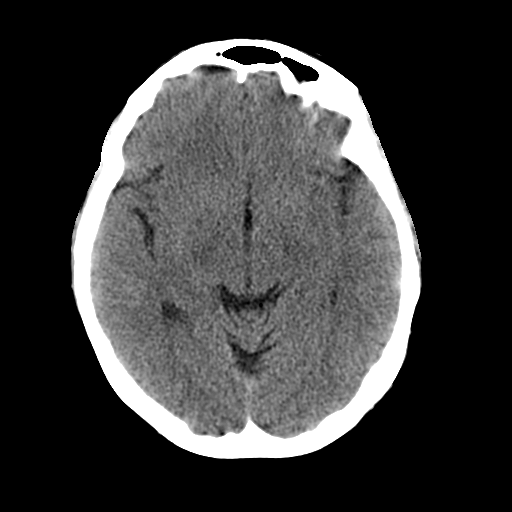
[im 15/36  brain]
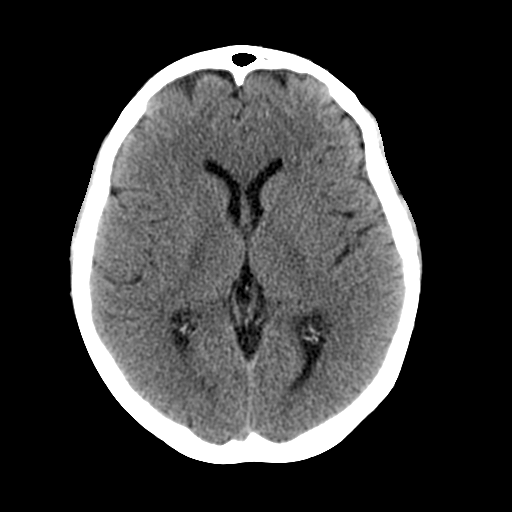
[im 17/36  brain]
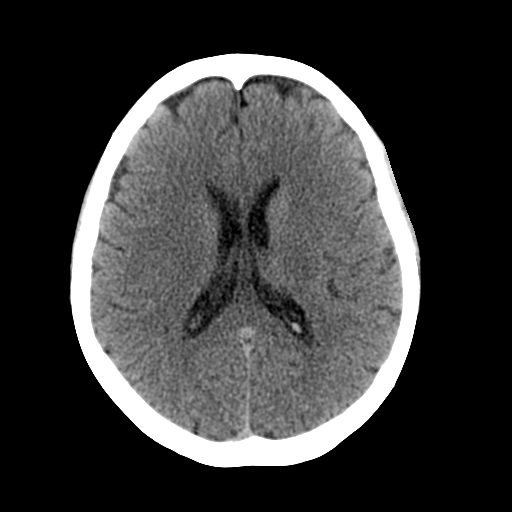
[im 19/36  brain]
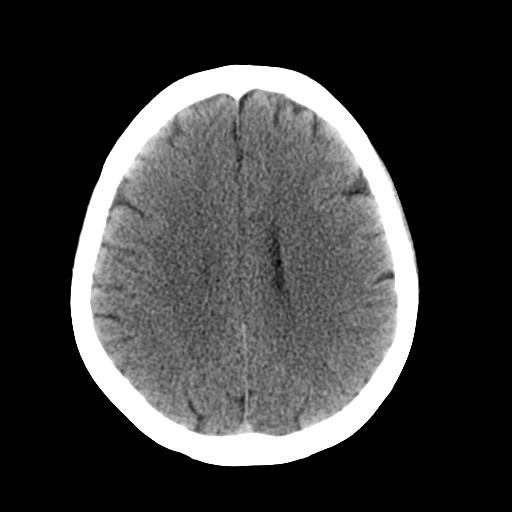
[im 19/36  bone]
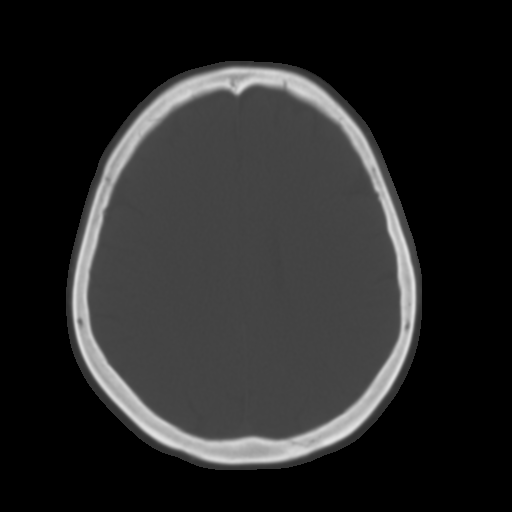
[im 21/36  brain]
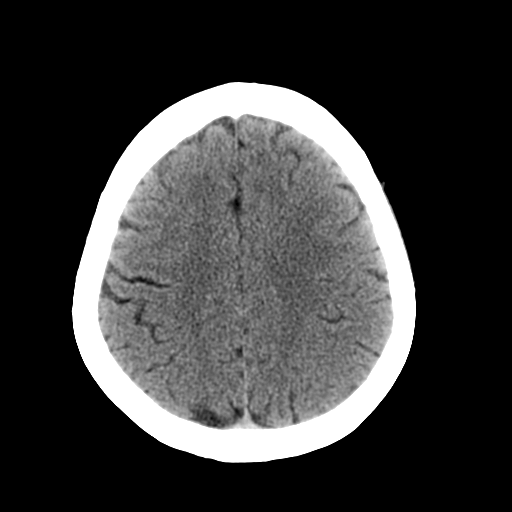
[im 23/36  brain]
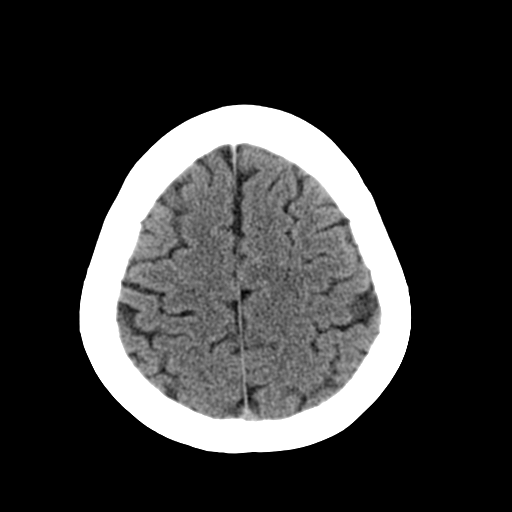
[im 26/36  brain]
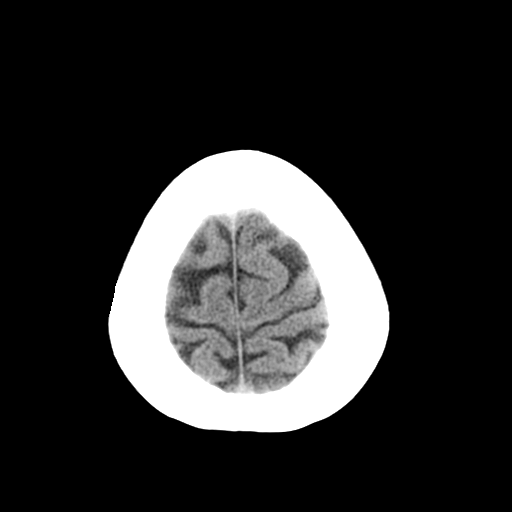
[im 27/36  brain]
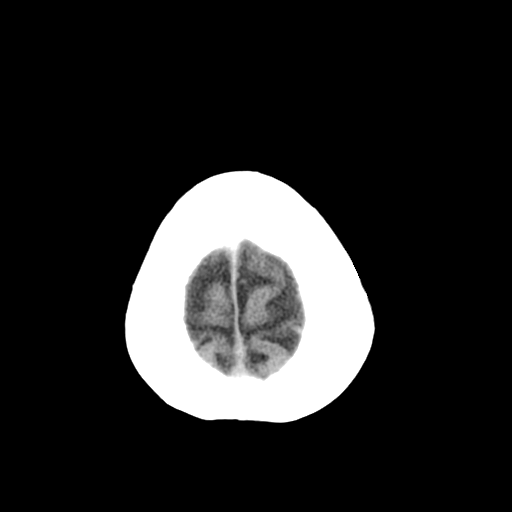
[im 27/36  bone]
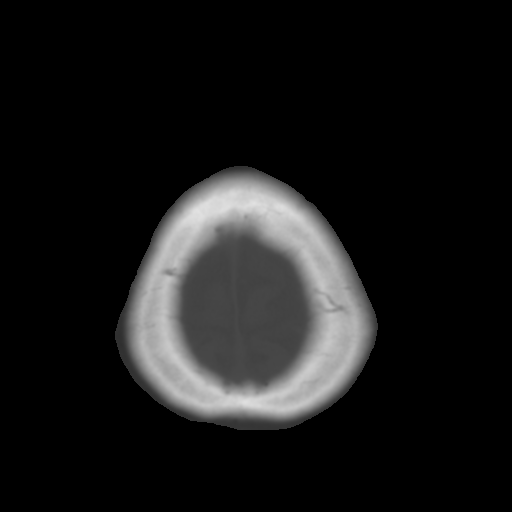
[im 29/36  brain]
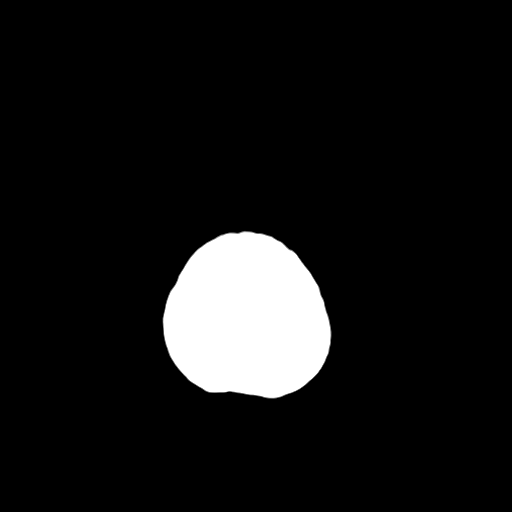
[im 32/36  brain]
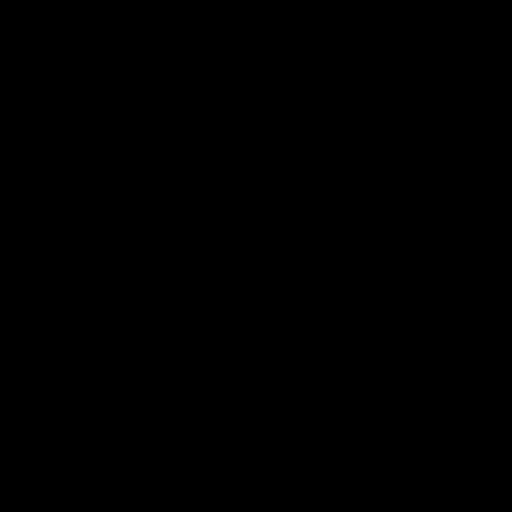
[im 34/36  brain]
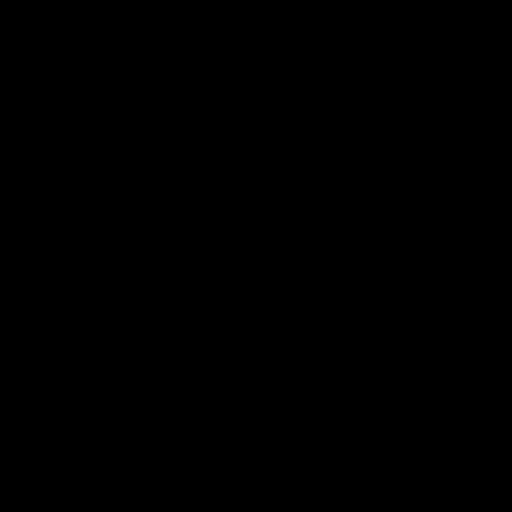

[16 of 30 positions shown; findings below may reference images not displayed]

FINDINGS: The ventricles are normal in size and configuration. There is no
mass, hemorrhage, extra-axial fluid collection, or midline shift.
Gray-white compartments appear normal. There is no demonstrable
acute infarct. Bony calvarium appears intact. The mastoid air cells
are clear. There is mild debris in both external auditory canals.
IMPRESSION: Probable cerumen in both external auditory canals. Study otherwise
unremarkable. In particular, no intracranial mass, hemorrhage, or
acute appearing infarct.

## 2016-08-29 ENCOUNTER — Encounter: Payer: Self-pay | Admitting: Internal Medicine

## 2016-08-29 ENCOUNTER — Ambulatory Visit (INDEPENDENT_AMBULATORY_CARE_PROVIDER_SITE_OTHER): Payer: Medicare Other | Admitting: Internal Medicine

## 2016-08-29 VITALS — BP 120/67 | HR 63 | Ht 66.0 in | Wt 182.8 lb

## 2016-08-29 DIAGNOSIS — E669 Obesity, unspecified: Secondary | ICD-10-CM

## 2016-08-29 DIAGNOSIS — R0600 Dyspnea, unspecified: Secondary | ICD-10-CM | POA: Diagnosis not present

## 2016-08-29 DIAGNOSIS — E782 Mixed hyperlipidemia: Secondary | ICD-10-CM

## 2016-08-29 DIAGNOSIS — I10 Essential (primary) hypertension: Secondary | ICD-10-CM

## 2016-08-29 DIAGNOSIS — I25119 Atherosclerotic heart disease of native coronary artery with unspecified angina pectoris: Secondary | ICD-10-CM | POA: Diagnosis not present

## 2016-08-29 DIAGNOSIS — I2510 Atherosclerotic heart disease of native coronary artery without angina pectoris: Secondary | ICD-10-CM | POA: Diagnosis not present

## 2016-08-29 DIAGNOSIS — R079 Chest pain, unspecified: Secondary | ICD-10-CM

## 2016-08-29 DIAGNOSIS — I2584 Coronary atherosclerosis due to calcified coronary lesion: Secondary | ICD-10-CM

## 2016-08-29 NOTE — Progress Notes (Signed)
OFFICE NOTE  Chief Complaint:  Chest pain  Primary Care Physician: Elyn Peers, MD  HPI:  Nicole Meadows  is a pleasant 76 year old female with history of moderate coronary disease by cath on medical therapy, also problems with up-and-down weight and essential tremor. She has dyslipidemia which has been fairly well controlled and hypertension which is also at goal. Her only other concern is that really she's had some left breast pain. The symptom are somewhat toothache-like, and seemed to been improved with topical corticosteroids. She saw dermatologist who prescribed this medicine. She denies any worsening shortness of breath with exertion but has had several pound weight gain and knows that she needs to work on this. She's been taking care of her husband who recently had knee replacement.  I saw Nicole Meadows back today for follow-up. She denies any chest pain or worsening shortness of breath. She's had good blood pressure control. Her weight seems pretty stable. She has a essential tremor which is unchanged.  Nicole Meadows returns today for follow-up. Over the past year she's had no new complaints. She did have an episode in the summer of upper mid back pain. He was thought to be orthopedic and she went to the hospital but ruled out and underwent testing which was negative. She's had no further episodes. Blood pressure is well-controlled. Her cholesterol is at goal per primary care provider.  08/29/2016  Nicole Meadows reports a recent episode of chest pressure. She felt tightness and burning across her chest and between her shoulder blades. She was hot. She thought it was reflux however she's been on Protonix for a long time. An EKG was performed her primary care doctor's office which showed no ischemia and she said a "heart" blood test was done, I suspect a troponin which was reportedly negative. She did have a history of moderate coronary artery disease by cardiac catheterization in the  late 2000's and had a negative nuclear stress test in November 2011. She's not had any coronary risk assessment since then. In 2016 she had CT angiography of the chest which demonstrated coronary artery calcification.  PMHx:  Past Medical History:  Diagnosis Date  . Anemia   . Arthritis   . Coronary artery disease    moderate, by cath  . Dyslipidemia   . Fibroid   . Glaucoma   . History of nuclear stress test 05/2010   bruce myoview; small fixed distal anteroapical, apical defect (breast attenuation or apical thinning), no reversible ischemia, post-stress EF 83%, abnormal but low risk study   . Hyperlipidemia   . Hypertension   . Tremor   . Vaginal dryness     Past Surgical History:  Procedure Laterality Date  . ABDOMINAL HYSTERECTOMY  1995  . BREAST SURGERY     Biopsy negative  . CARDIAC CATHETERIZATION  05/31/2007   no signficant CAD, low normal EF (Dr. Gerrie Nordmann)   . CATARACT EXTRACTION W/PHACO Right 03/02/2015   Procedure: CATARACT EXTRACTION PHACO AND INTRAOCULAR LENS PLACEMENT RIGHT EYE CDE=5.78;  Surgeon: [name], MD;  Location: [facility];  Service: Ophthalmology;  Laterality: Right;  . CATARACT EXTRACTION W/PHACO Left 03/19/2015   Procedure: CATARACT EXTRACTION PHACO AND INTRAOCULAR LENS PLACEMENT LEFT EYE CDE=5.44;  Surgeon: [name], MD;  Location: [facility];  Service: Ophthalmology;  Laterality: Left;  . TRANSTHORACIC ECHOCARDIOGRAM  2005   EF 60% with borderline conc LVH; mild TR, RVSP 35mmHg   . TUBAL LIGATION  1977    FAMHx:  Family  History  Problem Relation Age of Onset  . Cancer Sister     uterine  . Hypertension Mother   . Sudden death Mother   . Heart disease Father   . Kidney failure Father   . Heart failure Brother     DM, HTN  . Aneurysm Brother     abdominal; also HTN  . Hypertension Brother     sudden death  . Hypertension Brother   . Hyperlipidemia Brother     also DM  . Hypertension Brother   . Other Brother     2 brain surgeries, bleeding      SOCHx:   reports that she has never smoked. She has never used smokeless tobacco. She reports that she does not drink alcohol or use drugs.  ALLERGIES:  Allergies  Allergen Reactions  . Hydrocodone Nausea And Vomiting  . Zocor [Simvastatin]     myalgias  . Latex Rash  . Penicillins Rash    ROS: Pertinent items noted in HPI and remainder of comprehensive ROS otherwise negative.  HOME MEDS: Current Outpatient Prescriptions  Medication Sig Dispense Refill  . aspirin 81 MG tablet Take 81 mg by mouth daily.    [name] azelastine (ASTELIN) 137 MCG/SPRAY nasal spray Place 1 spray into the nose 2 (two) times daily. Use in each nostril as directed    . betamethasone dipropionate (DIPROLENE) 0.05 % cream Apply 1 application topically daily as needed (dermatitis).     . calcium citrate-vitamin D (CITRACAL+D) 315-200 MG-UNIT per tablet Take 1 tablet by mouth 2 (two) times daily.    . celecoxib (CELEBREX) 200 MG capsule Take 200 mg by mouth daily as needed for mild pain.    . cholecalciferol (VITAMIN D) 1000 units tablet Take 2,000 Units by mouth daily.    . cycloSPORINE (RESTASIS) 0.05 % ophthalmic emulsion Place 1 drop into both eyes 2 (two) times daily.     [name] FLAX OIL-FISH OIL-BORAGE OIL PO Take 1 capsule by mouth daily.     . fluticasone (FLONASE) 50 MCG/ACT nasal spray Place 1 spray into both nostrils every morning.     . nitroGLYCERIN (NITROLINGUAL) 0.4 MG/SPRAY spray Place 1 spray under the tongue every 5 (five) minutes x 3 doses as needed for chest pain. 12 g 2  . pantoprazole (PROTONIX) 40 MG tablet Take 40 mg by mouth daily.    [name] PREDNISONE, PAK, PO Take as directed per dose pack    . rosuvastatin (CRESTOR) 5 MG tablet Take 5 mg by mouth daily.    [name] telmisartan-hydrochlorothiazide (MICARDIS HCT) 80-12.5 MG per tablet Take 1 tablet by mouth daily. 28 tablet 0   No current facility-administered medications for this visit.     LABS/IMAGING: No results found for this or any previous  visit (from the past 48 hour(s)). No results found.  VITALS: BP 120/67 (BP Location: Right Arm)   Pulse 63   Ht 5\' 6"  (1.676 m)   Wt 182 lb 12.8 oz (82.9 kg)   BMI 29.50 kg/m   EXAM: General appearance: alert and no distress Neck: no carotid bruit and no JVD Lungs: clear to auscultation bilaterally Heart: regular rate and rhythm, S1, S2 normal, no murmur, click, rub or gallop Abdomen: soft, non-tender; bowel sounds normal; no masses,  no organomegaly Extremities: extremities normal, atraumatic, no cyanosis or edema Pulses: 2+ and symmetric Skin: Skin color, texture, turgor normal. No rashes or lesions Neurologic: Grossly normal psych: Mood, affect normal  EKG: Normal sinus rhythm at 63, LVH  by voltage criteria, no ischemic changes  ASSESSMENT: 1. Chest pain and progressive dyspnea, concerning for unstable angina 2. Hypertension-controlled 3. Dyslipidemia-at goal 4. Essential tremor 5. Obesity  PLAN: 1.   Nicole Meadows has a number of cardiac risk factors and history of moderate nonobstructive coronary disease over 10 years ago by Sharrie Rothman catheterization which is been medically managed. She recently had an episode of chest discomfort with burning and tightness across her chest and between her shoulder blades and her back. She's not had any further episodes however she has been unusually short of breath with exertion. She's not had any significant weight changes or other reasons to explain this. I suspect this could be worsening coronary disease or possibly unstable angina. I like for her to undergo a repeat Lexiscan Myoview. We'll contact her with those results and follow-up accordingly.  Pixie Casino, MD, [facility] Attending Cardiologist [place] 08/29/2016, 11:00 AM

## 2016-08-29 NOTE — Patient Instructions (Addendum)
Your physician has requested that you have a lexiscan myoview. For further information please visit [email or web address]. Please follow instruction sheet, as given.  Your physician wants you to follow-up in: ONE YEAR with Dr. Hilty. You will receive a reminder letter in the mail two months in advance. If you don't receive a letter, please call our office to schedule the follow-up appointment.  

## 2016-08-30 ENCOUNTER — Ambulatory Visit (HOSPITAL_COMMUNITY)
Admission: RE | Admit: 2016-08-30 | Discharge: 2016-08-30 | Disposition: A | Discharge status: Home / Self Care | Payer: Medicare Other | Source: Ambulatory Visit | Attending: Internal Medicine | Admitting: Internal Medicine

## 2016-08-30 DIAGNOSIS — R9439 Abnormal result of other cardiovascular function study: Secondary | ICD-10-CM | POA: Diagnosis not present

## 2016-08-30 DIAGNOSIS — I2584 Coronary atherosclerosis due to calcified coronary lesion: Secondary | ICD-10-CM | POA: Diagnosis not present

## 2016-08-30 DIAGNOSIS — R0600 Dyspnea, unspecified: Secondary | ICD-10-CM | POA: Diagnosis not present

## 2016-08-30 DIAGNOSIS — I2510 Atherosclerotic heart disease of native coronary artery without angina pectoris: Secondary | ICD-10-CM | POA: Diagnosis not present

## 2016-08-30 DIAGNOSIS — R079 Chest pain, unspecified: Secondary | ICD-10-CM | POA: Diagnosis not present

## 2016-08-30 LAB — MYOCARDIAL PERFUSION IMAGING
LV dias vol: 61 mL (ref 46–106)
LV sys vol: 22 mL
Peak HR: 86 {beats}/min
Rest HR: 55 {beats}/min
SDS: 7
SRS: 2
SSS: 9
TID: 1.5

## 2016-08-30 MED ORDER — AMINOPHYLLINE 25 MG/ML IV SOLN
75.0000 mg | Freq: Once | INTRAVENOUS | Status: AC
  Administered 2016-08-30: 75 mg via INTRAVENOUS

## 2016-08-30 MED ORDER — TECHNETIUM TC 99M TETROFOSMIN IV KIT
30.5000 | PACK | Freq: Once | INTRAVENOUS | Status: AC | PRN
  Administered 2016-08-30: 30.5 via INTRAVENOUS
  Filled 2016-08-30: qty 31

## 2016-08-30 MED ORDER — TECHNETIUM TC 99M TETROFOSMIN IV KIT
9.5000 | PACK | Freq: Once | INTRAVENOUS | Status: AC | PRN
  Administered 2016-08-30: 9.5 via INTRAVENOUS
  Filled 2016-08-30: qty 10

## 2016-08-30 MED ORDER — REGADENOSON 0.4 MG/5ML IV SOLN
0.4000 mg | Freq: Once | INTRAVENOUS | Status: AC
  Administered 2016-08-30: 0.4 mg via INTRAVENOUS

## 2016-09-14 ENCOUNTER — Ambulatory Visit (INDEPENDENT_AMBULATORY_CARE_PROVIDER_SITE_OTHER): Payer: Medicare Other | Admitting: Neurology

## 2016-09-14 ENCOUNTER — Encounter: Payer: Self-pay | Admitting: Neurology

## 2016-09-14 VITALS — BP 128/65 | HR 63 | Resp 20 | Ht 66.0 in | Wt 183.0 lb

## 2016-09-14 DIAGNOSIS — G250 Essential tremor: Secondary | ICD-10-CM | POA: Diagnosis not present

## 2016-09-14 NOTE — Patient Instructions (Signed)
Your tremor is still mild and seems to only affect your head.   Please remember, that any kind of tremor may be exacerbated by anxiety, anger, nervousness, excitement, dehydration, sleep deprivation, by caffeine, and low blood sugar values or blood sugar fluctuations or acute illness. Some medications, especially some antidepressants and lithium can cause or exacerbate tremors. Tremors may temporarily calm down or subside with the use of a benzodiazepine such as Valium or related medications and with alcohol. Be aware, however, that drinking alcohol is not an approved or appropriate treatment for tremor control and long-term use of benzodiazepines such as Valium, lorazepam, alprazolam, or clonazepam can cause habit formation, physical and psychological addiction. There are very few medications that symptomatically help with tremor reduction, none are without potential side effects.   Keep taking good care of yourself, overall you look quite well, I am pleased to see.

## 2016-09-14 NOTE — Progress Notes (Signed)
Subjective:    Patient ID: Nicole Meadows is a 76 y.o. female.  HPI     Star Age, MD, PhD [facility] [address], [address] [place], [place] [address]  Dear Dr. Criss Rosales,  I saw your patient, Nicole Meadows, upon your kind request in my neurologic clinic today for initial consultation of her tremors. The patient is unaccompanied today. As you know, [name] is a 76 year old right-handed woman with an underlying medical history of allergies, hypertension, reflux disease and obesity, who reports a long-standing history of tremors. Some 6 weeks ago she had an exacerbation of her tremor and it affected her whole body at the time. She did have joint pain at the time and also flareup in her headaches. She has a remote history of migraine headaches but they improved significantly after she had her hysterectomy in 1995. She has occasional snoring, no morning headaches.  She has previously been seen by Dr. Erling Cruz in our office for many years. I reviewed an office note by Dr. Erling Cruz from 11/30/2010, at which time he talk to her about her head tremor and headaches. He had seen her many years ago in 2005 for recurrent headaches. She reported in 2012 that her head tremor was getting worse. She declined symptomatic medication at the time. She also had symptomatic dizziness at the time. I also reviewed an office note by Dr. love from 04/11/2008, at which time he reported that she had a long-standing history of essential tremor for which he saw her in 2005. He also saw her for headaches. He also mentions a prior visit from November 1998 for vertigo and essential tremor. He reported in 2009 that she had never been on Topamax her beta blocker. He suggested trial of Inderal versus Topamax but was hesitant to suggest Inderal because of prior history of pulmonary problems. They mutually agreed to forego treatment at the time. I reviewed blood test results from your office from  08/19/2016. CMP was unremarkable with the exception of calcium borderline at 11. CBC with differential was unremarkable. ESR was normal. Total cholesterol was 139, triglycerides 129, LDL 54. TSH normal. Hemoglobin A1c was 5.7, free T4 and free T3 were normal. Vitamin D was borderline at 29. She had a brain MRI with and without contrast on 08/22/2003 as well as a brain MRA without contrast on 08/22/2003 which I reviewed: Brain MRA was normal, brain MRI was normal. She had a CTH wo contrast on 10/30/13: normal.  She had a recent nuclear stress test under Dr. Debara Pickett on 08/30/16, and I reviewed the results: Normal EF and no ischemia.  [facility], even lost weight, but d/t to her husband's health issues did not go as regularly as she did not want to leave him alone at home.  She reports a recent incident of whole body trembling and had a few other episodes since then. She has a history of arthritis and had joint pains in both ankles and both knees and was given a tapering course of prednisone for 2 weeks, which started a week after the increase in tremor.  She has since then seen a rheumatologist last week, had a number of X rays, was told she has OA. She may have had a viral ill, her rheumatologist suspected d/t to flare up of joint pain and headaches last month.  Overall, she feels that since the flareup of her tremor she has reverted back to her  baseline. She is not particularly bothered by her head tremor and does not report any hand tremor. She has a strong family history of tremors. Her father had hand tremors, she had a total of 9 siblings out of which 6 had some form of tremor including head, voice or hand tremors. She is very active, she is still able to do everything around the house, she drives without problems. She does not drink alcohol regularly, only on special occasions, she does not drink caffeine daily, coffee maybe 3 cups per week. She has 2 grown children. She lives  with her husband, son and daughter-in-law. She is retired. She has occasional forgetfulness and mostly difficulty with names but nothing sinister.  Her Past Medical History Is Significant For: Past Medical History:  Diagnosis Date  . Anemia   . Arthritis   . Coronary artery disease    moderate, by cath  . Dyslipidemia   . Fibroid   . Glaucoma   . History of nuclear stress test 05/2010   bruce myoview; small fixed distal anteroapical, apical defect (breast attenuation or apical thinning), no reversible ischemia, post-stress EF 83%, abnormal but low risk study   . Hyperlipidemia   . Hypertension   . Tremor   . Vaginal dryness     Her Past Surgical History Is Significant For: Past Surgical History:  Procedure Laterality Date  . ABDOMINAL HYSTERECTOMY  1995  . BREAST SURGERY     Biopsy negative  . CARDIAC CATHETERIZATION  05/31/2007   no signficant CAD, low normal EF (Dr. Gerrie Nordmann)   . CATARACT EXTRACTION W/PHACO Right 03/02/2015   Procedure: CATARACT EXTRACTION PHACO AND INTRAOCULAR LENS PLACEMENT RIGHT EYE CDE=5.78;  Surgeon: [name], MD;  Location: [facility];  Service: Ophthalmology;  Laterality: Right;  . CATARACT EXTRACTION W/PHACO Left 03/19/2015   Procedure: CATARACT EXTRACTION PHACO AND INTRAOCULAR LENS PLACEMENT LEFT EYE CDE=5.44;  Surgeon: [name], MD;  Location: [facility];  Service: Ophthalmology;  Laterality: Left;  . TRANSTHORACIC ECHOCARDIOGRAM  2005   EF 60% with borderline conc LVH; mild TR, RVSP 47mHg   . TUBAL LIGATION  1977    Her Family History Is Significant For: Family History  Problem Relation Age of Onset  . Cancer Sister     uterine  . Hypertension Mother   . Sudden death Mother   . Heart disease Father   . Kidney failure Father   . Heart failure Brother     DM, HTN  . Aneurysm Brother     abdominal; also HTN  . Hypertension Brother     sudden death  . Hypertension Brother   . Hyperlipidemia Brother     also DM  . Hypertension Brother   .  Other Brother     2 brain surgeries, bleeding    Her Social History Is Significant For: Social History   Social History  . Marital status: Married    Spouse name: N/A  . Number of children: N/A  . Years of education: N/A   Social History Main Topics  . Smoking status: Never Smoker  . Smokeless tobacco: Never Used  . Alcohol use No  . Drug use: No  . Sexual activity: Not Asked     Comment: Hysterectomy   Other Topics Concern  . None   Social History Narrative  . None    Her Allergies Are:  Allergies  Allergen Reactions  . Hydrocodone Nausea And Vomiting  . Zocor [Simvastatin]     myalgias  .  Latex Rash  . Penicillins Rash  :   Her Current Medications Are:  Outpatient Encounter Prescriptions as of 09/14/2016  Medication Sig  . aspirin 81 MG tablet Take 81 mg by mouth daily.  [name] azelastine (ASTELIN) 137 MCG/SPRAY nasal spray Place 1 spray into the nose 2 (two) times daily. Use in each nostril as directed  . betamethasone dipropionate (DIPROLENE) 0.05 % cream Apply 1 application topically daily as needed (dermatitis).   . calcium citrate-vitamin D (CITRACAL+D) 315-200 MG-UNIT per tablet Take 1 tablet by mouth 2 (two) times daily.  . cholecalciferol (VITAMIN D) 1000 units tablet Take 2,000 Units by mouth daily.  . cycloSPORINE (RESTASIS) 0.05 % ophthalmic emulsion Place 1 drop into both eyes 2 (two) times daily.   [name] FLAX OIL-FISH OIL-BORAGE OIL PO Take 1 capsule by mouth daily.   . fluticasone (FLONASE) 50 MCG/ACT nasal spray Place 1 spray into both nostrils every morning.   . nitroGLYCERIN (NITROLINGUAL) 0.4 MG/SPRAY spray Place 1 spray under the tongue every 5 (five) minutes x 3 doses as needed for chest pain.  . pantoprazole (PROTONIX) 40 MG tablet Take 40 mg by mouth daily.  . rosuvastatin (CRESTOR) 5 MG tablet Take 5 mg by mouth daily.  [name] telmisartan-hydrochlorothiazide (MICARDIS HCT) 80-12.5 MG per tablet Take 1 tablet by mouth daily.  . [DISCONTINUED] celecoxib  (CELEBREX) 200 MG capsule Take 200 mg by mouth daily as needed for mild pain.  . [DISCONTINUED] PREDNISONE, PAK, PO Take as directed per dose pack   No facility-administered encounter medications on file as of 09/14/2016.   :   Review of Systems:  Out of a complete 14 point review of systems, all are reviewed and negative with the exception of these symptoms as listed below:  Review of Systems  Neurological: Positive for tremors and headaches.       Pt presents today to discuss head tremors and body tremors. Pt is also having headaches.    Objective:  Neurologic Exam  Physical Exam Physical Examination:   Vitals:   09/14/16 0951  BP: 128/65  Pulse: 63  Resp: 20    General Examination: The patient is a very pleasant 76 y.o. female in no acute distress. She appears well-developed and well-nourished and very well groomed.   HEENT: Normocephalic, atraumatic, pupils are equal, round and reactive to light and accommodation. Extraocular tracking is good without limitation to gaze excursion or nystagmus noted. Normal smooth pursuit is noted. Hearing is grossly intact. Face is symmetric with normal facial animation and normal facial sensation. Speech is clear with no dysarthria noted. There is no hypophonia. ThereIs a mild head tremor, she has a minimal voice tremor. Neck is supple with full range of passive and active motion. There are no carotid bruits on auscultation. Oropharynx exam reveals: mild mouth dryness, adequate dental hygiene. Tongue protrudes centrally and palate elevates symmetrically.   Chest: Clear to auscultation without wheezing, rhonchi or crackles noted.  Heart: S1+S2+0, regular and normal without murmurs, rubs or gallops noted.   Abdomen: Soft, non-tender and non-distended with normal bowel sounds appreciated on auscultation.  Extremities: There is no pitting edema in the distal lower extremities bilaterally. Pedal pulses are intact.  Skin: Warm and dry without  trophic changes noted.  Musculoskeletal: exam reveals no obvious joint deformities, tenderness or joint swelling or erythema.   Neurologically:  Mental status: The patient is awake, alert and oriented in all 4 spheres. Her immediate and remote memory, attention, language skills and fund of knowledge are  appropriate. There is no evidence of aphasia, agnosia, apraxia or anomia. Speech is clear with normal prosody and enunciation. Thought process is linear. Mood is normal and affect is normal.  Cranial nerves II - XII are as described above under HEENT exam. In addition: shoulder shrug is normal with equal shoulder height noted. Motor exam: Normal bulk, strength and tone is noted. There is no drift, resting tremor or rebound. Romberg is negative. Her handwriting is only minimally tremulous, not micrographic, very legible. On Archimedes spiral drawing she has no difficulty with her dominant hand which is the right and minimal trembling noted on the left. She has no significant postural or action tremor. Reflexes are 2+ throughout. Fine motor skills and coordination: intact with normal finger taps, normal hand movements, normal rapid alternating patting, normal foot taps and normal foot agility.  Cerebellar testing: No dysmetria or intention tremor on finger to nose testing. Heel to shin is unremarkable bilaterally. There is no truncal or gait ataxia.  Sensory exam: intact to light touch, pinprick, vibration, temperature sense in the upper and lower extremities.  Gait, station and balance: She stands easily. No veering to one side is noted. No leaning to one side is noted. Posture is age-appropriate and stance is narrow based. Gait shows normal stride length and normal pace. No problems turning are noted. Tandem walk is unremarkable.  balance is well preserved.                Assessment and Plan:  In summary, Nicole Meadows is a very pleasant 76 y.o.-year old female with an underlying medical history of  allergies, hypertension, reflux disease and obesity, who presents for evaluation of her tremors. Her history and physical exam are in keeping with essential tremor. This affects and her case really only her head and neck area. She has no significant appendicular tremor at this time. She did have a flareup in her tremors last month, also had some other symptoms at the time including mild headache, and increasing joint pain, may have had an acute viral illness at the time which since then subsided and tremors have reverted back to baseline. She's not particularly affected by her tremor. She has a strong family history of it as well. She has no signs or symptoms of parkinsonism and was reassured in that regard. We reviewed her previous test results including head CT and brain MRI and MRA results. She also had a recent nuclear stress test which showed benign findings which is reassuring. We talked about potential symptomatically treatment options. We mutually agreed that we could continue to monitor her clinically. She is advised that tremors can get worse in certain situations including stress, dehydration, lack of sleep, anxiety, and acute illness. Overall, she is doing quite well at this time. I suggested clinical monitoring, I would like to see her back in a year, sooner as needed. I answered all her questions today and she was in agreement.  Thank you very much for allowing me to participate in the care of this nice patient. If I can be of any further assistance to you please do not hesitate to call me at [phone].  Sincerely,   Star Age, MD, PhD

## 2017-07-10 ENCOUNTER — Other Ambulatory Visit (HOSPITAL_COMMUNITY): Payer: Self-pay | Admitting: Family Medicine

## 2017-07-10 ENCOUNTER — Ambulatory Visit (HOSPITAL_COMMUNITY)
Admission: RE | Admit: 2017-07-10 | Discharge: 2017-07-10 | Disposition: A | Discharge status: Home / Self Care | Payer: Medicare Other | Source: Ambulatory Visit | Attending: Family Medicine | Admitting: Family Medicine

## 2017-07-10 DIAGNOSIS — M5136 Other intervertebral disc degeneration, lumbar region: Secondary | ICD-10-CM | POA: Diagnosis not present

## 2017-07-10 DIAGNOSIS — M130 Polyarthritis, unspecified: Secondary | ICD-10-CM

## 2017-08-31 ENCOUNTER — Other Ambulatory Visit: Payer: Self-pay

## 2017-08-31 ENCOUNTER — Encounter (HOSPITAL_COMMUNITY): Payer: Self-pay

## 2017-08-31 ENCOUNTER — Ambulatory Visit (HOSPITAL_COMMUNITY): Payer: Medicare Other | Attending: Family Medicine

## 2017-08-31 DIAGNOSIS — M6281 Muscle weakness (generalized): Secondary | ICD-10-CM

## 2017-08-31 DIAGNOSIS — M533 Sacrococcygeal disorders, not elsewhere classified: Secondary | ICD-10-CM | POA: Diagnosis present

## 2017-08-31 DIAGNOSIS — G8929 Other chronic pain: Secondary | ICD-10-CM | POA: Diagnosis present

## 2017-08-31 DIAGNOSIS — M545 Low back pain: Secondary | ICD-10-CM | POA: Insufficient documentation

## 2017-08-31 NOTE — Patient Instructions (Signed)
   BRIDGING: 1-2 sets of 10-15 repetitions for 3 second holds  While lying on your back with knees bent, tighten your lower abdominals, squeeze your buttocks and then raise your buttocks off the floor/bed as creating a "Bridge" with your body. Hold and then lower yourself and repeat.     POSTERIOR PELVIC TILT: 1-2 sets of 10-15 repetitions for 3 second holds  Lie on your back on a firm surface with knees comfortably bent (top picture).  Then flatten back against the table while contracting abdominal muscles as if pulling belly button toward ribs (bottom picture).

## 2017-08-31 NOTE — Therapy (Signed)
[facility] [place], [place], [address] Phone: [phone]   Fax:  [phone]  Physical Therapy Evaluation  Patient Details  Name: Nicole Meadows MRN: [id number] Date of Birth: 1941-02-09 Referring Provider: Lucianne Lei, MD   Encounter Date: 08/31/2017  PT End of Session - 08/31/17 1119    Visit Number  1    Number of Visits  13    Date for PT Re-Evaluation  09/21/17    Authorization Type  [employer] Medicare    Authorization Time Period  08/31/17 - 10/12/17    PT Start Time  1117    PT Stop Time  1206    PT Time Calculation (min)  49 min    Activity Tolerance  Patient tolerated treatment well    Behavior During Therapy  [facility] for tasks assessed/performed       Past Medical History:  Diagnosis Date  . Anemia   . Arthritis   . Coronary artery disease    moderate, by cath  . Dyslipidemia   . Fibroid   . Glaucoma   . History of nuclear stress test 05/2010   bruce myoview; small fixed distal anteroapical, apical defect (breast attenuation or apical thinning), no reversible ischemia, post-stress EF 83%, abnormal but low risk study   . Hyperlipidemia   . Hypertension   . Tremor   . Vaginal dryness     Past Surgical History:  Procedure Laterality Date  . ABDOMINAL HYSTERECTOMY  1995  . BREAST SURGERY     Biopsy negative  . CARDIAC CATHETERIZATION  05/31/2007   no signficant CAD, low normal EF (Dr. Gerrie Nordmann)   . CATARACT EXTRACTION W/PHACO Right 03/02/2015   Procedure: CATARACT EXTRACTION PHACO AND INTRAOCULAR LENS PLACEMENT RIGHT EYE CDE=5.78;  Surgeon: [name], MD;  Location: [facility];  Service: Ophthalmology;  Laterality: Right;  . CATARACT EXTRACTION W/PHACO Left 03/19/2015   Procedure: CATARACT EXTRACTION PHACO AND INTRAOCULAR LENS PLACEMENT LEFT EYE CDE=5.44;  Surgeon: [name], MD;  Location: [facility];  Service: Ophthalmology;  Laterality: Left;  . TRANSTHORACIC ECHOCARDIOGRAM  2005   EF 60% with borderline conc LVH;  mild TR, RVSP 60mmHg   . TUBAL LIGATION  1977    There were no vitals filed for this visit.   Subjective Assessment - 08/31/17 1125    Subjective  Patient reports she has had arthritis in her back since about 2004 and her pain has been coming and going for almost a year. She states her doctor first put her on Celebrex and her back pain got better for a little but gradually got worse again. She states she stopped the Celebrex and has been on prednisone for about a month which seemed to help and her doctor increased her dose to 2.5 mg per day for another 4 weeks because she believes the patient is experiencing inflammation. She states she has a follow-up visit this month around the 21st. She reports she has had PT for her back pain before and that the exercises really helped but she has stopped performing them regularly in the past couple years. She states now she is having trouble going to sleep but typically can sleep through the night once she falls asleep. She also reports she had a very difficult pregnancy in 1967 that resulted in severe SIJ problems after child birth. She was in therapy for about 3 months and needed a walker to ambulate at that time. She likes to be active and used to  participate in the silver sneakers program at the [facility] with her husband and would like to get back to that.    Pertinent History  No surgeries; had very difficult child birth in 1967 resulting in SIJ dysfunction    Limitations  Sitting;Standing;Lifting;Walking    How long can you sit comfortably?  60 minutes    How long can you stand comfortably?  20 minutes    How long can you walk comfortably?  can usually walk about a mile, walking up a hilll bothered it recently    Diagnostic tests  X-Ray - in January - arthritis    Currently in Pain?  Yes    Pain Score  5     Pain Location  Back    Pain Orientation  Lower    Pain Descriptors / Indicators  Nagging    Pain Type  Chronic pain    Pain Radiating Towards   travels down posterolateral right hip/leg, sharp sensation happens when first standing up or moving (transitional movement)    Pain Onset  More than a month ago    Pain Frequency  Intermittent    Aggravating Factors   sitting or standing too long         [facility] PT Assessment - 08/31/17 0001      Assessment   Medical Diagnosis  Low Back Pain    Referring Provider  Lucianne Lei, MD    Onset Date/Surgical Date  -- 12 years ago or more    Next MD Visit  Appointment at the end of February    Prior Therapy  yes, years ago for her back after surgeries      Precautions   Precautions  None      Restrictions   Weight Bearing Restrictions  No      [place] residence    Living Arrangements  Spouse/significant other;Children son and wife in basement apartment    Available Help at Discharge  Family    Type of [place] to enter    Entrance Stairs-Number of Steps  4    Entrance Stairs-Rails  Right going up    [place]  One level;Laundry or work area in 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    Leisure  walk with husband, puzzles, read, silver sneakers at [employer], was going 3x week but not right now      Cognition   Overall Cognitive Status  Within Functional Limits for tasks assessed      Observation/Other Assessments   Focus on Therapeutic Outcomes (FOTO)   will perform next session      Posture/Postural Control   Posture/Postural Control  Postural limitations    Postural Limitations  Rounded Shoulders;Forward head      AROM   Overall AROM Comments  Right flexion quadrant and left extension quadrant were irritable    Lumbar Flexion  50% limited    Lumbar Extension  WNL painful along right low back    Lumbar - Right Side Bend  WNL painful    Lumbar - Left Side Bend  WNL painful    Lumbar - Right  Rotation  WNL    Lumbar - Left Rotation  25% limited      Strength  Right Hip Flexion  4-/5    Right Hip Extension  4/5    Right Hip ABduction  4/5    Left Hip Flexion  4/5    Left Hip Extension  4/5    Left Hip ABduction  4+/5    Right Knee Flexion  5/5    Right Knee Extension  5/5    Left Knee Flexion  5/5    Left Knee Extension  5/5    Right Ankle Dorsiflexion  5/5    Left Ankle Dorsiflexion  5/5      Flexibility   Soft Tissue Assessment /Muscle Length  yes    Hamstrings  Bil limited: 90* at hip, 130 for knee extension      Special Tests    Special Tests  Lumbar;Sacrolliac Tests    Lumbar Tests  Slump Test;Straight Leg Raise    Sacroiliac Tests   Pelvic Distraction      Slump test   Findings  Positive    Comment  inconcistant bilaterally; however indicates some neural tension      Straight Leg Raise   Findings  Positive    Comment  bilaterally      Pelvic Dictraction   Findings  Positive    Side   -- bilateral    Comment  Right>Left      Pelvic Compression   Findings  Positive    Side  -- bilateral    comment  Right>Left      Sacral thrust    Findings  Positive    Side  -- bilateral    Comments  Right>Left      Gaenslen's test   Findings  Positive    Side   -- bilateral    Comments  Right>Left      Sacral Compression   Findings  Positive      Ambulation/Gait   Ambulation/Gait  Yes    Ambulation Distance (Feet)  688 Feet    Gait Pattern  Step-through pattern;Trunk flexed;Trendelenburg    Ambulation Surface  Level    Gait velocity  1.16 m/s    Stairs  Yes    Stairs Assistance  7: Independent    Stair Management Technique  One rail Right;Step to pattern;Alternating pattern    Number of Stairs  8    Height of Stairs  6    Gait Comments  step over step to ascend stairs, step to pattern to descend       Objective measurements completed on examination: See above findings.    [facility] Adult PT Treatment/Exercise - 08/31/17 0001      Exercises    Exercises  Lumbar      Lumbar Exercises: Supine   Pelvic Tilt  10 reps;5 seconds    Pelvic Tilt Limitations  cue to flatten back against table    Bridge  10 reps;5 seconds             PT Education - 08/31/17 1516    Education provided  Yes    Education Details  Educated on exam findings and discussed appropriate POC. Initiated HEP and educated on form/exercise.    Person(s) Educated  Patient    Methods  Explanation;Handout    Comprehension  Verbalized understanding       PT Short Term Goals - 09/01/17 1525      PT SHORT TERM GOAL #1   Title  Patient will be independent with HEP to improve functional strength for improved mobility    Time  3    Period  Weeks    Status  New    Target Date  09/21/17      PT SHORT TERM GOAL #2   Title  Patient will increase MMT for limited group by 1/2 grade to demonstrate improved functional strength to improve gait and stair ambulation    Time  3    Period  Weeks    Status  New      PT SHORT TERM GOAL #3   Title  Patient will improve lumbar ROM by 25% for limited planes and have no pain in right flexion quadrant and left extension quadrant to improve tolerance to funcitonal mobility and transitional movements    Time  3    Period  Weeks    Status  New        PT Long Term Goals - 09/01/17 1528      PT LONG TERM GOAL #1   Title  Patient will increase MMT for limited group by 1 grade to demonstrate improved functional strength to improve gait and stair ambulation    Time  6    Period  Weeks    Status  New    Target Date  10/12/17      PT LONG TERM GOAL #2   Title  Patint will demonstrate increased abdominal muscle strength of 5/5 MMT indicating improved trunk stability to allow patient to perform transitional movements, and to return to gardening and walking for longer durations without pain.    Time  6    Period  Weeks    Status  New      PT LONG TERM GOAL #3   Title  Patient will ambulate at 1.2 m/s to demonstrate safe  community ambulation; she will ascend/descend stairs with step over step pattern and no hand rail to demonstrate improved functional strength.    Time  6    Period  Weeks    Status  New      PT LONG TERM GOAL #4   Title  Patient will return to participating in silver sneakers or walking program 3x/week or more to return to PLOF and demosntrate improved activity tolerance.    Time  6    Period  Weeks    Status  New         Plan - 08/31/17 1632    Clinical Impression Statement  Ms. Tomkinson presents to initial PT evaluation for her low back pain. She has a significant history of back pain since [birth year] after a difficlut child birth and increaseing low back pain since 2004. She currently is limited by lower extremity weakness, decreased balance, decreased activity tolerance, decreased ROM, and pain with functional mobility. Objective tests are positive for SIJ dysfunction and core weakness. She will benefit from skilled PT intervnetions to address current impairments and progress towards goals to reduce pain and improve QOL.    History and Personal Factors relevant to plan of care:  1967 difficult child birth resulting in SIJ dysfunction and need for PT    Clinical Presentation  Stable    Clinical Decision Making  Low    Rehab Potential  Good    PT Frequency  2x / week    PT Duration  6 weeks    PT Treatment/Interventions  ADLs/Self Care Home Management;Cryotherapy;Electrical Stimulation;Moist Heat;Gait training;Stair training;Functional mobility training;Therapeutic activities;Balance training;Neuromuscular re-education;Manual techniques;Passive range of motion;Dry needling;Taping;Energy conservation    PT Next Visit Plan  Reviwe Eval and goals. Initiate core and  proximal hip strengthening.    PT Home Exercise Plan  Eval: bridge, posterior pelvic tilt    Consulted and Agree with Plan of Care  Patient       Patient will benefit from skilled therapeutic intervention in order to improve the  following deficits and impairments:  Abnormal gait, Increased fascial restricitons, Impaired sensation, Pain, Improper body mechanics, Postural dysfunction, Increased muscle spasms, Decreased mobility, Decreased activity tolerance, Decreased endurance, Decreased range of motion, Decreased strength, Hypomobility, Decreased balance, Difficulty walking, Impaired flexibility  Visit Diagnosis: Chronic bilateral low back pain, with sciatica presence unspecified  Sacroiliac joint pain  Muscle weakness (generalized)     Problem List Patient Active Problem List   Diagnosis Date Noted  . Coronary artery calcification 08/29/2016  . Dyspnea 08/29/2016  . Mixed hyperlipidemia 08/29/2016  . Coronary artery disease involving native coronary artery of native heart with angina pectoris ([place]) 08/29/2016  . Obesity (BMI 30-39.9) 08/25/2015  . HTN (hypertension) 07/08/2013  . Dyslipidemia 07/08/2013  . Tremor     Kipp Brood, PT, DPT Physical Therapist with [facility]  09/01/2017 3:43 PM    [facility] [address] [place], [place], [address] Phone: [phone]   Fax:  [phone]  Name: MADDIX KLIEWER MRN: [id number] Date of Birth: October 02, 1940

## 2017-09-04 ENCOUNTER — Ambulatory Visit: Payer: Medicare Other | Admitting: Internal Medicine

## 2017-09-04 ENCOUNTER — Telehealth (HOSPITAL_COMMUNITY): Payer: Self-pay | Admitting: Physical Therapy

## 2017-09-04 ENCOUNTER — Encounter: Payer: Self-pay | Admitting: Internal Medicine

## 2017-09-04 VITALS — BP 117/67 | HR 75 | Wt 186.0 lb

## 2017-09-04 DIAGNOSIS — I10 Essential (primary) hypertension: Secondary | ICD-10-CM | POA: Diagnosis not present

## 2017-09-04 DIAGNOSIS — I2584 Coronary atherosclerosis due to calcified coronary lesion: Secondary | ICD-10-CM | POA: Diagnosis not present

## 2017-09-04 DIAGNOSIS — I2510 Atherosclerotic heart disease of native coronary artery without angina pectoris: Secondary | ICD-10-CM | POA: Diagnosis not present

## 2017-09-04 DIAGNOSIS — E785 Hyperlipidemia, unspecified: Secondary | ICD-10-CM | POA: Diagnosis not present

## 2017-09-04 NOTE — Patient Instructions (Signed)
Your physician recommends that you return for lab work FASTING to check cholesterol   Your physician wants you to follow-up in: ONE YEAR with Dr. Hilty. You will receive a reminder letter in the mail two months in advance. If you don't receive a letter, please call our office to schedule the follow-up appointment.   

## 2017-09-04 NOTE — Telephone Encounter (Signed)
Pt is sick and cannot come tomorrow

## 2017-09-05 ENCOUNTER — Telehealth (HOSPITAL_COMMUNITY): Payer: Self-pay | Admitting: Physical Therapy

## 2017-09-05 ENCOUNTER — Ambulatory Visit (HOSPITAL_COMMUNITY): Payer: Medicare Other | Admitting: Physical Therapy

## 2017-09-05 NOTE — Telephone Encounter (Signed)
She has the flu and can not come in on 09/07/17

## 2017-09-06 ENCOUNTER — Encounter: Payer: Self-pay | Admitting: Internal Medicine

## 2017-09-06 NOTE — Progress Notes (Signed)
OFFICE NOTE  Chief Complaint:  No complaints  Primary Care Physician: Lucianne Lei, MD  HPI:  Nicole Meadows  is a pleasant 77 year old female with history of moderate coronary disease by cath on medical therapy, also problems with up-and-down weight and essential tremor. She has dyslipidemia which has been fairly well controlled and hypertension which is also at goal. Her only other concern is that really she's had some left breast pain. The symptom are somewhat toothache-like, and seemed to been improved with topical corticosteroids. She saw dermatologist who prescribed this medicine. She denies any worsening shortness of breath with exertion but has had several pound weight gain and knows that she needs to work on this. She's been taking care of her husband who recently had knee replacement.  I saw Nicole Meadows back today for follow-up. She denies any chest pain or worsening shortness of breath. She's had good blood pressure control. Her weight seems pretty stable. She has a essential tremor which is unchanged.  Nicole Meadows returns today for follow-up. Over the past year she's had no new complaints. She did have an episode in the summer of upper mid back pain. He was thought to be orthopedic and she went to the hospital but ruled out and underwent testing which was negative. She's had no further episodes. Blood pressure is well-controlled. Her cholesterol is at goal per primary care provider.  08/29/2016  Nicole Meadows reports a recent episode of chest pressure. She felt tightness and burning across her chest and between her shoulder blades. She was hot. She thought it was reflux however she's been on Protonix for a long time. An EKG was performed her primary care doctor's office which showed no ischemia and she said a "heart" blood test was done, I suspect a troponin which was reportedly negative. She did have a history of moderate coronary artery disease by cardiac catheterization in the  late 2000's and had a negative nuclear stress test in November 2011. She's not had any coronary risk assessment since then. In 2016 she had CT angiography of the chest which demonstrated coronary artery calcification.  09/06/2017  Mrs. Reach returns today for follow-up.  Overall she seems to be doing well.  She has been working with rehabilitation and Bobette Mo in order to get stronger.  She denies any chest pain or new reflux symptoms.  EKG shows normal sinus rhythm today.  Her blood pressure is well controlled.  PMHx:  Past Medical History:  Diagnosis Date  . Anemia   . Arthritis   . Coronary artery disease    moderate, by cath  . Dyslipidemia   . Fibroid   . Glaucoma   . History of nuclear stress test 05/2010   Nicole Meadows; small fixed distal anteroapical, apical defect (breast attenuation or apical thinning), no reversible ischemia, post-stress EF 83%, abnormal but low risk study   . Hyperlipidemia   . Hypertension   . Tremor   . Vaginal dryness     Past Surgical History:  Procedure Laterality Date  . ABDOMINAL HYSTERECTOMY  1995  . BREAST SURGERY     Biopsy negative  . CARDIAC CATHETERIZATION  05/31/2007   no signficant CAD, low normal EF (Dr. Gerrie Nordmann)   . CATARACT EXTRACTION W/PHACO Right 03/02/2015   Procedure: CATARACT EXTRACTION PHACO AND INTRAOCULAR LENS PLACEMENT RIGHT EYE CDE=5.78;  Surgeon: [name], MD;  Location: [facility];  Service: Ophthalmology;  Laterality: Right;  . CATARACT EXTRACTION W/PHACO Left 03/19/2015   Procedure:  CATARACT EXTRACTION PHACO AND INTRAOCULAR LENS PLACEMENT LEFT EYE CDE=5.44;  Surgeon: [name], MD;  Location: [facility];  Service: Ophthalmology;  Laterality: Left;  . TRANSTHORACIC ECHOCARDIOGRAM  2005   EF 60% with borderline conc LVH; mild TR, RVSP 72mmHg   . TUBAL LIGATION  1977    FAMHx:  Family History  Problem Relation Age of Onset  . Hypertension Mother   . Sudden death Mother   . Heart disease Father   . Kidney failure Father    . Cancer Sister        uterine  . Heart failure Brother        DM, HTN  . Aneurysm Brother        abdominal; also HTN  . Hypertension Brother        sudden death  . Hypertension Brother   . Hyperlipidemia Brother        also DM  . Hypertension Brother   . Other Brother        2 brain surgeries, bleeding    SOCHx:   reports that  has never smoked. she has never used smokeless tobacco. She reports that she does not drink alcohol or use drugs.  ALLERGIES:  Allergies  Allergen Reactions  . Hydrocodone Nausea And Vomiting  . Zocor [Simvastatin]     myalgias  . Latex Rash  . Penicillins Rash    ROS: Pertinent items noted in HPI and remainder of comprehensive ROS otherwise negative.  HOME MEDS: Current Outpatient Medications  Medication Sig Dispense Refill  . aspirin 81 MG tablet Take 81 mg by mouth daily.    [name] azelastine (ASTELIN) 137 MCG/SPRAY nasal spray Place 1 spray into the nose 2 (two) times daily. Use in each nostril as directed    . betamethasone dipropionate (DIPROLENE) 0.05 % cream Apply 1 application topically daily as needed (dermatitis).     . calcium citrate-vitamin D (CITRACAL+D) 315-200 MG-UNIT per tablet Take 1 tablet by mouth 2 (two) times daily.    . cholecalciferol (VITAMIN D) 1000 units tablet Take 2,000 Units by mouth daily.    . cycloSPORINE (RESTASIS) 0.05 % ophthalmic emulsion Place 1 drop into both eyes 2 (two) times daily.     [name] desonide (DESOWEN) 0.05 % cream Apply topically 2 (two) times daily.    [name] dexlansoprazole (DEXILANT) 60 MG capsule Take 60 mg by mouth daily.    [name] FLAX OIL-FISH OIL-BORAGE OIL PO Take 1 capsule by mouth daily.     . fluticasone (FLONASE) 50 MCG/ACT nasal spray Place 1 spray into both nostrils every morning.     . nitroGLYCERIN (NITROLINGUAL) 0.4 MG/SPRAY spray Place 1 spray under the tongue every 5 (five) minutes x 3 doses as needed for chest pain. 12 g 2  . predniSONE (DELTASONE) 2.5 MG tablet TAKE 1 TABLET EVERY DAY FOR  ARTHRITIS  1  . rosuvastatin (CRESTOR) 5 MG tablet Take 5 mg by mouth daily.    [name] telmisartan-hydrochlorothiazide (MICARDIS HCT) 80-12.5 MG per tablet Take 1 tablet by mouth daily. 28 tablet 0   No current facility-administered medications for this visit.     LABS/IMAGING: No results found for this or any previous visit (from the past 48 hour(s)). No results found.  VITALS: BP 117/67   Pulse 75   Wt 186 lb (84.4 kg)   BMI 30.02 kg/m   EXAM: General appearance: alert and no distress Neck: no carotid bruit and no JVD Lungs: clear to auscultation bilaterally Heart: regular rate and  rhythm, S1, S2 normal, no murmur, click, rub or gallop Abdomen: soft, non-tender; bowel sounds normal; no masses,  no organomegaly Extremities: extremities normal, atraumatic, no cyanosis or edema Pulses: 2+ and symmetric Skin: Skin color, texture, turgor normal. No rashes or lesions Neurologic: Grossly normal psych: Mood, affect normal  EKG: Normal sinus rhythm 75, moderate voltage criteria for LVH-personally reviewed  ASSESSMENT: 1. Hypertension-controlled 2. Dyslipidemia-at goal 3. Essential tremor 4. Obesity  PLAN: 1.   Mrs. Dicola seems to be doing well without any chest pain or worsening shortness of breath.  Her blood pressure is at goal.  Her cholesterol is well treated.  She has been undergoing some rehabilitation and is getting stronger.  We will follow-up with her annually or sooner as necessary.  Pixie Casino, MD, [facility], [place] Director of the [facility]  [facility] Diplomate of the American Board of Clinical Lipidology Attending Cardiologist  Direct Dial: [phone]  Fax: [phone]  Website:  [email or web address] 09/06/2017, 11:15 AM

## 2017-09-07 ENCOUNTER — Ambulatory Visit (HOSPITAL_COMMUNITY): Payer: Medicare Other | Admitting: Physical Therapy

## 2017-09-11 ENCOUNTER — Encounter (HOSPITAL_COMMUNITY): Payer: Self-pay

## 2017-09-11 ENCOUNTER — Ambulatory Visit (HOSPITAL_COMMUNITY): Payer: Medicare Other

## 2017-09-11 ENCOUNTER — Other Ambulatory Visit: Payer: Self-pay

## 2017-09-11 DIAGNOSIS — M533 Sacrococcygeal disorders, not elsewhere classified: Secondary | ICD-10-CM

## 2017-09-11 DIAGNOSIS — M545 Low back pain: Secondary | ICD-10-CM | POA: Diagnosis not present

## 2017-09-11 DIAGNOSIS — M6281 Muscle weakness (generalized): Secondary | ICD-10-CM

## 2017-09-11 DIAGNOSIS — G8929 Other chronic pain: Secondary | ICD-10-CM

## 2017-09-11 NOTE — Patient Instructions (Signed)
   SIDELYING CLAMSHELL: 1-2 sets of 15-20 repetitions  While lying on your side with your knees bent, draw up the top knee while keeping contact of your feet together.  Do not let your pelvis roll back during the lifting movement.

## 2017-09-11 NOTE — Therapy (Signed)
[place], [place], [address] Phone: [phone]   Fax:  [phone]  Physical Therapy Treatment  Patient Details  Name: Nicole Meadows MRN: [id number] Date of Birth: Jun 23, 1941 Referring Provider: Lucianne Lei, MD   Encounter Date: 09/11/2017  PT End of Session - 09/11/17 0938    Visit Number  2    Number of Visits  13    Date for PT Re-Evaluation  09/21/17    Authorization Type  [employer] Medicare    Authorization Time Period  08/31/17 - 10/12/17    PT Start Time  0905    PT Stop Time  0943    PT Time Calculation (min)  38 min    Activity Tolerance  Patient tolerated treatment well    Behavior During Therapy  [facility] for tasks assessed/performed       Past Medical History:  Diagnosis Date  . Anemia   . Arthritis   . Coronary artery disease    moderate, by cath  . Dyslipidemia   . Fibroid   . Glaucoma   . History of nuclear stress test 05/2010   bruce myoview; small fixed distal anteroapical, apical defect (breast attenuation or apical thinning), no reversible ischemia, post-stress EF 83%, abnormal but low risk study   . Hyperlipidemia   . Hypertension   . Tremor   . Vaginal dryness     Past Surgical History:  Procedure Laterality Date  . ABDOMINAL HYSTERECTOMY  1995  . BREAST SURGERY     Biopsy negative  . CARDIAC CATHETERIZATION  05/31/2007   no signficant CAD, low normal EF (Dr. Gerrie Nordmann)   . CATARACT EXTRACTION W/PHACO Right 03/02/2015   Procedure: CATARACT EXTRACTION PHACO AND INTRAOCULAR LENS PLACEMENT RIGHT EYE CDE=5.78;  Surgeon: [name], MD;  Location: [facility];  Service: Ophthalmology;  Laterality: Right;  . CATARACT EXTRACTION W/PHACO Left 03/19/2015   Procedure: CATARACT EXTRACTION PHACO AND INTRAOCULAR LENS PLACEMENT LEFT EYE CDE=5.44;  Surgeon: [name], MD;  Location: [facility];  Service: Ophthalmology;  Laterality: Left;  . TRANSTHORACIC ECHOCARDIOGRAM  2005   EF 60% with borderline conc LVH;  mild TR, RVSP 54mmHg   . TUBAL LIGATION  1977    There were no vitals filed for this visit.  Subjective Assessment - 09/11/17 0911    Subjective  Patient is feeling better since having been sick this past week. She reports she had been performign her HEP every day but when she got sick she stopped due to her congestion. She denies pain and states she has minimal to none for pain. She is still taking her steroids and her MD prescribed her another month of steroids to help with pain and inflammation.    Pertinent History  No surgeries; had very difficult child birth in 1967 resulting in SIJ dysfunction    Limitations  Sitting;Standing;Lifting;Walking    How long can you sit comfortably?  60 minutes    How long can you stand comfortably?  20 minutes    How long can you walk comfortably?  can usually walk about a mile, walking up a hilll bothered it recently    Diagnostic tests  X-Ray - in January - arthritis    Currently in Pain?  No/denies       [facility] Adult PT Treatment/Exercise - 09/11/17 0001      Lumbar Exercises: Standing   Functional Squats  10 reps;Limitations    Functional Squats Limitations  chair taps to facilitate slow  controlled lowering, cues for proper form/posture and to deter bil knee valgus    Other Standing Lumbar Exercises  1x 15 reps forward/lateral step ups Bil LE, 4" step       Lumbar Exercises: Supine   Pelvic Tilt  15 reps;5 seconds    Pelvic Tilt Limitations  verbal/tactile cues to flatten back against table    Bent Knee Raise  15 reps;Limitations    Bent Knee Raise Limitations  TA/posterior pelvic tilt before with Bil LE marching    Bridge  15 reps;3 seconds      Lumbar Exercises: Sidelying   Clam  Both;15 reps;2 seconds      Lumbar Exercises: Quadruped   Single Arm Raise  10 reps;Limitations    Single Arm Raises Limitations  10 Bil UE with cue for TA contraction during    Straight Leg Raise  10 reps;Limitations    Straight Leg Raises Limitations  10 Bil  LE with cue for TA contraction during        PT Education - 09/11/17 0916    Education provided  Yes    Education Details  reviewed eval and goals with patient. verbal/tactile cues to facilitate correct form/technique with exercises throughout    Person(s) Educated  Patient    Methods  Explanation;Tactile cues;Verbal cues    Comprehension  Verbalized understanding       PT Short Term Goals - 09/01/17 1525      PT SHORT TERM GOAL #1   Title  Patient will be independent with HEP to improve functional strength for improved mobility    Time  3    Period  Weeks    Status  New    Target Date  09/21/17      PT SHORT TERM GOAL #2   Title  Patient will increase MMT for limited group by 1/2 grade to demonstrate improved functional strength to improve gait and stair ambulation    Time  3    Period  Weeks    Status  New      PT SHORT TERM GOAL #3   Title  Patient will improve lumbar ROM by 25% for limited planes and have no pain in right flexion quadrant and left extension quadrant to improve tolerance to funcitonal mobility and transitional movements    Time  3    Period  Weeks    Status  New        PT Long Term Goals - 09/01/17 1528      PT LONG TERM GOAL #1   Title  Patient will increase MMT for limited group by 1 grade to demonstrate improved functional strength to improve gait and stair ambulation    Time  6    Period  Weeks    Status  New    Target Date  10/12/17      PT LONG TERM GOAL #2   Title  Patint will demonstrate increased abdominal muscle strength of 5/5 MMT indicating improved trunk stability to allow patient to perform transitional movements, and to return to gardening and walking for longer durations without pain.    Time  6    Period  Weeks    Status  New      PT LONG TERM GOAL #3   Title  Patient will ambulate at 1.2 m/s to demonstrate safe community ambulation; she will ascend/descend stairs with step over step pattern and no hand rail to demonstrate  improved functional strength.    Time  6    Period  Weeks    Status  New      PT LONG TERM GOAL #4   Title  Patient will return to participating in silver sneakers or walking program 3x/week or more to return to PLOF and demosntrate improved activity tolerance.    Time  6    Period  Weeks    Status  New       Plan - 09/11/17 6160    Clinical Impression Statement  Patient has initiated first treatment session since initial evaluation today and progressed functional core/LE strengthening with no increase in pain. She was educated on goals and proper frequency/form for exercises in HEP. She requires moderate tactile and verbal cues to perform exercises correctly throughout session. She remains limited by bilateral lower extremity and core weakness. She will benefit from skilled PT interventions to address current impairments and progress towards goals to reduce pain and improve QOL.    Rehab Potential  Good    PT Frequency  2x / week    PT Duration  6 weeks    PT Treatment/Interventions  ADLs/Self Care Home Management;Cryotherapy;Electrical Stimulation;Moist Heat;Gait training;Stair training;Functional mobility training;Therapeutic activities;Balance training;Neuromuscular re-education;Manual techniques;Passive range of motion;Dry needling;Taping;Energy conservation    PT Next Visit Plan  Continue with core exercises and TA contraction and posterior pelvic tilts and proximal hip strengthening. Next session initiate side stepping and wall slides.    PT Home Exercise Plan  Eval: bridge, posterior pelvic tilt; 09/11/17 - clamshell    Consulted and Agree with Plan of Care  Patient       Patient will benefit from skilled therapeutic intervention in order to improve the following deficits and impairments:  Abnormal gait, Increased fascial restricitons, Impaired sensation, Pain, Improper body mechanics, Postural dysfunction, Increased muscle spasms, Decreased mobility, Decreased activity tolerance,  Decreased endurance, Decreased range of motion, Decreased strength, Hypomobility, Decreased balance, Difficulty walking, Impaired flexibility  Visit Diagnosis: Chronic bilateral low back pain, with sciatica presence unspecified  Sacroiliac joint pain  Muscle weakness (generalized)     Problem List Patient Active Problem List   Diagnosis Date Noted  . Coronary artery calcification 08/29/2016  . Dyspnea 08/29/2016  . Mixed hyperlipidemia 08/29/2016  . Coronary artery disease involving native coronary artery of native heart with angina pectoris ([place]) 08/29/2016  . Obesity (BMI 30-39.9) 08/25/2015  . Essential hypertension 07/08/2013  . Dyslipidemia 07/08/2013  . Tremor     Kipp Brood, PT, DPT Physical Therapist with [facility]  09/11/2017 11:05 AM     [place] [address] [place], [place], [address] Phone: [phone]   Fax:  [phone]  Name: Nicole Meadows MRN: [id number] Date of Birth: 1941/02/18

## 2017-09-12 LAB — LIPID PANEL
Chol/HDL Ratio: 2.5 ratio (ref 0.0–4.4)
Cholesterol, Total: 160 mg/dL (ref 100–199)
HDL: 64 mg/dL (ref >39)
LDL Calculated: 73 mg/dL (ref 0–99)
Triglycerides: 115 mg/dL (ref 0–149)
VLDL Cholesterol Cal: 23 mg/dL (ref 5–40)

## 2017-09-13 ENCOUNTER — Encounter (HOSPITAL_COMMUNITY): Payer: Self-pay | Admitting: Physical Therapy

## 2017-09-13 ENCOUNTER — Ambulatory Visit (HOSPITAL_COMMUNITY): Payer: Medicare Other | Admitting: Physical Therapy

## 2017-09-13 DIAGNOSIS — M533 Sacrococcygeal disorders, not elsewhere classified: Secondary | ICD-10-CM

## 2017-09-13 DIAGNOSIS — M6281 Muscle weakness (generalized): Secondary | ICD-10-CM

## 2017-09-13 DIAGNOSIS — M545 Low back pain: Secondary | ICD-10-CM | POA: Diagnosis not present

## 2017-09-13 DIAGNOSIS — G8929 Other chronic pain: Secondary | ICD-10-CM

## 2017-09-13 NOTE — Therapy (Signed)
[place], [place], [address] Phone: [phone]   Fax:  [phone]  Physical Therapy Treatment  Patient Details  Name: Nicole Meadows MRN: [id number] Date of Birth: 1941/02/11 Referring Provider: Lucianne Lei, MD   Encounter Date: 09/13/2017  PT End of Session - 09/13/17 1000    Visit Number  3    Number of Visits  13    Date for PT Re-Evaluation  09/21/17    Authorization Type  [employer] Medicare    Authorization Time Period  08/31/17 - 10/12/17    PT Start Time  0955 patient arrived late    PT Stop Time  1033    PT Time Calculation (min)  38 min    Activity Tolerance  Patient tolerated treatment well    Behavior During Therapy  [facility] for tasks assessed/performed       Past Medical History:  Diagnosis Date  . Anemia   . Arthritis   . Coronary artery disease    moderate, by cath  . Dyslipidemia   . Fibroid   . Glaucoma   . History of nuclear stress test 05/2010   bruce myoview; small fixed distal anteroapical, apical defect (breast attenuation or apical thinning), no reversible ischemia, post-stress EF 83%, abnormal but low risk study   . Hyperlipidemia   . Hypertension   . Tremor   . Vaginal dryness     Past Surgical History:  Procedure Laterality Date  . ABDOMINAL HYSTERECTOMY  1995  . BREAST SURGERY     Biopsy negative  . CARDIAC CATHETERIZATION  05/31/2007   no signficant CAD, low normal EF (Dr. Gerrie Nordmann)   . CATARACT EXTRACTION W/PHACO Right 03/02/2015   Procedure: CATARACT EXTRACTION PHACO AND INTRAOCULAR LENS PLACEMENT RIGHT EYE CDE=5.78;  Surgeon: [name], MD;  Location: [facility];  Service: Ophthalmology;  Laterality: Right;  . CATARACT EXTRACTION W/PHACO Left 03/19/2015   Procedure: CATARACT EXTRACTION PHACO AND INTRAOCULAR LENS PLACEMENT LEFT EYE CDE=5.44;  Surgeon: [name], MD;  Location: [facility];  Service: Ophthalmology;  Laterality: Left;  . TRANSTHORACIC ECHOCARDIOGRAM  2005   EF 60% with  borderline conc LVH; mild TR, RVSP 8mmHg   . TUBAL LIGATION  1977    There were no vitals filed for this visit.  Subjective Assessment - 09/13/17 0957    Subjective  Patient reported that she is not having any pain upon entering therapy. Patient stated she has been doing her HEP at home. Patient stated that she will bring her evaluation in next session because she believes there were a couple things in her medical history that were entered in error.     Pertinent History  No surgeries; had very difficult child birth in 1967 resulting in SIJ dysfunction    Limitations  Sitting;Standing;Lifting;Walking    How long can you stand comfortably?  20 minutes    Currently in Pain?  No/denies                      [facility] Adult PT Treatment/Exercise - 09/13/17 0001      Lumbar Exercises: Standing   Functional Squats  Limitations;15 reps    Functional Squats Limitations  chair taps to facilitate slow controlled lowering, cues for proper form/posture and to deter bil knee valgus    Forward Lunge  10 reps each lower extremity    Side Lunge  10 reps each lower extremity    Wall Slides  10 reps;3 seconds  Wall squat with demonstration, verbal and tactile cues    Other Standing Lumbar Exercises  1x 15 reps forward/lateral step ups Bil LE, 4" step     Other Standing Lumbar Exercises  Sidestepping with red theraband around thighs 15 feet x 3 left and right      Lumbar Exercises: Supine   Pelvic Tilt  15 reps;5 seconds    Pelvic Tilt Limitations  verbal/tactile cues to flatten back against table    Bent Knee Raise  15 reps;Limitations    Bent Knee Raise Limitations  TA/posterior pelvic tilt before with Bil LE marching    Bridge  15 reps;3 seconds      Lumbar Exercises: Sidelying   Clam  Both;15 reps;2 seconds      Lumbar Exercises: Quadruped   Single Arm Raise  10 reps;Limitations    Single Arm Raises Limitations  10 Bil UE alternating with cue for TA contraction during    Straight Leg  Raise  10 reps;Limitations    Straight Leg Raises Limitations  10 Bil LE alternating with cue for TA contraction during             PT Education - 09/13/17 0959    Education provided  Yes    Education Details  Patient was educated on exercise purpose and technique throughout.     Person(s) Educated  Patient    Methods  Explanation;Tactile cues;Verbal cues    Comprehension  Verbalized understanding;Returned demonstration       PT Short Term Goals - 09/01/17 1525      PT SHORT TERM GOAL #1   Title  Patient will be independent with HEP to improve functional strength for improved mobility    Time  3    Period  Weeks    Status  New    Target Date  09/21/17      PT SHORT TERM GOAL #2   Title  Patient will increase MMT for limited group by 1/2 grade to demonstrate improved functional strength to improve gait and stair ambulation    Time  3    Period  Weeks    Status  New      PT SHORT TERM GOAL #3   Title  Patient will improve lumbar ROM by 25% for limited planes and have no pain in right flexion quadrant and left extension quadrant to improve tolerance to funcitonal mobility and transitional movements    Time  3    Period  Weeks    Status  New        PT Long Term Goals - 09/01/17 1528      PT LONG TERM GOAL #1   Title  Patient will increase MMT for limited group by 1 grade to demonstrate improved functional strength to improve gait and stair ambulation    Time  6    Period  Weeks    Status  New    Target Date  10/12/17      PT LONG TERM GOAL #2   Title  Patint will demonstrate increased abdominal muscle strength of 5/5 MMT indicating improved trunk stability to allow patient to perform transitional movements, and to return to gardening and walking for longer durations without pain.    Time  6    Period  Weeks    Status  New      PT LONG TERM GOAL #3   Title  Patient will ambulate at 1.2 m/s to demonstrate safe community ambulation; she will ascend/descend stairs  with step over step pattern and no hand rail to demonstrate improved functional strength.    Time  6    Period  Weeks    Status  New      PT LONG TERM GOAL #4   Title  Patient will return to participating in silver sneakers or walking program 3x/week or more to return to PLOF and demosntrate improved activity tolerance.    Time  6    Period  Weeks    Status  New            Plan - 09/13/17 1047    Clinical Impression Statement  This session began with a focus on core stabilization exercises on the mat. Then progressed to standing exercises with a focus on functional strengthening. This session added wall squats with patient required demonstration, verbal cues and tactile cues for form to not bring knees past toes and to widen base of support. Sidestepping was also added this session, which patient tolerated well. Also added lunges this session which patient required frequent demonstration, verbal cueing and tactile cueing to perform. Overall, patient continued to require frequent verbal and tactile cueing as well as demonstration throughout session. Patient would benefit from continued skilled physical therapy to continue addressing deficits in strength and overall functional mobility.     Rehab Potential  Good    PT Frequency  2x / week    PT Duration  6 weeks    PT Treatment/Interventions  ADLs/Self Care Home Management;Cryotherapy;Electrical Stimulation;Moist Heat;Gait training;Stair training;Functional mobility training;Therapeutic activities;Balance training;Neuromuscular re-education;Manual techniques;Passive range of motion;Dry needling;Taping;Energy conservation    PT Next Visit Plan  Continue with core exercises and TA contraction and posterior pelvic tilts and proximal hip strengthening. Next session initiate side stepping and wall slides.    PT Home Exercise Plan  Eval: bridge, posterior pelvic tilt; 09/11/17 - clamshell    Consulted and Agree with Plan of Care  Patient        Patient will benefit from skilled therapeutic intervention in order to improve the following deficits and impairments:  Abnormal gait, Increased fascial restricitons, Impaired sensation, Pain, Improper body mechanics, Postural dysfunction, Increased muscle spasms, Decreased mobility, Decreased activity tolerance, Decreased endurance, Decreased range of motion, Decreased strength, Hypomobility, Decreased balance, Difficulty walking, Impaired flexibility  Visit Diagnosis: Chronic bilateral low back pain, with sciatica presence unspecified  Sacroiliac joint pain  Muscle weakness (generalized)     Problem List Patient Active Problem List   Diagnosis Date Noted  . Coronary artery calcification 08/29/2016  . Dyspnea 08/29/2016  . Mixed hyperlipidemia 08/29/2016  . Coronary artery disease involving native coronary artery of native heart with angina pectoris ([place]) 08/29/2016  . Obesity (BMI 30-39.9) 08/25/2015  . Essential hypertension 07/08/2013  . Dyslipidemia 07/08/2013  . Tremor    Clarene Critchley PT, DPT 10:54 AM, 09/13/17 [place] [address] [place], [place], [address] Phone: [phone]   Fax:  [phone]  Name: ARNESHA SCHIRALDI MRN: [id number] Date of Birth: [birth year]

## 2017-09-18 ENCOUNTER — Ambulatory Visit: Payer: Medicare Other | Admitting: Neurology

## 2017-09-18 ENCOUNTER — Encounter: Payer: Self-pay | Admitting: Neurology

## 2017-09-18 VITALS — BP 132/72 | HR 76 | Ht 65.0 in | Wt 187.0 lb

## 2017-09-18 DIAGNOSIS — G250 Essential tremor: Secondary | ICD-10-CM | POA: Diagnosis not present

## 2017-09-18 NOTE — Progress Notes (Signed)
Subjective:    Patient ID: Nicole Meadows is a 77 y.o. female.  HPI     Interim history:   Nicole Meadows is a 76 year old right-handed woman with an underlying medical history of allergies, hypertension, reflux disease and obesity, who presents for follow-up consultation of her tremor. The patient is unaccompanied today. I first met her on 09/14/2016 at the request of her primary care physician, at which time she reported a long-standing history of tremors. I suggested observation of her overall mild head tremor, and recheck in one year routinely.  Today, 09/18/2017: She reports doing okay, feels stable. Doesn't notice the tremor unless she is tired. No significant progression noted. Her head tremor is also noticeable when she lifts her arm up and tries to write. Overall, she feels quite stable, she tries to take care of her health, she tries to hydrate well and limits her caffeine intake. She has had some issues with back pain and joint pain. She had a lumbar spine x-ray and SI joint x-ray per primary care physician in December 2018 which showed normal findings in the SI joint and mild degenerative changes in the lumbar spine. She has been taking Dexilant for her reflux disease as proton next was no longer helpful. She had noticed joint pain after she started the new reflux medication and it was stopped for about a month. However, her reflux flared up but joint pain improved. Now she is trying to take it only every other day or so.  The patient's allergies, current medications, family history, past medical history, past social history, past surgical history and problem list were reviewed and updated as appropriate.   Previously:  09/14/2016: (She) reports a long-standing history of tremors. Some 6 weeks ago she had an exacerbation of her tremor and it affected her whole body at the time. She did have joint pain at the time and also flareup in her headaches. She has a remote history of migraine  headaches but they improved significantly after she had her hysterectomy in 1995. She has occasional snoring, no morning headaches.  She has previously been seen by Dr. Erling Cruz in our office for many years. I reviewed an office note by Dr. Erling Cruz from 11/30/2010, at which time he talk to her about her head tremor and headaches. He had seen her many years ago in 2005 for recurrent headaches. She reported in 2012 that her head tremor was getting worse. She declined symptomatic medication at the time. She also had symptomatic dizziness at the time. I also reviewed an office note by Dr. love from 04/11/2008, at which time he reported that she had a long-standing history of essential tremor for which he saw her in 2005. He also saw her for headaches. He also mentions a prior visit from November 1998 for vertigo and essential tremor. He reported in 2009 that she had never been on Topamax her beta blocker. He suggested trial of Inderal versus Topamax but was hesitant to suggest Inderal because of prior history of pulmonary problems. They mutually agreed to forego treatment at the time. I reviewed blood test results from your office from 08/19/2016. CMP was unremarkable with the exception of calcium borderline at 11. CBC with differential was unremarkable. ESR was normal. Total cholesterol was 139, triglycerides 129, LDL 54. TSH normal. Hemoglobin A1c was 5.7, free T4 and free T3 were normal. Vitamin D was borderline at 29. She had a brain MRI with and without contrast on 08/22/2003 as well as a brain MRA  without contrast on 08/22/2003 which I reviewed: Brain MRA was normal, brain MRI was normal. She had a CTH wo contrast on 10/30/13: normal.  She had a recent nuclear stress test under Dr. Debara Pickett on 08/30/16, and I reviewed the results: Normal EF and no ischemia.  [facility], even lost weight, but d/t to her husband's health issues did not go as regularly as she did not want to leave him alone at  home.  She reports a recent incident of whole body trembling and had a few other episodes since then. She has a history of arthritis and had joint pains in both ankles and both knees and was given a tapering course of prednisone for 2 weeks, which started a week after the increase in tremor.  She has since then seen a rheumatologist last week, had a number of X rays, was told she has OA. She may have had a viral ill, her rheumatologist suspected d/t to flare up of joint pain and headaches last month.  Overall, she feels that since the flareup of her tremor she has reverted back to her baseline. She is not particularly bothered by her head tremor and does not report any hand tremor. She has a strong family history of tremors. Her father had hand tremors, she had a total of 9 siblings out of which 6 had some form of tremor including head, voice or hand tremors. She is very active, she is still able to do everything around the house, she drives without problems. She does not drink alcohol regularly, only on special occasions, she does not drink caffeine daily, coffee maybe 3 cups per week. She has 2 grown children. She lives with her husband, son and daughter-in-law. She is retired. She has occasional forgetfulness and mostly difficulty with names but nothing sinister.    Her Past Medical History Is Significant For: Past Medical History:  Diagnosis Date  . Anemia   . Arthritis   . Coronary artery disease    moderate, by cath  . Dyslipidemia   . Fibroid   . Glaucoma   . History of nuclear stress test 05/2010   bruce myoview; small fixed distal anteroapical, apical defect (breast attenuation or apical thinning), no reversible ischemia, post-stress EF 83%, abnormal but low risk study   . Hyperlipidemia   . Hypertension   . Tremor   . Vaginal dryness     Her Past Surgical History Is Significant For: Past Surgical History:  Procedure Laterality Date  . ABDOMINAL HYSTERECTOMY  1995  . BREAST  SURGERY     Biopsy negative  . CARDIAC CATHETERIZATION  05/31/2007   no signficant CAD, low normal EF (Dr. Gerrie Nordmann)   . CATARACT EXTRACTION W/PHACO Right 03/02/2015   Procedure: CATARACT EXTRACTION PHACO AND INTRAOCULAR LENS PLACEMENT RIGHT EYE CDE=5.78;  Surgeon: [name], MD;  Location: [facility];  Service: Ophthalmology;  Laterality: Right;  . CATARACT EXTRACTION W/PHACO Left 03/19/2015   Procedure: CATARACT EXTRACTION PHACO AND INTRAOCULAR LENS PLACEMENT LEFT EYE CDE=5.44;  Surgeon: [name], MD;  Location: [facility];  Service: Ophthalmology;  Laterality: Left;  . TRANSTHORACIC ECHOCARDIOGRAM  2005   EF 60% with borderline conc LVH; mild TR, RVSP 86mHg   . TUBAL LIGATION  1977    Her Family History Is Significant For: Family History  Problem Relation Age of Onset  . Hypertension Mother   . Sudden death Mother   . Heart disease Father   .  Kidney failure Father   . Cancer Sister        uterine  . Heart failure Brother        DM, HTN  . Aneurysm Brother        abdominal; also HTN  . Hypertension Brother        sudden death  . Hypertension Brother   . Hyperlipidemia Brother        also DM  . Hypertension Brother   . Other Brother        2 brain surgeries, bleeding    Her Social History Is Significant For: Social History   Socioeconomic History  . Marital status: Married    Spouse name: None  . Number of children: None  . Years of education: None  . Highest education level: None  Social Needs  . Financial resource strain: None  . Food insecurity - worry: None  . Food insecurity - inability: None  . Transportation needs - medical: None  . Transportation needs - non-medical: None  Occupational History  . None  Tobacco Use  . Smoking status: Never Smoker  . Smokeless tobacco: Never Used  Substance and Sexual Activity  . Alcohol use: No  . Drug use: No  . Sexual activity: None    Comment: Hysterectomy  Other Topics Concern  . None  Social History Narrative  .  None    Her Allergies Are:  Allergies  Allergen Reactions  . Hydrocodone Nausea And Vomiting  . Zocor [Simvastatin]     myalgias  . Latex Rash  . Penicillins Rash  :   Her Current Medications Are:  Outpatient Encounter Medications as of 09/18/2017  Medication Sig  . aspirin 81 MG tablet Take 81 mg by mouth daily.  [name] azelastine (ASTELIN) 137 MCG/SPRAY nasal spray Place 1 spray into the nose 2 (two) times daily. Use in each nostril as directed  . calcium citrate-vitamin D (CITRACAL+D) 315-200 MG-UNIT per tablet Take 1 tablet by mouth 2 (two) times daily.  . cholecalciferol (VITAMIN D) 1000 units tablet Take 2,000 Units by mouth daily.  . cycloSPORINE (RESTASIS) 0.05 % ophthalmic emulsion Place 1 drop into both eyes 2 (two) times daily.   [name] desonide (DESOWEN) 0.05 % cream Apply topically 2 (two) times daily.  [name] dexlansoprazole (DEXILANT) 60 MG capsule Take 60 mg by mouth daily.  [name] FLAX OIL-FISH OIL-BORAGE OIL PO Take 1 capsule by mouth daily.   . fluticasone (FLONASE) 50 MCG/ACT nasal spray Place 1 spray into both nostrils every morning.   . nitroGLYCERIN (NITROLINGUAL) 0.4 MG/SPRAY spray Place 1 spray under the tongue every 5 (five) minutes x 3 doses as needed for chest pain.  . predniSONE (DELTASONE) 2.5 MG tablet TAKE 1 TABLET EVERY DAY FOR ARTHRITIS  . rosuvastatin (CRESTOR) 5 MG tablet Take 5 mg by mouth daily.  [name] telmisartan-hydrochlorothiazide (MICARDIS HCT) 80-12.5 MG per tablet Take 1 tablet by mouth daily.  . [DISCONTINUED] betamethasone dipropionate (DIPROLENE) 0.05 % cream Apply 1 application topically daily as needed (dermatitis).    No facility-administered encounter medications on file as of 09/18/2017.   :  Review of Systems:  Out of a complete 14 point review of systems, all are reviewed and negative with the exception of these symptoms as listed below: Review of Systems  Neurological:       Pt presents today to discuss her tremor. Pt doesn't notice her tremor unless  she is tired.    Objective:  Neurological Exam  Physical Exam Physical  Examination:   Vitals:   09/18/17 1128  BP: 132/72  Pulse: 76   General Examination: The patient is a very pleasant 77 y.o. female in no acute distress. She appears well-developed and well-nourished and well groomed.   HEENT: Normocephalic, atraumatic, pupils are equal, round and reactive to light and accommodation. Extraocular tracking is good without limitation to gaze excursion or nystagmus noted. Normal smooth pursuit is noted. Hearing is grossly intact. Face is symmetric with normal facial animation and normal facial sensation. Speech is clear with no dysarthria noted. There is no hypophonia. ThereIs a mild head tremor, intermittent, and intermittent minimal voice tremor. Neck is supple with full range of passive and active motion. There are no carotid bruits on auscultation. Oropharynx exam reveals: mild mouth dryness, adequate dental hygiene. Tongue protrudes centrally and palate elevates symmetrically.   Chest: Clear to auscultation without wheezing, rhonchi or crackles noted.  Heart: S1+S2+0, regular and normal without murmurs, rubs or gallops noted.   Abdomen: Soft, non-tender and non-distended with normal bowel sounds appreciated on auscultation.  Extremities: There is no pitting edema in the distal lower extremities bilaterally. Pedal pulses are intact.  Skin: Warm and dry without trophic changes noted.  Musculoskeletal: exam reveals no obvious joint deformities, tenderness or joint swelling or erythema.   Neurologically:  Mental status: The patient is awake, alert and oriented in all 4 spheres. Her immediate and remote memory, attention, language skills and fund of knowledge are appropriate. There is no evidence of aphasia, agnosia, apraxia or anomia. Speech is clear with normal prosody and enunciation. Thought process is linear. Mood is normal and affect is normal.  Cranial nerves II - XII are  as described above under HEENT exam. In addition: shoulder shrug is normal with equal shoulder height noted. Motor exam: Normal bulk, strength and tone is noted. There is no drift, resting tremor or rebound. Romberg is negative.   (On 09/14/2016: Handwriting is not micrographic, very legible. On Archimedes spiral drawing she has no difficulty with her dominant hand which is the right and minimal trembling noted on the left).   She has no significant postural or action tremor. Reflexes are 2+ throughout. Fine motor skills and coordination: intact with normal finger taps, normal hand movements, normal rapid alternating patting, normal foot taps and normal foot agility.  Cerebellar testing: No dysmetria or intention tremor. There is no truncal or gait ataxia.  Sensory exam: intact to light touch, pinprick, vibration, temperature sense in the upper and lower extremities.  Gait, station and balance: She stands easily. No veering to one side is noted. No leaning to one side is noted. Posture is age-appropriate and stance is narrow based. Gait shows normal stride length and normal pace. No problems turning are noted. Tandem walk is unremarkable.        Assessment and Plan:  In summary, YOLA PARADISO is a very pleasant 77 year old female with an underlying medical history of allergies, hypertension, reflux disease and obesity, who presents for reevaluation of her head tremor. She has no significant hand tremor. She has had no significant progression and no recent flareup of her tremors, notices the tremor mostly when she is lifting her arms above her head or when she is concentrating, such as writing. She has no other complaints 10 days far as neurologically speaking. She had prior brain scans including head CT and brain MRI and MRA in the past. Exam is stable from my end of things, she is reassured in that regard and  she is encouraged to continue to hydrate well, rested well, utilize caffeine and limitation.  I suggested a one-year recheck routinely, sooner if needed. I answered all her questions today and she was in agreement.  I spent 15 minutes in total face-to-face time with the patient, more than 50% of which was spent in counseling and coordination of care, reviewing test results, reviewing medication and discussing or reviewing the diagnosis of ET, the prognosis and treatment options. Pertinent laboratory and imaging test results that were available during this visit with the patient were reviewed by me and considered in my medical decision making (see chart for details).

## 2017-09-18 NOTE — Patient Instructions (Addendum)
Keep up the good work with your hydration.  Your tremor is minimal and stable on my exam.  We can see you in one year.

## 2017-09-19 ENCOUNTER — Other Ambulatory Visit: Payer: Self-pay

## 2017-09-19 ENCOUNTER — Ambulatory Visit (HOSPITAL_COMMUNITY): Payer: Medicare Other

## 2017-09-19 ENCOUNTER — Encounter (HOSPITAL_COMMUNITY): Payer: Self-pay

## 2017-09-19 DIAGNOSIS — M545 Low back pain: Secondary | ICD-10-CM | POA: Diagnosis not present

## 2017-09-19 DIAGNOSIS — G8929 Other chronic pain: Secondary | ICD-10-CM

## 2017-09-19 DIAGNOSIS — M533 Sacrococcygeal disorders, not elsewhere classified: Secondary | ICD-10-CM

## 2017-09-19 DIAGNOSIS — M6281 Muscle weakness (generalized): Secondary | ICD-10-CM

## 2017-09-19 NOTE — Patient Instructions (Addendum)
   BRIDGING ELASTIC BAND ABDUCTION: 1-2 sets of 15 repetitions  While lying on your back, place an elastic band around your knees and pull your knees apart. Hold this and then tighten your lower abdominals, squeeze your buttocks and raise your buttocks off the floor/bed as creating a "Bridge" with your body.     Quadraped Hip Extension "donkey kicks": 1-2 sets of 10 repetitions  On your hands and knees, extend one leg behind you as much as possible. Keep spine stable and abdominals activated. Return to starting position, and repeat with the opposite leg.

## 2017-09-19 NOTE — Therapy (Signed)
[place], [place], [address] Phone: [phone]   Fax:  [phone]  Physical Therapy Treatment/Re-Assessment  Patient Details  Name: Nicole Meadows MRN: [id number] Date of Birth: 1941-07-24 Referring Provider: Lucianne Lei, MD   Encounter Date: 09/19/2017  PT End of Session - 09/19/17 1117    Visit Number  4    Number of Visits  13    Date for PT Re-Evaluation  10/10/17    Authorization Type  [employer] Medicare    Authorization Time Period  08/31/17 - 10/12/17    PT Start Time  1119    PT Stop Time  1159    PT Time Calculation (min)  40 min    Activity Tolerance  Patient tolerated treatment well    Behavior During Therapy  [facility] for tasks assessed/performed       Past Medical History:  Diagnosis Date  . Anemia   . Arthritis   . Coronary artery disease    moderate, by cath  . Dyslipidemia   . Fibroid   . Glaucoma   . History of nuclear stress test 05/2010   bruce myoview; small fixed distal anteroapical, apical defect (breast attenuation or apical thinning), no reversible ischemia, post-stress EF 83%, abnormal but low risk study   . Hyperlipidemia   . Hypertension   . Tremor   . Vaginal dryness     Past Surgical History:  Procedure Laterality Date  . ABDOMINAL HYSTERECTOMY  1995  . BREAST SURGERY     Biopsy negative  . CARDIAC CATHETERIZATION  05/31/2007   no signficant CAD, low normal EF (Dr. Gerrie Nordmann)   . CATARACT EXTRACTION W/PHACO Right 03/02/2015   Procedure: CATARACT EXTRACTION PHACO AND INTRAOCULAR LENS PLACEMENT RIGHT EYE CDE=5.78;  Surgeon: [name], MD;  Location: [facility];  Service: Ophthalmology;  Laterality: Right;  . CATARACT EXTRACTION W/PHACO Left 03/19/2015   Procedure: CATARACT EXTRACTION PHACO AND INTRAOCULAR LENS PLACEMENT LEFT EYE CDE=5.44;  Surgeon: [name], MD;  Location: [facility];  Service: Ophthalmology;  Laterality: Left;  . TRANSTHORACIC ECHOCARDIOGRAM  2005   EF 60% with  borderline conc LVH; mild TR, RVSP 25mHg   . TUBAL LIGATION  1977    There were no vitals filed for this visit.  Subjective Assessment - 09/19/17 1217    Subjective  Patient reports she is feelign well and denies pain in therapy. She has been doign her HEP at least once a day usually twice. She states she had a doctors appointment yesterday with her neurologist and states they believe her balance has improved and reports her essential tremor remains stabel with no change. She states she is hoping to start going back to the gym next week.    Pertinent History  No surgeries; had very difficult child birth in 1967 resulting in SIJ dysfunction    Limitations  Sitting;Standing;Lifting;Walking    Currently in Pain?  No/denies         [facility]      Assessment   Medical Diagnosis  Low Back Pain    Prior Therapy  yes, years ago for her back after surgeries      AROM   Lumbar Flexion  25% limited    Lumbar Extension  WNL pain free    Lumbar - Right Side Bend  WNL, tight in right low back    Lumbar - Left Side Bend  WNL stretch in right low back  Lumbar - Right Rotation  WNL    Lumbar - Left Rotation  WNL      Strength   Right Hip Flexion  4+/5    Right Hip Extension  5/5    Right Hip ABduction  5/5    Left Hip Flexion  5/5    Left Hip Extension  5/5    Left Hip ABduction  5/5    Right Knee Flexion  5/5    Right Knee Extension  5/5    Left Knee Flexion  5/5    Left Knee Extension  5/5    Right Ankle Dorsiflexion  5/5    Left Ankle Dorsiflexion  5/5      Flexibility   Hamstrings  Bil limited: 90* at hip, 160 for knee extension        [facility] Adult PT Treatment/Exercise - 09/19/17 0001      Ambulation/Gait   Ambulation/Gait  Yes    Ambulation Distance (Feet)  956 Feet    Gait Pattern  Within Functional Limits    Ambulation Surface  Level    Gait velocity  1.6 m/s      Lumbar Exercises: Standing   Wall Slides  15 reps;3 seconds;Limitations     Wall Slides Limitations  --    Other Standing Lumbar Exercises  1x 15 reps forward step ups Bil LE, 6" step     Other Standing Lumbar Exercises  Sidestepping with green theraband around thighs 15 feet x 3 left and right      Lumbar Exercises: Supine   Bridge  15 reps;3 seconds;Limitations    Bridge Limitations  red theraband for abduction, 2 sets      Lumbar Exercises: Quadruped   Straight Leg Raise  10 reps;Limitations    Straight Leg Raises Limitations  2 sets; 10 Bil LE alternating with cue for TA contraction during    Opposite Arm/Leg Raise  Right arm/Left leg;Left arm/Right leg;10 reps;Limitations    Opposite Arm/Leg Raise Limitations  2 sets        PT Education - 09/19/17 1138    Education provided  Yes    Education Details  Educated on progress towards goals and on continuation of POC. Patient agreeable to decreasing frequency to 1x per week. Educated on exercises and updated HEP. Educated on initiating regualr exercise program at [employer]) Educated  Patient    Methods  Explanation;Handout    Comprehension  Verbalized understanding;Returned demonstration       PT Short Term Goals - 09/19/17 1126      PT SHORT TERM GOAL #1   Title  Patient will be independent with HEP to improve functional strength for improved mobility    Time  3    Period  Weeks    Status  Achieved      PT SHORT TERM GOAL #2   Title  Patient will increase MMT for limited group by 1/2 grade to demonstrate improved functional strength to improve gait and stair ambulation    Baseline  09/19/17 - all improved by 1 grade or more    Time  3    Period  Weeks    Status  Achieved      PT SHORT TERM GOAL #3   Title  Patient will improve lumbar ROM by 25% for limited planes and have no pain in right flexion quadrant and left extension quadrant to improve tolerance to funcitonal mobility and transitional movements    Baseline  09/19/17 -  improved by 25 %, continues to have some discomfort in right  low back    Time  3    Period  Weeks    Status  Partially Met        PT Long Term Goals - 09/19/17 1127      PT LONG TERM GOAL #1   Title  Patient will increase MMT for limited group by 1 grade to demonstrate improved functional strength to improve gait and stair ambulation    Baseline  09/19/17 - all improved by 1 grade or more    Time  6    Period  Weeks    Status  Achieved      PT LONG TERM GOAL #2   Title  Patint will demonstrate increased abdominal muscle strength of 5/5 MMT indicating improved trunk stability to allow patient to perform transitional movements, and to return to gardening and walking for longer durations without pain.    Baseline  09/19/17 - all 5/5 exceptiong of right hip flexion whichis 4+/5    Time  6    Period  Weeks    Status  Partially Met      PT LONG TERM GOAL #3   Title  Patient will ambulate at 1.2 m/s to demonstrate safe community ambulation; she will ascend/descend stairs with step over step pattern and no hand rail to demonstrate improved functional strength.    Baseline  09/19/17 - 1.6 m/s for 3 MWT    Time  6    Period  Weeks    Status  Achieved      PT LONG TERM GOAL #4   Title  Patient will return to participating in silver sneakers or walking program 3x/week or more to return to PLOF and demosntrate improved activity tolerance.    Baseline  09/19/17 - has not returned but plans to    Time  6    Period  Weeks    Status  On-going        Plan - 09/19/17 1157    Clinical Impression Statement  Re-assessment performed today and Nicole Meadows has met 3/3 short term goals and 3/4 long term goals. She has made significant improvement to Bil LE strength and has improved lumbar ROM. She continues to have some discomfort with extended activity and has not initiated walking program or returned to her local gym yet. Given the progress the patient has made towards her goals her frequency of therapy will be adjusted to 1x/week and she has been instructed to  replace her second therapy session with returning to the gym to improve independence with activity and regular exercise. Nicole Meadows will continue to benefit from skilled PT to achieve remaining goals and educate on safe progression of HEP and return to gym activity.    Rehab Potential  Good    PT Frequency  2x / week    PT Duration  6 weeks    PT Treatment/Interventions  ADLs/Self Care Home Management;Cryotherapy;Electrical Stimulation;Moist Heat;Gait training;Stair training;Functional mobility training;Therapeutic activities;Balance training;Neuromuscular re-education;Manual techniques;Passive range of motion;Dry needling;Taping;Energy conservation    PT Next Visit Plan  Continue with functional strengthenign and core strenghtening. Progress dynamic step up with UE flexion for balance challenge. progress to SL bridge.    PT Home Exercise Plan  Eval: bridge, posterior pelvic tilt; 09/11/17 - clamshell; 09/19/17 - donkey kick, bridge with TB for abduction    Consulted and Agree with Plan of Care  Patient       Patient will  benefit from skilled therapeutic intervention in order to improve the following deficits and impairments:  Abnormal gait, Increased fascial restricitons, Impaired sensation, Pain, Improper body mechanics, Postural dysfunction, Increased muscle spasms, Decreased mobility, Decreased activity tolerance, Decreased endurance, Decreased range of motion, Decreased strength, Hypomobility, Decreased balance, Difficulty walking, Impaired flexibility  Visit Diagnosis: Chronic bilateral low back pain, with sciatica presence unspecified  Sacroiliac joint pain  Muscle weakness (generalized)     Problem List Patient Active Problem List   Diagnosis Date Noted  . Coronary artery calcification 08/29/2016  . Dyspnea 08/29/2016  . Mixed hyperlipidemia 08/29/2016  . Coronary artery disease involving native coronary artery of native heart with angina pectoris ([place]) 08/29/2016  . Obesity  (BMI 30-39.9) 08/25/2015  . Essential hypertension 07/08/2013  . Dyslipidemia 07/08/2013  . Tremor     Kipp Brood, PT, DPT Physical Therapist with [facility]  09/19/2017 12:19 PM    [place] [address] [place], [place], [address] Phone: [phone]   Fax:  [phone]  Name: Nicole Meadows MRN: [id number] Date of Birth: 1941-02-19

## 2017-09-22 ENCOUNTER — Encounter (HOSPITAL_COMMUNITY): Payer: Medicare Other

## 2017-09-25 ENCOUNTER — Encounter (HOSPITAL_COMMUNITY): Payer: Self-pay

## 2017-09-25 ENCOUNTER — Other Ambulatory Visit: Payer: Self-pay

## 2017-09-25 ENCOUNTER — Ambulatory Visit (HOSPITAL_COMMUNITY): Payer: Medicare Other | Attending: Family Medicine

## 2017-09-25 DIAGNOSIS — H409 Unspecified glaucoma: Secondary | ICD-10-CM

## 2017-09-25 DIAGNOSIS — G8929 Other chronic pain: Secondary | ICD-10-CM | POA: Insufficient documentation

## 2017-09-25 DIAGNOSIS — M6281 Muscle weakness (generalized): Secondary | ICD-10-CM | POA: Insufficient documentation

## 2017-09-25 DIAGNOSIS — M545 Low back pain: Secondary | ICD-10-CM | POA: Diagnosis not present

## 2017-09-25 DIAGNOSIS — M533 Sacrococcygeal disorders, not elsewhere classified: Secondary | ICD-10-CM | POA: Diagnosis present

## 2017-09-25 HISTORY — DX: Unspecified glaucoma: H40.9

## 2017-09-25 NOTE — Therapy (Signed)
[place], [place], [address] Phone: [phone]   Fax:  [phone]  Physical Therapy Treatment  Patient Details  Name: Nicole Meadows MRN: [id number] Date of Birth: 10/27/1940 Referring Provider: Lucianne Lei, MD   Encounter Date: 09/25/2017  PT End of Session - 09/25/17 1004    Visit Number  5    Number of Visits  13    Date for PT Re-Evaluation  10/10/17    Authorization Type  [employer] Medicare    Authorization Time Period  08/31/17 - 10/12/17    PT Start Time  0948    PT Stop Time  1030    PT Time Calculation (min)  42 min    Activity Tolerance  Patient tolerated treatment well    Behavior During Therapy  [facility] for tasks assessed/performed       Past Medical History:  Diagnosis Date  . Anemia   . Arthritis   . Coronary artery disease    moderate, by cath  . Dyslipidemia   . Fibroid   . Glaucoma 09/25/2017   patient denies having this or being diagnosed with this ever  . History of nuclear stress test 05/2010   bruce myoview; small fixed distal anteroapical, apical defect (breast attenuation or apical thinning), no reversible ischemia, post-stress EF 83%, abnormal but low risk study   . Hyperlipidemia   . Hypertension   . Tremor   . Vaginal dryness     Past Surgical History:  Procedure Laterality Date  . ABDOMINAL HYSTERECTOMY  1995  . BREAST SURGERY     Biopsy negative  . CARDIAC CATHETERIZATION  05/31/2007   no signficant CAD, low normal EF (Dr. Gerrie Nordmann)   . CATARACT EXTRACTION W/PHACO Right 03/02/2015   Procedure: CATARACT EXTRACTION PHACO AND INTRAOCULAR LENS PLACEMENT RIGHT EYE CDE=5.78;  Surgeon: [name], MD;  Location: [facility];  Service: Ophthalmology;  Laterality: Right;  . CATARACT EXTRACTION W/PHACO Left 03/19/2015   Procedure: CATARACT EXTRACTION PHACO AND INTRAOCULAR LENS PLACEMENT LEFT EYE CDE=5.44;  Surgeon: [name], MD;  Location: [facility];  Service: Ophthalmology;  Laterality: Left;   . TRANSTHORACIC ECHOCARDIOGRAM  2005   EF 60% with borderline conc LVH; mild TR, RVSP 87mHg   . TUBAL LIGATION  1977    There were no vitals filed for this visit.  Subjective Assessment - 09/25/17 0956    Subjective  Patient reports she went to her MD last Thursday and they are pleased with her progress. She states she is being weaned off the prednisone and that her pain has remained low while taking less medication. She states it only aches when she is doing some exercises but goes away with her rest breaks. She brought in her initial evaluation and had several questions about her medical history and stated she wanted some items corrected, specifically that she has never been diagnosed with glaucoma. She reports that she went to the [employer] 1x last week and is planning to go again this week. She reports no discomfort/pain in her low back except with some exercise but reports it goes away immediately.    Pertinent History  No surgeries; had very difficult child birth in 1967 resulting in SIJ dysfunction    Limitations  Sitting;Standing;Lifting;Walking    Pain Score  3  with some exercises; none with rest    Pain Location  Back    Pain Orientation  Lower    Pain Descriptors / Indicators  Aching  Pain Type  Chronic pain    Pain Onset  More than a month ago    Pain Frequency  Intermittent    Multiple Pain Sites  No        [facility] Adult PT Treatment/Exercise - 09/25/17 0001      Lumbar Exercises: Standing   Wall Slides  15 reps;3 seconds;Limitations    Wall Slides Limitations  2 sets    Other Standing Lumbar Exercises  1x 15 reps forward/lateral step ups Bil LE, 4" step with contralateral knee drive    Other Standing Lumbar Exercises  Sidestepping/Monster walk with green theraband around thighs 15 feet x 2 left and right      Lumbar Exercises: Supine   Bent Knee Raise  15 reps;Limitations    Bent Knee Raise Limitations  2 sets; TA/posterior pelvic tilt before with Bil LE marching     Single Leg Bridge  3 seconds;Limitations;10 reps 2 sets of 10 Bil LE      Lumbar Exercises: Quadruped   Opposite Arm/Leg Raise  Right arm/Left leg;Left arm/Right leg;10 reps;Limitations    Opposite Arm/Leg Raise Limitations  2 sets         PT Education - 09/25/17 1004    Education provided  Yes    Education Details  educated on exercise form throughout session. Encouraged to participate in gym routine 2x/week.     Person(s) Educated  Patient    Methods  Explanation    Comprehension  Returned demonstration;Verbalized understanding       PT Short Term Goals - 09/19/17 1126      PT SHORT TERM GOAL #1   Title  Patient will be independent with HEP to improve functional strength for improved mobility    Time  3    Period  Weeks    Status  Achieved      PT SHORT TERM GOAL #2   Title  Patient will increase MMT for limited group by 1/2 grade to demonstrate improved functional strength to improve gait and stair ambulation    Baseline  09/19/17 - all improved by 1 grade or more    Time  3    Period  Weeks    Status  Achieved      PT SHORT TERM GOAL #3   Title  Patient will improve lumbar ROM by 25% for limited planes and have no pain in right flexion quadrant and left extension quadrant to improve tolerance to funcitonal mobility and transitional movements    Baseline  09/19/17 - improved by 25 %, continues to have some discomfort in right low back    Time  3    Period  Weeks    Status  Partially Met        PT Long Term Goals - 09/19/17 1127      PT LONG TERM GOAL #1   Title  Patient will increase MMT for limited group by 1 grade to demonstrate improved functional strength to improve gait and stair ambulation    Baseline  09/19/17 - all improved by 1 grade or more    Time  6    Period  Weeks    Status  Achieved      PT LONG TERM GOAL #2   Title  Patint will demonstrate increased abdominal muscle strength of 5/5 MMT indicating improved trunk stability to allow patient to  perform transitional movements, and to return to gardening and walking for longer durations without pain.    Baseline  09/19/17 -  all 5/5 exceptiong of right hip flexion whichis 4+/5    Time  6    Period  Weeks    Status  Partially Met      PT LONG TERM GOAL #3   Title  Patient will ambulate at 1.2 m/s to demonstrate safe community ambulation; she will ascend/descend stairs with step over step pattern and no hand rail to demonstrate improved functional strength.    Baseline  09/19/17 - 1.6 m/s for 3 MWT    Time  6    Period  Weeks    Status  Achieved      PT LONG TERM GOAL #4   Title  Patient will return to participating in silver sneakers or walking program 3x/week or more to return to PLOF and demosntrate improved activity tolerance.    Baseline  09/19/17 - has not returned but plans to    Time  6    Period  Weeks    Status  On-going         Plan - 09/25/17 1005    Clinical Impression Statement  Patient is making good progress with therapy and has mild discomfort with advanced exercises, however repots it goes away completely during rest. She has initiated participation in gym routine and was instructed to continue this week and participate in 2 days of gym recreation rather than just 1 day/week. She was able to progress balance and strengthening exercises with no increase in pain and required cues for "monster walks" this session. Once she has demonstrated good form with exercise Ms. Poynor can add this to her HEP. She will continue to benefit from skilled PT to achieve remaining goals and educate on safe progression of HEP and return to gym activity.    Rehab Potential  Good    PT Frequency  1x / week    PT Duration  6 weeks    PT Treatment/Interventions  ADLs/Self Care Home Management;Cryotherapy;Electrical Stimulation;Moist Heat;Gait training;Stair training;Functional mobility training;Therapeutic activities;Balance training;Neuromuscular re-education;Manual techniques;Passive range  of motion;Dry needling;Taping;Energy conservation    PT Next Visit Plan  Continue with functional strengthening and core strenghtening. Progress dynamic step up with UE flexion for balance challenge. progress to SL bridge. Continue with monster walks, and add to HEP when patient no longer requires cues for form.    PT Home Exercise Plan  Eval: bridge, posterior pelvic tilt; 09/11/17 - clamshell; 09/19/17 - donkey kick, bridge with TB for abduction    Consulted and Agree with Plan of Care  Patient       Patient will benefit from skilled therapeutic intervention in order to improve the following deficits and impairments:  Abnormal gait, Increased fascial restricitons, Impaired sensation, Pain, Improper body mechanics, Postural dysfunction, Increased muscle spasms, Decreased mobility, Decreased activity tolerance, Decreased endurance, Decreased range of motion, Decreased strength, Hypomobility, Decreased balance, Difficulty walking, Impaired flexibility  Visit Diagnosis: Chronic bilateral low back pain, with sciatica presence unspecified  Sacroiliac joint pain  Muscle weakness (generalized)     Problem List Patient Active Problem List   Diagnosis Date Noted  . Coronary artery calcification 08/29/2016  . Dyspnea 08/29/2016  . Mixed hyperlipidemia 08/29/2016  . Coronary artery disease involving native coronary artery of native heart with angina pectoris ([place]) 08/29/2016  . Obesity (BMI 30-39.9) 08/25/2015  . Essential hypertension 07/08/2013  . Dyslipidemia 07/08/2013  . Tremor     Kipp Brood, PT, DPT Physical Therapist with [facility]  09/25/2017 10:43 AM    [place]  [facility] [address] [place], [place], [address] Phone: [phone]   Fax:  [phone]  Name: Nicole Meadows MRN: [id number] Date of Birth: [birth year]

## 2017-09-27 ENCOUNTER — Encounter (HOSPITAL_COMMUNITY): Payer: Medicare Other

## 2017-10-05 ENCOUNTER — Encounter (HOSPITAL_COMMUNITY): Payer: Self-pay

## 2017-10-05 ENCOUNTER — Ambulatory Visit (HOSPITAL_COMMUNITY): Payer: Medicare Other

## 2017-10-05 ENCOUNTER — Other Ambulatory Visit: Payer: Self-pay

## 2017-10-05 DIAGNOSIS — M545 Low back pain: Principal | ICD-10-CM

## 2017-10-05 DIAGNOSIS — M533 Sacrococcygeal disorders, not elsewhere classified: Secondary | ICD-10-CM

## 2017-10-05 DIAGNOSIS — G8929 Other chronic pain: Secondary | ICD-10-CM

## 2017-10-05 DIAGNOSIS — M6281 Muscle weakness (generalized): Secondary | ICD-10-CM

## 2017-10-05 NOTE — Therapy (Signed)
[place], [place], [address] Phone: [phone]   Fax:  [phone]  Physical Therapy Treatment  Patient Details  Name: Nicole Meadows MRN: [id number] Date of Birth: 28-Aug-1940 Referring Provider: Lucianne Lei, MD   Encounter Date: 10/05/2017  PT End of Session - 10/05/17 1313    Visit Number  6    Number of Visits  13    Date for PT Re-Evaluation  10/10/17    Authorization Type  [employer] Medicare    Authorization Time Period  08/31/17 - 10/12/17    PT Start Time  1304    PT Stop Time  1343    PT Time Calculation (min)  39 min    Activity Tolerance  Patient tolerated treatment well    Behavior During Therapy  [facility] for tasks assessed/performed       Past Medical History:  Diagnosis Date  . Anemia   . Arthritis   . Coronary artery disease    moderate, by cath  . Dyslipidemia   . Fibroid   . Glaucoma 09/25/2017   patient denies having this or being diagnosed with this ever  . History of nuclear stress test 05/2010   bruce myoview; small fixed distal anteroapical, apical defect (breast attenuation or apical thinning), no reversible ischemia, post-stress EF 83%, abnormal but low risk study   . Hyperlipidemia   . Hypertension   . Tremor   . Vaginal dryness     Past Surgical History:  Procedure Laterality Date  . ABDOMINAL HYSTERECTOMY  1995  . BREAST SURGERY     Biopsy negative  . CARDIAC CATHETERIZATION  05/31/2007   no signficant CAD, low normal EF (Dr. Gerrie Nordmann)   . CATARACT EXTRACTION W/PHACO Right 03/02/2015   Procedure: CATARACT EXTRACTION PHACO AND INTRAOCULAR LENS PLACEMENT RIGHT EYE CDE=5.78;  Surgeon: [name], MD;  Location: [facility];  Service: Ophthalmology;  Laterality: Right;  . CATARACT EXTRACTION W/PHACO Left 03/19/2015   Procedure: CATARACT EXTRACTION PHACO AND INTRAOCULAR LENS PLACEMENT LEFT EYE CDE=5.44;  Surgeon: [name], MD;  Location: [facility];  Service: Ophthalmology;  Laterality: Left;   . TRANSTHORACIC ECHOCARDIOGRAM  2005   EF 60% with borderline conc LVH; mild TR, RVSP 57mHg   . TUBAL LIGATION  1977    There were no vitals filed for this visit.  Subjective Assessment - 10/05/17 1309    Subjective  Patient feels well todaya nd reports she has been attendign the gym each week. She denies pain. Patient states she still have difficulty with balance exercises.     Pertinent History  No surgeries; had very difficult child birth in 1967 resulting in SIJ dysfunction    Limitations  Sitting;Standing;Lifting;Walking    Currently in Pain?  No/denies         [facility] 0001      Lumbar Exercises: Standing   Other Standing Lumbar Exercises  1x 15 reps forward/lateral step ups Bil LE, 6" step with contralateral knee drive    Other Standing Lumbar Exercises  3x 15 ft both directions Sidestepping/ 1x15 ft Monster walk both directions with green theraband around thighs      Lumbar Exercises: Supine   Pelvic Tilt  20 seconds;5 seconds    Pelvic Tilt Limitations  patient with improved carryover, no cues needed    Bent Knee Raise  20 reps    Bent Knee Raise Limitations  TA/posterior pelvic tilt before with Bil LE marching  Single Leg Bridge  15 reps;3 seconds    Straight Leg Raise  20 reps;3 seconds      Lumbar Exercises: Quadruped   Madcat/Old Horse  --    Opposite Arm/Leg Raise  Right arm/Left leg;Left arm/Right leg;Limitations;15 reps         PT Education - 10/05/17 1311    Education provided  Yes    Education Details  Educated on exercise throughout session. Updated HEP.    Person(s) Educated  Patient    Methods  Explanation;Handout    Comprehension  Verbalized understanding       PT Short Term Goals - 09/19/17 1126      PT SHORT TERM GOAL #1   Title  Patient will be independent with HEP to improve functional strength for improved mobility    Time  3    Period  Weeks    Status  Achieved      PT SHORT TERM GOAL #2    Title  Patient will increase MMT for limited group by 1/2 grade to demonstrate improved functional strength to improve gait and stair ambulation    Baseline  09/19/17 - all improved by 1 grade or more    Time  3    Period  Weeks    Status  Achieved      PT SHORT TERM GOAL #3   Title  Patient will improve lumbar ROM by 25% for limited planes and have no pain in right flexion quadrant and left extension quadrant to improve tolerance to funcitonal mobility and transitional movements    Baseline  09/19/17 - improved by 25 %, continues to have some discomfort in right low back    Time  3    Period  Weeks    Status  Partially Met        PT Long Term Goals - 09/19/17 1127      PT LONG TERM GOAL #1   Title  Patient will increase MMT for limited group by 1 grade to demonstrate improved functional strength to improve gait and stair ambulation    Baseline  09/19/17 - all improved by 1 grade or more    Time  6    Period  Weeks    Status  Achieved      PT LONG TERM GOAL #2   Title  Patint will demonstrate increased abdominal muscle strength of 5/5 MMT indicating improved trunk stability to allow patient to perform transitional movements, and to return to gardening and walking for longer durations without pain.    Baseline  09/19/17 - all 5/5 exceptiong of right hip flexion whichis 4+/5    Time  6    Period  Weeks    Status  Partially Met      PT LONG TERM GOAL #3   Title  Patient will ambulate at 1.2 m/s to demonstrate safe community ambulation; she will ascend/descend stairs with step over step pattern and no hand rail to demonstrate improved functional strength.    Baseline  09/19/17 - 1.6 m/s for 3 MWT    Time  6    Period  Weeks    Status  Achieved      PT LONG TERM GOAL #4   Title  Patient will return to participating in silver sneakers or walking program 3x/week or more to return to PLOF and demosntrate improved activity tolerance.    Baseline  09/19/17 - has not returned but plans to     Time  6      Period  Weeks    Status  On-going         Plan - 10/05/17 1310    Clinical Impression Statement  Patient is progressing well and arrived with no pain today. She reports good attendance to local gym and reports no pain with gym routine. Core exercise were progressed today and patient demonstrated improved form with side stepping and required fewer cues to achieve proper form with "monster walks" this session. She will continue to benefit from skilled PT to achieve remaining goals and educate on safe progression of HEP and return to gym activity.    Rehab Potential  Good    PT Frequency  1x / week    PT Duration  6 weeks    PT Treatment/Interventions  ADLs/Self Care Home Management;Cryotherapy;Electrical Stimulation;Moist Heat;Gait training;Stair training;Functional mobility training;Therapeutic activities;Balance training;Neuromuscular re-education;Manual techniques;Passive range of motion;Dry needling;Taping;Energy conservation    PT Next Visit Plan  Continue with functional strengthening and core strengthening; continue with monster walks, and add to HEP when patient no longer requires cues for form. Advance balance training.    PT Home Exercise Plan  Eval: bridge, posterior pelvic tilt; 09/11/17 - clamshell; 09/19/17 - donkey kick, bridge with TB for abduction    Consulted and Agree with Plan of Care  Patient       Patient will benefit from skilled therapeutic intervention in order to improve the following deficits and impairments:  Abnormal gait, Increased fascial restricitons, Impaired sensation, Pain, Improper body mechanics, Postural dysfunction, Increased muscle spasms, Decreased mobility, Decreased activity tolerance, Decreased endurance, Decreased range of motion, Decreased strength, Hypomobility, Decreased balance, Difficulty walking, Impaired flexibility  Visit Diagnosis: Chronic bilateral low back pain, with sciatica presence unspecified  Sacroiliac joint pain  Muscle  weakness (generalized)     Problem List Patient Active Problem List   Diagnosis Date Noted  . Coronary artery calcification 08/29/2016  . Dyspnea 08/29/2016  . Mixed hyperlipidemia 08/29/2016  . Coronary artery disease involving native coronary artery of native heart with angina pectoris (HCC) 08/29/2016  . Obesity (BMI 30-39.9) 08/25/2015  . Essential hypertension 07/08/2013  . Dyslipidemia 07/08/2013  . Tremor     Rachel Quinn-Brown, PT, DPT Physical Therapist with [facility]  10/05/2017 1:49 PM      [facility] [address] [place], [place], [address] Phone: [phone]   Fax:  [phone]  Name: Chesnee F Cyran MRN: [id number] Date of Birth: 03/08/1941   

## 2017-10-05 NOTE — Patient Instructions (Addendum)
   ELASTIC BAND LATERAL WALKS - 2-3 times, 15 steps each direction With an elastic band around your thighs, take steps to the side while keeping your feet spread apart. Keep your knees bent the entire time.

## 2017-10-09 ENCOUNTER — Other Ambulatory Visit: Payer: Self-pay

## 2017-10-09 ENCOUNTER — Ambulatory Visit (HOSPITAL_COMMUNITY): Payer: Medicare Other

## 2017-10-09 ENCOUNTER — Encounter (HOSPITAL_COMMUNITY): Payer: Self-pay

## 2017-10-09 DIAGNOSIS — M545 Low back pain: Principal | ICD-10-CM

## 2017-10-09 DIAGNOSIS — M6281 Muscle weakness (generalized): Secondary | ICD-10-CM

## 2017-10-09 DIAGNOSIS — G8929 Other chronic pain: Secondary | ICD-10-CM

## 2017-10-09 DIAGNOSIS — M533 Sacrococcygeal disorders, not elsewhere classified: Secondary | ICD-10-CM

## 2017-10-09 NOTE — Therapy (Signed)
[place], [place], [address] Phone: [phone]   Fax:  [phone]  Physical Therapy Treatment  Patient Details  Name: Nicole Meadows MRN: [id number] Date of Birth: [birth year] Referring Provider: Lucianne Lei, MD   Encounter Date: 10/09/2017  PT End of Session - 10/09/17 1003    Visit Number  7    Number of Visits  13    Date for PT Re-Evaluation  10/10/17    Authorization Type  [employer] Medicare    Authorization Time Period  08/31/17 - 10/12/17    PT Start Time  0948    PT Stop Time  1031    PT Time Calculation (min)  43 min    Activity Tolerance  Patient tolerated treatment well    Behavior During Therapy  [facility] for tasks assessed/performed       Past Medical History:  Diagnosis Date  . Anemia   . Arthritis   . Coronary artery disease    moderate, by cath  . Dyslipidemia   . Fibroid   . Glaucoma 09/25/2017   patient denies having this or being diagnosed with this ever  . History of nuclear stress test 05/2010   bruce myoview; small fixed distal anteroapical, apical defect (breast attenuation or apical thinning), no reversible ischemia, post-stress EF 83%, abnormal but low risk study   . Hyperlipidemia   . Hypertension   . Tremor   . Vaginal dryness     Past Surgical History:  Procedure Laterality Date  . ABDOMINAL HYSTERECTOMY  1995  . BREAST SURGERY     Biopsy negative  . CARDIAC CATHETERIZATION  05/31/2007   no signficant CAD, low normal EF (Dr. Gerrie Nordmann)   . CATARACT EXTRACTION W/PHACO Right 03/02/2015   Procedure: CATARACT EXTRACTION PHACO AND INTRAOCULAR LENS PLACEMENT RIGHT EYE CDE=5.78;  Surgeon: [name], MD;  Location: [facility];  Service: Ophthalmology;  Laterality: Right;  . CATARACT EXTRACTION W/PHACO Left 03/19/2015   Procedure: CATARACT EXTRACTION PHACO AND INTRAOCULAR LENS PLACEMENT LEFT EYE CDE=5.44;  Surgeon: [name], MD;  Location: [facility];  Service: Ophthalmology;  Laterality: Left;   . TRANSTHORACIC ECHOCARDIOGRAM  2005   EF 60% with borderline conc LVH; mild TR, RVSP 38mHg   . TUBAL LIGATION  1977    There were no vitals filed for this visit.  Subjective Assessment - 10/09/17 0952    Subjective  Patient reports she did not have any problems with new HEP exercise and that she was able to perform her exercises daily this weekend. [facility] She also is planning to walk at least 3 days. She states at [employer] they have a gym she can walk at before each meeting. She reports she went for a walk this weekend for about 1/2 a mile and had no discomfort.    Pertinent History  No surgeries; had very difficult child birth in 1967 resulting in SIJ dysfunction    Limitations  Sitting;Standing;Lifting;Walking    Currently in Pain?  No/denies         [facility] - 10/[facility]17 0001      Lumbar Exercises: Seated   Other Seated Lumbar Exercises  1x 10 sciatic nerve glide Lt LE, 10 second holds      Lumbar Exercises: Supine   Pelvic Tilt  20 reps;5 seconds    Pelvic Tilt Limitations  2 sets    Bent Knee Raise  20 reps    Bent Knee Raise Limitations  TA/posterior pelvic tilt before with Bil LE marching    Bridge  20 reps;3 seconds    Single Leg Bridge  10 reps;3 seconds    Straight Leg Raise  20 reps;3 seconds      Lumbar Exercises: Quadruped   Madcat/Old Horse  10 reps;Limitations    Madcat/Old Horse Limitations  2 sets; verbal, visual, manual cues to facilitate proper form    Opposite Arm/Leg Raise  Right arm/Left leg;Left arm/Right leg;Limitations;10 reps    Opposite Arm/Leg Raise Limitations  2 sets        PT Education - 10/09/17 0955    Education provided  Yes    Education Details  Educated on exercise form throughout session. Updated HEP.    Person(s) Educated  Patient    Methods  Explanation;Demonstration    Comprehension  Verbalized understanding;Returned demonstration       PT Short  Term Goals - 09/19/17 1126      PT SHORT TERM GOAL #1   Title  Patient will be independent with HEP to improve functional strength for improved mobility    Time  3    Period  Weeks    Status  Achieved      PT SHORT TERM GOAL #2   Title  Patient will increase MMT for limited group by 1/2 grade to demonstrate improved functional strength to improve gait and stair ambulation    Baseline  09/19/17 - all improved by 1 grade or more    Time  3    Period  Weeks    Status  Achieved      PT SHORT TERM GOAL #3   Title  Patient will improve lumbar ROM by 25% for limited planes and have no pain in right flexion quadrant and left extension quadrant to improve tolerance to funcitonal mobility and transitional movements    Baseline  09/19/17 - improved by 25 %, continues to have some discomfort in right low back    Time  3    Period  Weeks    Status  Partially Met        PT Long Term Goals - 09/19/17 1127      PT LONG TERM GOAL #1   Title  Patient will increase MMT for limited group by 1 grade to demonstrate improved functional strength to improve gait and stair ambulation    Baseline  09/19/17 - all improved by 1 grade or more    Time  6    Period  Weeks    Status  Achieved      PT LONG TERM GOAL #2   Title  Patint will demonstrate increased abdominal muscle strength of 5/5 MMT indicating improved trunk stability to allow patient to perform transitional movements, and to return to gardening and walking for longer durations without pain.    Baseline  09/19/17 - all 5/5 exceptiong of right hip flexion whichis 4+/5    Time  6    Period  Weeks    Status  Partially Met      PT LONG TERM GOAL #3   Title  Patient will ambulate at 1.2 m/s to demonstrate safe community ambulation; she will ascend/descend stairs with step over step pattern and no hand rail to demonstrate improved functional strength.    Baseline  09/19/17 - 1.6 m/s for 3 MWT    Time  6    Period  Weeks    Status  Achieved      PT  LONG TERM GOAL #4   Title  Patient will return to participating in silver sneakers or walking program 3x/week or more to return to PLOF and demosntrate improved activity tolerance.    Baseline  09/19/17 - has not returned but plans to    Time  6    Period  Weeks    Status  On-going        Plan - 10/09/17 1012    Clinical Impression Statement  Patient arrived with no pain today and continues to advance exercises with no increase in pain. She continues to have greater right hip weakness compared to left with greater fatigue during strengthening. She continues to show good carryover from exercises in previous session but requires demonstration and manual facilitation to achieve proper form with new exercises. Nicole Meadows also demonstrates consistent motivation with HEP and initiating regular walking program and gym routine. She will continue to benefit from skilled PT to achieve remaining goals and educate on safe progression of HEP and return to gym activity.    Rehab Potential  Good    PT Frequency  1x / week    PT Duration  6 weeks    PT Treatment/Interventions  ADLs/Self Care Home Management;Cryotherapy;Electrical Stimulation;Moist Heat;Gait training;Stair training;Functional mobility training;Therapeutic activities;Balance training;Neuromuscular re-education;Manual techniques;Passive range of motion;Dry needling;Taping;Energy conservation    PT Next Visit Plan  Re-assess goals, need new cert. Assess response to sciatic nerve glide and add to HEP for Right LE next session if positive response. Continue with functional strengthening and core strengthening; continue with monster walks, and add to HEP when patient no longer requires cues for form. Advance balance training.    PT Home Exercise Plan  Eval: bridge, posterior pelvic tilt; 09/11/17 - clamshell; 09/19/17 - donkey kick, bridge with TB for abduction; 10/09/17 - supine marching with TA    Consulted and Agree with Plan of Care  Patient        Patient will benefit from skilled therapeutic intervention in order to improve the following deficits and impairments:  Abnormal gait, Increased fascial restricitons, Impaired sensation, Pain, Improper body mechanics, Postural dysfunction, Increased muscle spasms, Decreased mobility, Decreased activity tolerance, Decreased endurance, Decreased range of motion, Decreased strength, Hypomobility, Decreased balance, Difficulty walking, Impaired flexibility  Visit Diagnosis: Chronic bilateral low back pain, with sciatica presence unspecified  Sacroiliac joint pain  Muscle weakness (generalized)     Problem List Patient Active Problem List   Diagnosis Date Noted  . Coronary artery calcification 08/29/2016  . Dyspnea 08/29/2016  . Mixed hyperlipidemia 08/29/2016  . Coronary artery disease involving native coronary artery of native heart with angina pectoris ([place]) 08/29/2016  . Obesity (BMI 30-39.9) 08/25/2015  . Essential hypertension 07/08/2013  . Dyslipidemia 07/08/2013  . Tremor     Kipp Brood, PT, DPT Physical Therapist with [facility]  10/09/2017 12:16 PM    [place] [address] [place], [place], [address] Phone: [phone]   Fax:  [phone]  Name: Nicole Meadows MRN: [id number] Date of Birth: Jan 11, 1941

## 2017-10-09 NOTE — Patient Instructions (Signed)
   BRACE SUPINE MARCHING: 1-2 sets of 20 repetitions  While lying on your back with your knees bent, flatten back against the table while contracting abdominal muscles as if pulling belly button, then slowly raise up one foot a few inches and then set it back down.  Next, perform on your other leg.  Use your stomach muscles to keep your spine from moving. Re-tilt your pelvis/flatten your back against the table before each set of marching.

## 2017-10-17 ENCOUNTER — Ambulatory Visit (HOSPITAL_COMMUNITY): Payer: Medicare Other

## 2017-10-17 ENCOUNTER — Telehealth (HOSPITAL_COMMUNITY): Payer: Self-pay

## 2017-10-17 ENCOUNTER — Encounter (HOSPITAL_COMMUNITY): Payer: Self-pay

## 2017-10-17 ENCOUNTER — Other Ambulatory Visit: Payer: Self-pay

## 2017-10-17 DIAGNOSIS — M545 Low back pain: Principal | ICD-10-CM

## 2017-10-17 DIAGNOSIS — M533 Sacrococcygeal disorders, not elsewhere classified: Secondary | ICD-10-CM

## 2017-10-17 DIAGNOSIS — G8929 Other chronic pain: Secondary | ICD-10-CM

## 2017-10-17 DIAGNOSIS — M6281 Muscle weakness (generalized): Secondary | ICD-10-CM

## 2017-10-17 NOTE — Therapy (Signed)
[place], [place], [address] Phone: [phone]   Fax:  [phone]  Physical Therapy Treatment/Discharge Summary  Patient Details  Name: Nicole Meadows MRN: [id number] Date of Birth: 20-Jul-1941 Referring Provider: Lucianne Lei, MD   Encounter Date: 10/17/2017    PHYSICAL THERAPY DISCHARGE SUMMARY  Visits from Start of Care: 8  Current functional level related to goals / functional outcomes: Re-assessment performed today and Nicole Meadows has met all short term goals and long term goals. She has made significant improvement to Bil LE strength and has improved lumbar ROM WNL for all planes. Her hamstring flexibility has improve Bil as well by 20 degrees with her hip at 90 degrees. She has demonstrated good motivation and compliance with participating in HEP and initiating recreational/regular exercise routine at gym and with walking program. She has stated she feels confident that she can continue progressing independently with the tools provided from therapy. She will be discharged following this session.     Remaining deficits: Met all goals. Some soreness following exercise, patient managing successfully with ice.    Education / Equipment:  Educated on progress towards goals and updated HEP for discharge. Educated on safe continuation and progression of recreational strengthening/walking program.     Plan: Patient agrees to discharge.  Patient goals were met. Patient is being discharged due to meeting the stated rehab goals.  ?????      PT End of Session - 10/17/17 0834    Visit Number  8    Number of Visits  13    Date for PT Re-Evaluation  10/10/17    Authorization Type  [employer] Medicare    Authorization Time Period  08/31/17 - 10/12/17    PT Start Time  0819    PT Stop Time  0846 discharging    PT Time Calculation (min)  27 min    Activity Tolerance  Patient tolerated treatment well    Behavior During  Therapy  [facility] for tasks assessed/performed       Past Medical History:  Diagnosis Date  . Anemia   . Arthritis   . Coronary artery disease    moderate, by cath  . Dyslipidemia   . Fibroid   . Glaucoma 09/25/2017   patient denies having this or being diagnosed with this ever  . History of nuclear stress test 05/2010   bruce myoview; small fixed distal anteroapical, apical defect (breast attenuation or apical thinning), no reversible ischemia, post-stress EF 83%, abnormal but low risk study   . Hyperlipidemia   . Hypertension   . Tremor   . Vaginal dryness     Past Surgical History:  Procedure Laterality Date  . ABDOMINAL HYSTERECTOMY  1995  . BREAST SURGERY     Biopsy negative  . CARDIAC CATHETERIZATION  05/31/2007   no signficant CAD, low normal EF (Dr. Gerrie Nordmann)   . CATARACT EXTRACTION W/PHACO Right 03/02/2015   Procedure: CATARACT EXTRACTION PHACO AND INTRAOCULAR LENS PLACEMENT RIGHT EYE CDE=5.78;  Surgeon: [name], MD;  Location: [facility];  Service: Ophthalmology;  Laterality: Right;  . CATARACT EXTRACTION W/PHACO Left 03/19/2015   Procedure: CATARACT EXTRACTION PHACO AND INTRAOCULAR LENS PLACEMENT LEFT EYE CDE=5.44;  Surgeon: [name], MD;  Location: [facility];  Service: Ophthalmology;  Laterality: Left;  . TRANSTHORACIC ECHOCARDIOGRAM  2005   EF 60% with borderline conc LVH; mild TR, RVSP 110mHg   . TUBAL LIGATION  1977    There were no vitals  filed for this visit.  Subjective Assessment - 10/17/17 0820    Subjective  Patient reports she went to the [employer] 1x last week, walked 3x last week (about 30 minutes two of those days), and also went to a the gym at her senior group once. She states she had some minor pain with exercises and she used ice pack at low back which relieved it. She denies pain at this time and has none at rest. She reports feeling confident in continuing to strengthen on her own.    Pertinent History  No surgeries; had very difficult child birth in 1967  resulting in SIJ dysfunction    Limitations  Sitting;Standing;Lifting;Walking    How long can you sit comfortably?  unlimited dependent on the chair    How long can you stand comfortably?  without a heel no problem    How long can you walk comfortably?  30 minutes without pain, can walk over a mile    Diagnostic tests  X-Ray - in January - arthritis    Currently in Pain?  No/denies    Pain Score  0-No pain         [facility] PT Assessment - 10/17/17 0001      Assessment   Medical Diagnosis  Low Back Pain    Referring Provider  Lucianne Lei, MD    Onset Date/Surgical Date  -- 12 years ago    Next MD Visit  next Tuesday     Prior Therapy  yes, years ago for her back after surgeries      AROM   Lumbar Flexion  WNL pain free was 25% limited on 09/19/17    Lumbar Extension  WNL pain free    Lumbar - Right Side Bend  WNL pain free    Lumbar - Left Side Bend  WNL pain free    Lumbar - Right Rotation  WNL pain free    Lumbar - Left Rotation  WNL pain free      Strength   Right Hip Flexion  5/5 was 4+/5    Right Hip Extension  5/5    Right Hip ABduction  5/5    Left Hip Flexion  5/5    Left Hip Extension  5/5    Left Hip ABduction  5/5    Right Knee Flexion  5/5    Right Knee Extension  5/5    Left Knee Flexion  5/5    Left Knee Extension  5/5    Right Ankle Dorsiflexion  5/5    Left Ankle Dorsiflexion  5/5      Flexibility   Soft Tissue Assessment /Muscle Length  no was Yes    Hamstrings  Bil limited: 90* at hip, 180 for knee extension was 160* bilatearlly        No data recorded    [facility] Adult PT Treatment/Exercise - 10/17/17 0001      Lumbar Exercises: Standing   Wall Slides  20 reps;3 seconds      Lumbar Exercises: Supine   Bent Knee Raise  20 reps    Bent Knee Raise Limitations  TA/posterior pelvic tilt before with Bil LE marching    Other Supine Lumbar Exercises  1x 10 each side, oblique crunch         PT Education - 10/17/17 0848    Education provided  Yes     Education Details  Educated on progress towards goals and updated HEP for discharge. Educated on safe continuation and  progression of recreational strengthening/walking program.     Person(s) Educated  Patient    Methods  Explanation;Handout    Comprehension  Verbalized understanding       PT Short Term Goals - 10/17/17 6759      PT SHORT TERM GOAL #1   Title  Patient will be independent with HEP to improve functional strength for improved mobility    Time  3    Period  Weeks    Status  Achieved      PT SHORT TERM GOAL #2   Title  Patient will increase MMT for limited group by 1/2 grade to demonstrate improved functional strength to improve gait and stair ambulation    Baseline  09/19/17 - all improved by 1 grade or more    Time  3    Period  Weeks    Status  Achieved      PT SHORT TERM GOAL #3   Title  Patient will improve lumbar ROM by 25% for limited planes and have no pain in right flexion quadrant and left extension quadrant to improve tolerance to funcitonal mobility and transitional movements    Baseline  09/19/17 - improved by 25 %, continues to have some discomfort in right low back    Time  3    Period  Weeks    Status  Partially Met        PT Long Term Goals - 10/17/17 1638      PT LONG TERM GOAL #1   Title  Patient will increase MMT for limited group by 1 grade to demonstrate improved functional strength to improve gait and stair ambulation    Baseline  09/19/17 - all improved by 1 grade or more    Time  6    Period  Weeks    Status  Achieved      PT LONG TERM GOAL #2   Title  Patint will demonstrate increased abdominal muscle strength of 5/5 MMT indicating improved trunk stability to allow patient to perform transitional movements, and to return to gardening and walking for longer durations without pain.    Baseline  --    Time  6    Period  Weeks    Status  Achieved      PT LONG TERM GOAL #3   Title  Patient will ambulate at 1.2 m/s to demonstrate safe  community ambulation; she will ascend/descend stairs with step over step pattern and no hand rail to demonstrate improved functional strength.    Baseline  09/19/17 - 1.6 m/s for 3 MWT    Time  6    Period  Weeks    Status  Achieved      PT LONG TERM GOAL #4   Title  Patient will return to participating in silver sneakers or walking program 3x/week or more to return to PLOF and demosntrate improved activity tolerance.    Baseline  10/17/17 - 5x of exercise/walking this past week    Time  6    Period  Weeks    Status  Achieved            Plan - 10/17/17 0835    Clinical Impression Statement  Re-assessment performed today and Nicole Meadows has met all short term goals and long term goals. She has made significant improvement to Bil LE strength and has improved lumbar ROM WNL for all planes. Her hamstring flexibility has improve Bil as well by 20 degrees with her hip at  90 degrees. She has demonstrated good motivation and compliance with participating in HEP and initiating recreational/regular exercise routine at gym and with walking program. She has stated she feels confident that she can continue progressing independently with the tools provided from therapy. She will be discharged following this session.     Rehab Potential  Good    PT Frequency  1x / week    PT Duration  6 weeks    PT Treatment/Interventions  ADLs/Self Care Home Management;Cryotherapy;Electrical Stimulation;Moist Heat;Gait training;Stair training;Functional mobility training;Therapeutic activities;Balance training;Neuromuscular re-education;Manual techniques;Passive range of motion;Dry needling;Taping;Energy conservation    PT Next Visit Plan  Discharge this session    PT Home Exercise Plan  Eval: bridge, posterior pelvic tilt; 09/11/17 - clamshell; 09/19/17 - donkey kick, bridge with TB for abduction; 10/09/17 - supine marching with TA; 10/17/17 - single leg bridge, oblique crunh, wall slide    Consulted and Agree with Plan of  Care  Patient       Patient will benefit from skilled therapeutic intervention in order to improve the following deficits and impairments:  Abnormal gait, Increased fascial restricitons, Impaired sensation, Pain, Improper body mechanics, Postural dysfunction, Increased muscle spasms, Decreased mobility, Decreased activity tolerance, Decreased endurance, Decreased range of motion, Decreased strength, Hypomobility, Decreased balance, Difficulty walking, Impaired flexibility  Visit Diagnosis: Chronic bilateral low back pain, with sciatica presence unspecified  Sacroiliac joint pain  Muscle weakness (generalized)     Problem List Patient Active Problem List   Diagnosis Date Noted  . Coronary artery calcification 08/29/2016  . Dyspnea 08/29/2016  . Mixed hyperlipidemia 08/29/2016  . Coronary artery disease involving native coronary artery of native heart with angina pectoris ([place]) 08/29/2016  . Obesity (BMI 30-39.9) 08/25/2015  . Essential hypertension 07/08/2013  . Dyslipidemia 07/08/2013  . Tremor     Kipp Brood, PT, DPT Physical Therapist with [facility]  10/17/2017 8:56 AM    [place] [address] [place], [place], [address] Phone: [phone]   Fax:  [phone]  Name: GRACELYNN BIRCHER MRN: [id number] Date of Birth: 1941-07-01

## 2017-10-17 NOTE — Patient Instructions (Addendum)
   SINGLE LEG BRIDGE: 1-2 sets of 10-20 repetitions  While lying on your back with your knees bent, extend one knee as shown.   Next, raise your buttocks off the floor/bed.    Try and maintain your pelvis level the entire time.     Obliques: 1-2 sets of 10-20   Start lying on your back with knees bent and both feet flat on the table. Lift one leg so the hip is at roughly 90 degrees. Place the hands behind the head and crunch up, rotating the opposite elbow towards the raised knee. Do 5 repetitions and then switch to the other side       WALL SQUATS: 1-2 sets of 10-20   Leaning up against a wall or closed door on your back, slide your body downward and then return back to upright position.  A door was used here because it was smoother and had less friction than the wall.   Knees should bend in line with the 2nd toe and not pass the front of the foot.

## 2017-10-17 NOTE — Telephone Encounter (Signed)
Patient was discharged from PT

## 2017-10-25 ENCOUNTER — Encounter (HOSPITAL_COMMUNITY): Payer: Medicare Other

## 2017-10-30 ENCOUNTER — Encounter (HOSPITAL_COMMUNITY): Payer: Medicare Other

## 2017-11-06 ENCOUNTER — Encounter (HOSPITAL_COMMUNITY): Payer: Medicare Other

## 2017-11-16 ENCOUNTER — Encounter (HOSPITAL_COMMUNITY): Payer: Medicare Other

## 2017-11-20 ENCOUNTER — Encounter (HOSPITAL_COMMUNITY): Payer: Medicare Other

## 2017-12-21 ENCOUNTER — Other Ambulatory Visit (HOSPITAL_COMMUNITY)
Admission: RE | Admit: 2017-12-21 | Discharge: 2017-12-21 | Disposition: A | Discharge status: Home / Self Care | Payer: Medicare Other | Source: Ambulatory Visit | Attending: Family Medicine | Admitting: Family Medicine

## 2017-12-21 ENCOUNTER — Other Ambulatory Visit: Payer: Self-pay | Admitting: Family Medicine

## 2017-12-21 DIAGNOSIS — N898 Other specified noninflammatory disorders of vagina: Secondary | ICD-10-CM | POA: Diagnosis present

## 2017-12-21 DIAGNOSIS — Z113 Encounter for screening for infections with a predominantly sexual mode of transmission: Secondary | ICD-10-CM | POA: Diagnosis present

## 2017-12-26 LAB — URINE CYTOLOGY ANCILLARY ONLY
Bacterial vaginitis: NEGATIVE
Candida vaginitis: NEGATIVE
Chlamydia: NEGATIVE
Neisseria Gonorrhea: NEGATIVE
Trichomonas: NEGATIVE

## 2018-01-28 ENCOUNTER — Emergency Department (HOSPITAL_COMMUNITY)
Admission: EM | Admit: 2018-01-28 | Discharge: 2018-01-28 | Disposition: A | Discharge status: Home / Self Care | Payer: Medicare Other | Attending: Emergency Medicine | Admitting: Emergency Medicine

## 2018-01-28 ENCOUNTER — Encounter (HOSPITAL_COMMUNITY): Payer: Self-pay | Admitting: Emergency Medicine

## 2018-01-28 ENCOUNTER — Other Ambulatory Visit: Payer: Self-pay

## 2018-01-28 ENCOUNTER — Emergency Department (HOSPITAL_COMMUNITY): Payer: Medicare Other

## 2018-01-28 DIAGNOSIS — K219 Gastro-esophageal reflux disease without esophagitis: Secondary | ICD-10-CM | POA: Diagnosis not present

## 2018-01-28 DIAGNOSIS — Z7982 Long term (current) use of aspirin: Secondary | ICD-10-CM | POA: Insufficient documentation

## 2018-01-28 DIAGNOSIS — I10 Essential (primary) hypertension: Secondary | ICD-10-CM | POA: Diagnosis not present

## 2018-01-28 DIAGNOSIS — R079 Chest pain, unspecified: Secondary | ICD-10-CM

## 2018-01-28 DIAGNOSIS — I25119 Atherosclerotic heart disease of native coronary artery with unspecified angina pectoris: Secondary | ICD-10-CM | POA: Diagnosis not present

## 2018-01-28 DIAGNOSIS — Z79899 Other long term (current) drug therapy: Secondary | ICD-10-CM | POA: Insufficient documentation

## 2018-01-28 DIAGNOSIS — Z91040 Latex allergy status: Secondary | ICD-10-CM | POA: Diagnosis not present

## 2018-01-28 LAB — TROPONIN I
Troponin I: <0.03 ng/mL (ref <0.03)
Troponin I: <0.03 ng/mL (ref <0.03)

## 2018-01-28 LAB — CBC
HCT: 42.1 % (ref 36.0–46.0)
Hemoglobin: 13.7 g/dL (ref 12.0–15.0)
MCH: 30.2 pg (ref 26.0–34.0)
MCHC: 32.5 g/dL (ref 30.0–36.0)
MCV: 92.9 fL (ref 78.0–100.0)
Platelets: 195 10*3/uL (ref 150–400)
RBC: 4.53 MIL/uL (ref 3.87–5.11)
RDW: 13 % (ref 11.5–15.5)
WBC: 4.5 10*3/uL (ref 4.0–10.5)

## 2018-01-28 LAB — COMPREHENSIVE METABOLIC PANEL
ALT: 24 U/L (ref 0–44)
AST: 33 U/L (ref 15–41)
Albumin: 4 g/dL (ref 3.5–5.0)
Alkaline Phosphatase: 71 U/L (ref 38–126)
Anion gap: 8 (ref 5–15)
BUN: 14 mg/dL (ref 8–23)
CO2: 31 mmol/L (ref 22–32)
Calcium: 10 mg/dL (ref 8.9–10.3)
Chloride: 102 mmol/L (ref 98–111)
Creatinine, Ser: 0.94 mg/dL (ref 0.44–1.00)
GFR calc Af Amer: >60 mL/min (ref >60)
GFR calc non Af Amer: 58 mL/min — ABNORMAL LOW (ref >60)
Glucose, Bld: 104 mg/dL — ABNORMAL HIGH (ref 70–99)
Potassium: 3.3 mmol/L — ABNORMAL LOW (ref 3.5–5.1)
Sodium: 141 mmol/L (ref 135–145)
Total Bilirubin: 0.6 mg/dL (ref 0.3–1.2)
Total Protein: 6.9 g/dL (ref 6.5–8.1)

## 2018-01-28 LAB — D-DIMER, QUANTITATIVE: D-Dimer, Quant: 0.57 ug/mL-FEU — ABNORMAL HIGH (ref 0.00–0.50)

## 2018-01-28 LAB — I-STAT TROPONIN, ED: Troponin i, poc: 0.01 ng/mL (ref 0.00–0.08)

## 2018-01-28 LAB — LIPASE, BLOOD: Lipase: 24 U/L (ref 11–51)

## 2018-01-28 MED ORDER — GI COCKTAIL ~~LOC~~
30.0000 mL | Freq: Once | ORAL | Status: AC
  Administered 2018-01-28: 30 mL via ORAL
  Filled 2018-01-28: qty 30

## 2018-01-28 MED ORDER — SUCRALFATE 1 G PO TABS: 1.0000 g | ORAL_TABLET | Freq: Three times a day (TID) | ORAL | 0 refills | Status: DC

## 2018-01-28 MED ORDER — ASPIRIN 81 MG PO CHEW
324.0000 mg | CHEWABLE_TABLET | Freq: Once | ORAL | Status: AC
  Administered 2018-01-28: 324 mg via ORAL
  Filled 2018-01-28: qty 4

## 2018-01-28 MED ORDER — PANTOPRAZOLE SODIUM 20 MG PO TBEC: 20.0000 mg | DELAYED_RELEASE_TABLET | Freq: Every day | ORAL | 0 refills | Status: DC

## 2018-01-28 NOTE — ED Notes (Signed)
ED Provider at bedside. 

## 2018-01-28 NOTE — ED Triage Notes (Signed)
Pt reports having indigestion last night and then 30 minutes prior to arrival began having sharp pain in chest around under left breast to back.  Denies sob, nausea, diaphoresis.  Has had loose stool x 2 this morning.

## 2018-01-28 NOTE — ED Provider Notes (Signed)
[facility] EMERGENCY DEPARTMENT Provider Note   CSN: [id number] Arrival date & time: 01/28/18  0751     History   Chief Complaint Chief Complaint  Patient presents with  . Chest Pain    HPI CATHLYN Meadows is a 77 y.o. female.  HPI Patient presents to the emergency room for evaluation of chest pain.  Patient states last evening she had some burping and indigestion.  Symptoms were not too severe and she went to bed.  This morning however at about 7 AM she had sharp pain underneath her left breast that radiated to her back.  The symptoms will last a few minutes and then come and go.  She denies any trouble with shortness of breath or nausea.  No diaphoresis.  She denies any abdominal pain.  Denies any cough.  No history of any exertional component.  Patient has a history of coronary artery disease but has not had any stents or any interventions.  Denies any history of PE or DVT. Past Medical History:  Diagnosis Date  . Anemia   . Arthritis   . Coronary artery disease    moderate, by cath  . Dyslipidemia   . Fibroid   . Glaucoma 09/25/2017   patient denies having this or being diagnosed with this ever  . History of nuclear stress test 05/2010   bruce myoview; small fixed distal anteroapical, apical defect (breast attenuation or apical thinning), no reversible ischemia, post-stress EF 83%, abnormal but low risk study   . Hyperlipidemia   . Hypertension   . Tremor   . Vaginal dryness     Patient Active Problem List   Diagnosis Date Noted  . Coronary artery calcification 08/29/2016  . Dyspnea 08/29/2016  . Mixed hyperlipidemia 08/29/2016  . Coronary artery disease involving native coronary artery of native heart with angina pectoris ([place]) 08/29/2016  . Obesity (BMI 30-39.9) 08/25/2015  . Essential hypertension 07/08/2013  . Dyslipidemia 07/08/2013  . Tremor     Past Surgical History:  Procedure Laterality Date  . ABDOMINAL HYSTERECTOMY  1995  . BREAST SURGERY     Biopsy negative  . CARDIAC CATHETERIZATION  05/31/2007   no signficant CAD, low normal EF (Dr. Gerrie Nordmann)   . CATARACT EXTRACTION W/PHACO Right 03/02/2015   Procedure: CATARACT EXTRACTION PHACO AND INTRAOCULAR LENS PLACEMENT RIGHT EYE CDE=5.78;  Surgeon: [name], MD;  Location: [facility];  Service: Ophthalmology;  Laterality: Right;  . CATARACT EXTRACTION W/PHACO Left 03/19/2015   Procedure: CATARACT EXTRACTION PHACO AND INTRAOCULAR LENS PLACEMENT LEFT EYE CDE=5.44;  Surgeon: [name], MD;  Location: [facility];  Service: Ophthalmology;  Laterality: Left;  . TRANSTHORACIC ECHOCARDIOGRAM  2005   EF 60% with borderline conc LVH; mild TR, RVSP 20mmHg   . TUBAL LIGATION  1977     OB History    Gravida  2   Para  2   Term      Preterm      AB      Living        SAB      TAB      Ectopic      Multiple      Live Births               Home Medications    Prior to Admission medications   Medication Sig Start Date End Date Taking? Authorizing Provider  aspirin 81 MG tablet Take 81 mg by mouth every evening.    Yes [provider]  azelastine (ASTELIN) 137 MCG/SPRAY nasal spray Place 1 spray into the nose at bedtime. Use in each nostril as directed    Yes [provider]  bacitracin-polymyxin b (POLYSPORIN) ointment Apply 1 application topically daily. Apply to thumb for cyst daily   Yes [provider]  cholecalciferol (VITAMIN D) 1000 units tablet Take 2,000 Units by mouth daily.   Yes [provider]  Cinnamon 500 MG capsule Take 500 mg by mouth daily.   Yes [provider]  cycloSPORINE (RESTASIS) 0.05 % ophthalmic emulsion Place 1 drop into both eyes 2 (two) times daily.    Yes [provider]  desonide (DESOWEN) 0.05 % cream Apply topically 2 (two) times daily.   Yes [provider]  FLAX OIL-FISH OIL-BORAGE OIL PO Take 1 capsule by mouth daily.    Yes [provider]  fluticasone (FLONASE) 50 MCG/ACT  nasal spray Place 1 spray into both nostrils every morning.  04/25/13  Yes [provider]  nitroGLYCERIN (NITROLINGUAL) 0.4 MG/SPRAY spray Place 1 spray under the tongue every 5 (five) minutes x 3 doses as needed for chest pain. 08/25/15  Yes Hilty, Nadean Corwin, MD  rosuvastatin (CRESTOR) 5 MG tablet Take 5 mg by mouth every evening.    Yes [provider]  telmisartan-hydrochlorothiazide (MICARDIS HCT) 80-12.5 MG per tablet Take 1 tablet by mouth daily. 07/08/13  Yes Hilty, Nadean Corwin, MD  calcium citrate-vitamin D (CITRACAL+D) 315-200 MG-UNIT per tablet Take 1 tablet by mouth 2 (two) times daily.    [provider]  dexlansoprazole (DEXILANT) 60 MG capsule Take 60 mg by mouth daily.    [provider]  pantoprazole (PROTONIX) 20 MG tablet Take 1 tablet (20 mg total) by mouth daily. 01/28/18   Dorie Rank, MD  predniSONE (DELTASONE) 2.5 MG tablet TAKE 1 TABLET EVERY DAY FOR ARTHRITIS 08/10/17   [provider]  sucralfate (CARAFATE) 1 g tablet Take 1 tablet (1 g total) by mouth 4 (four) times daily -  with meals and at bedtime. 01/28/18   Dorie Rank, MD    Family History Family History  Problem Relation Age of Onset  . Hypertension Mother   . Sudden death Mother   . Heart disease Father   . Kidney failure Father   . Cancer Sister        uterine  . Heart failure Brother        DM, HTN  . Aneurysm Brother        abdominal; also HTN  . Hypertension Brother        sudden death  . Hypertension Brother   . Hyperlipidemia Brother        also DM  . Hypertension Brother   . Other Brother        2 brain surgeries, bleeding    Social History Social History   Tobacco Use  . Smoking status: Never Smoker  . Smokeless tobacco: Never Used  Substance Use Topics  . Alcohol use: No  . Drug use: No     Allergies   Hydrocodone; Zocor [simvastatin]; Latex; and Penicillins   Review of Systems Review of Systems  All other systems reviewed and are  negative.    Physical Exam Updated Vital Signs BP 118/61   Pulse (!) 51   Temp 97.6 F (36.4 C) (Oral)   Resp 16   Ht 1.676 m (5\' 6" )   Wt 82.6 kg (182 lb)   SpO2 97%   BMI 29.38 kg/m  Physical Exam  Constitutional: She appears well-developed and well-nourished. No distress.  HENT:  Head: Normocephalic and atraumatic.  Right Ear: External ear normal.  Left Ear: External ear normal.  Eyes: Conjunctivae are normal. Right eye exhibits no discharge. Left eye exhibits no discharge. No scleral icterus.  Neck: Neck supple. No tracheal deviation present.  Cardiovascular: Normal rate, regular rhythm and intact distal pulses.  Pulmonary/Chest: Effort normal and breath sounds normal. No stridor. No respiratory distress. She has no wheezes. She has no rales.  Abdominal: Soft. Bowel sounds are normal. She exhibits no distension. There is tenderness (mild upper abdomen). There is no rebound and no guarding.  Musculoskeletal: She exhibits no edema or tenderness.  Neurological: She is alert. She has normal strength. No cranial nerve deficit (no facial droop, extraocular movements intact, no slurred speech) or sensory deficit. She exhibits normal muscle tone. She displays no seizure activity. Coordination normal.  Skin: Skin is warm and dry. No rash noted.  Psychiatric: She has a normal mood and affect.  Nursing note and vitals reviewed.    ED Treatments / Results  Labs (all labs ordered are listed, but only abnormal results are displayed) Labs Reviewed  D-DIMER, QUANTITATIVE (NOT AT [facility]) - Abnormal; Notable for the following components:      Result Value   D-Dimer, Quant 0.57 (*)    All other components within normal limits  COMPREHENSIVE METABOLIC PANEL - Abnormal; Notable for the following components:   Potassium 3.3 (*)    Glucose, Bld 104 (*)    GFR calc non Af Amer 58 (*)    All other components within normal limits  CBC  TROPONIN I  TROPONIN I  LIPASE, BLOOD    EKG EKG  Interpretation  Date/Time:  Sunday January 28 2018 07:58:50 EDT Ventricular Rate:  67 PR Interval:    QRS Duration: 105 QT Interval:  440 QTC Calculation: 465 R Axis:   -33 Text Interpretation:  Sinus rhythm Left ventricular hypertrophy Baseline wander in lead(s) V3 No significant change since last tracing Confirmed by Dorie Rank [phone]) on 01/28/2018 8:19:48 AM   Radiology Dg Chest Portable 1 View  Result Date: 01/28/2018 CLINICAL DATA:  Chest pain EXAM: PORTABLE CHEST 1 VIEW COMPARISON:  03/25/2015 chest radiograph. FINDINGS: Stable cardiomediastinal silhouette with normal heart size. No pneumothorax. No pleural effusion. Lungs appear clear, with no acute consolidative airspace disease and no pulmonary edema. IMPRESSION: No active disease. Electronically Signed   By: Ilona Sorrel M.D.   On: 01/28/2018 08:50    Procedures Procedures (including critical care time)  Medications Ordered in ED Medications  aspirin chewable tablet 324 mg (324 mg Oral Given 01/28/18 0846)  gi cocktail (Maalox,Lidocaine,Donnatal) (30 mLs Oral Given 01/28/18 0845)     Initial Impression / Assessment and Plan / ED Course  I have reviewed the triage vital signs and the nursing notes.  Pertinent labs & imaging results that were available during my care of the patient were reviewed by me and considered in my medical decision making (see chart for details).  Clinical Course as of Jan 28 1238  Nancy Fetter Jan 28, 2018  1058 D dimer elevated but within age adjusted normal ranges.  Initial labs, trop normal   [JK]  1215 Heart score 4 primarily based on known CD and Age.   [JK]    Clinical Course User Index [JK] Dorie Rank, MD    Patient presented to the emergency room for evaluation of chest discomfort associated with deseeding acid reflux symptoms.  Patient states she does have a history of GERD.  She has not been on any medications recently because of some side effects.  Patient then did have sharp pain that brought her  to the emergency room today.  Patient's ED work-up is reassuring.  Delta troponins are negative.  She is not tachycardic or tachypneic.  I doubt PE or dissection.  Patient does have a history of coronary artery disease and based on her age and that factors she is at moderate risk per heart score.  However, the patient's symptoms are more suggestive of reflux.  I think in the setting of her reassuring ED evaluation is reasonable to try her on antacid medications.  Patient understands to return for any worsening symptoms and to follow-up with her doctor closely this week  Final Clinical Impressions(s) / ED Diagnoses   Final diagnoses:  Chest pain, unspecified type  Gastroesophageal reflux disease, esophagitis presence not specified    ED Discharge Orders        Ordered    pantoprazole (PROTONIX) 20 MG tablet  Daily     01/28/18 1239    sucralfate (CARAFATE) 1 g tablet  3 times daily with meals & bedtime     01/28/18 1239       Dorie Rank, MD 01/28/18 1241

## 2018-01-28 NOTE — Discharge Instructions (Signed)
Take the medications as prescribed for acid reflux, follow-up with your primary care doctor and considering seeing your cardiologist, did for worsening symptoms

## 2018-08-03 ENCOUNTER — Ambulatory Visit (HOSPITAL_COMMUNITY)
Admission: RE | Admit: 2018-08-03 | Discharge: 2018-08-03 | Disposition: A | Discharge status: Home / Self Care | Payer: Medicare Other | Source: Ambulatory Visit | Attending: Family Medicine | Admitting: Family Medicine

## 2018-08-03 ENCOUNTER — Other Ambulatory Visit (HOSPITAL_COMMUNITY): Payer: Self-pay | Admitting: Family Medicine

## 2018-08-03 DIAGNOSIS — R52 Pain, unspecified: Secondary | ICD-10-CM

## 2018-08-03 DIAGNOSIS — W19XXXD Unspecified fall, subsequent encounter: Secondary | ICD-10-CM | POA: Diagnosis not present

## 2018-08-14 ENCOUNTER — Ambulatory Visit (HOSPITAL_COMMUNITY): Payer: Medicare Other | Attending: Family Medicine | Admitting: Specialist

## 2018-08-14 ENCOUNTER — Encounter (HOSPITAL_COMMUNITY): Payer: Self-pay | Admitting: Specialist

## 2018-08-14 DIAGNOSIS — M25511 Pain in right shoulder: Secondary | ICD-10-CM

## 2018-08-14 DIAGNOSIS — R29898 Other symptoms and signs involving the musculoskeletal system: Secondary | ICD-10-CM | POA: Insufficient documentation

## 2018-08-14 DIAGNOSIS — M25611 Stiffness of right shoulder, not elsewhere classified: Secondary | ICD-10-CM | POA: Diagnosis present

## 2018-08-14 DIAGNOSIS — M542 Cervicalgia: Secondary | ICD-10-CM | POA: Diagnosis not present

## 2018-08-14 NOTE — Therapy (Signed)
[place], [place], [address] Phone: [phone]   Fax:  [phone]  Occupational Therapy Evaluation  Patient Details  Name: Nicole Meadows MRN: [id number] Date of Birth: 1940-09-10 Referring Provider (OT): Dr. Lucianne Lei   Encounter Date: 08/14/2018  OT End of Session - 08/14/18 1510    Visit Number  1    Number of Visits  12    Date for OT Re-Evaluation  09/25/18   mini reassess on 09/04/18   Authorization Type  [employer] Medicare no visit limits     OT Start Time  1118    OT Stop Time  1200    OT Time Calculation (min)  42 min    Activity Tolerance  Patient tolerated treatment well    Behavior During Therapy  [facility] for tasks assessed/performed       Past Medical History:  Diagnosis Date  . Anemia   . Arthritis   . Coronary artery disease    moderate, by cath  . Dyslipidemia   . Fibroid   . Glaucoma 09/25/2017   patient denies having this or being diagnosed with this ever  . History of nuclear stress test 05/2010   bruce myoview; small fixed distal anteroapical, apical defect (breast attenuation or apical thinning), no reversible ischemia, post-stress EF 83%, abnormal but low risk study   . Hyperlipidemia   . Hypertension   . Tremor   . Vaginal dryness     Past Surgical History:  Procedure Laterality Date  . ABDOMINAL HYSTERECTOMY  1995  . BREAST SURGERY     Biopsy negative  . CARDIAC CATHETERIZATION  05/31/2007   no signficant CAD, low normal EF (Dr. Gerrie Nordmann)   . CATARACT EXTRACTION W/PHACO Right 03/02/2015   Procedure: CATARACT EXTRACTION PHACO AND INTRAOCULAR LENS PLACEMENT RIGHT EYE CDE=5.78;  Surgeon: [name], MD;  Location: [facility];  Service: Ophthalmology;  Laterality: Right;  . CATARACT EXTRACTION W/PHACO Left 03/19/2015   Procedure: CATARACT EXTRACTION PHACO AND INTRAOCULAR LENS PLACEMENT LEFT EYE CDE=5.44;  Surgeon: [name], MD;  Location: [facility];  Service: Ophthalmology;  Laterality:  Left;  . TRANSTHORACIC ECHOCARDIOGRAM  2005   EF 60% with borderline conc LVH; mild TR, RVSP 85mmHg   . TUBAL LIGATION  1977    There were no vitals filed for this visit.  Subjective Assessment - 08/14/18 1434    Subjective   S:  This pain is so bad.  I really want to get rid of it.     Pertinent History  Nicole Meadows states that she fell out of her shower, landing on her right shoulder region on 07/15/18.  She experienced some discomfort, however, the pain became much worse on 07/31/2018.  She consulted with her physician and had an xray which was negative for any broken bones or soft tissue changes.  She has been referred to occuapational therapy for evaluation and treatment.      Patient Stated Goals  I want to use my arm again and have no pain.      Currently in Pain?  Yes    Pain Score  6     Pain Location  Shoulder    Pain Orientation  Right    Pain Descriptors / Indicators  Aching    Pain Type  Acute pain    Pain Radiating Towards  begins in cervical region, to shoulder, upper arm and sometimes digits     Pain Frequency  Constant  Aggravating Factors   constant, lying down is worse than sitting up    Pain Relieving Factors  nothing is relieving    Effect of Pain on Daily Activities  moderate         [facility] OT Assessment - 08/14/18 0001      Assessment   Medical Diagnosis  Right Cervical and Shoulder Pain    Referring Provider (OT)  Dr. Lucianne Lei    Onset Date/Surgical Date  07/15/18    Hand Dominance  Right    Next MD Visit  unknown    Prior Therapy  none      Precautions   Precautions  None      Restrictions   Weight Bearing Restrictions  No      Balance Screen   Has the patient fallen in the past 6 months  Yes    How many times?  1    Has the patient had a decrease in activity level because of a fear of falling?   No    Is the patient reluctant to leave their home because of a fear of falling?   No      Home  Environment   Family/patient expects to be  discharged to:  Private residence    Living Arrangements  Spouse/significant other    Available Help at Discharge  Family    Lives With  Spouse;Family      Prior Function   Level of Independence  Independent    Vocation  Retired    Teaching laboratory technician enjoys staying active, she enjoys reading, walking, puzzles, going to church, gardening, cooking      ADL   ADL comments  Patient is having increased pain and difficulty completing activities that involve reaching overhead, reaching behind her head and behind her back, lying down, lying on her left side, cleaing tasks.        Written Expression   Dominant Hand  Right      Vision - History   Baseline Vision  Wears glasses all the time      Cognition   Overall Cognitive Status  Within Functional Limits for tasks assessed      Observation/Other Assessments   Focus on Therapeutic Outcomes (FOTO)   60/100       Sensation   Light Touch  Appears Intact      Coordination   Gross Motor Movements are Fluid and Coordinated  Yes    Fine Motor Movements are Fluid and Coordinated  Yes      ROM / Strength   AROM / PROM / Strength  AROM;PROM;Strength      Palpation   Palpation comment  moderate fascial restrictions and tenderness in right cervical, scapular region      AROM   Overall AROM Comments  assessed in seated, external and internal rotation with shoulder adducted     AROM Assessment Site  Shoulder;Cervical    Right/Left Shoulder  Right    Right Shoulder Flexion  120 Degrees    Right Shoulder ABduction  130 Degrees    Right Shoulder Internal Rotation  80 Degrees    Right Shoulder External Rotation  70 Degrees    Cervical Flexion  WFL    Cervical Extension  WFL    Cervical - Right Side Bend  35    Cervical - Left Side Bend  45    Cervical - Right Rotation  26    Cervical - Left Rotation  40  PROM   Overall PROM Comments  WFL in supine      Strength   Overall Strength Comments  assessed in seated position, external and  internal rotation with shoulder adducted     Strength Assessment Site  Shoulder    Right/Left Shoulder  Right    Right Shoulder Flexion  4-/5    Right Shoulder ABduction  4-/5    Right Shoulder Internal Rotation  4-/5    Right Shoulder External Rotation  4-/5               OT Treatments/Exercises (OP) - 08/14/18 0001      Manual Therapy   Manual Therapy  Myofascial release;Manual Traction    Manual therapy comments  manual therapy intervention completed seperately from all other interventions this date    Myofascial Release  myofascial release to cervical region, right scapular and shoulder region to decrease fascial restrictions and improve pain free mobility in right shoulder and neck.      Manual Traction  begin next session            OT Education - 08/14/18 1510    Education Details  educated patient on shoulder towel slides, cervical stretches, and cervical A/ROM    Person(s) Educated  Patient    Methods  Explanation;Demonstration;Handout    Comprehension  Verbalized understanding;Returned demonstration       OT Short Term Goals - 08/14/18 1517      OT SHORT TERM GOAL #1   Title  Patient will be educated on a HEP for improved shoulder and cervical range of motion.     Time  6    Period  Weeks    Status  New    Target Date  09/25/18      OT SHORT TERM GOAL #2   Title  Patient will increase right shoulder A/ROM and cervical A/ROM to WNL for improved ability to comb her hair, reach into overhead cabinets, and scan when driving safely.    Time  6    Period  Weeks    Status  New      OT SHORT TERM GOAL #3   Title  Patient will increase right shoulder strength to 5/5 for improved ability to complete vacuuming and other household chores.     Time  6    Period  Weeks    Status  New      OT SHORT TERM GOAL #4   Title  Patient will decrease pain level to 3/10 or better in right shoulder and cervical region, while lying on left side, sitting, and completing  all desired daily tasks.     Time  6    Period  Weeks    Status  New      OT SHORT TERM GOAL #5   Title  Patient will decrease fascial restrictions from moderate to trace in her right shoulder and cervical region in order to improve pain free mobility required to complete desired daily activities.     Time  6    Period  Weeks    Status  New               Plan - 08/14/18 1511    Clinical Impression Statement  A:  Patient is a 78 year old active female with medical history significant for arthritis, back pain, and osteopenia.  Patient reports falling on her right shoulder on 07/15/18.  Since that time her pain level in her neck and right  shoulder/scapular region has been 6/10 consistently and can increase to 8/10 depending on position.  Her shoulder A/ROM and strength, as well as her cervical mobility is affected due to the pain.  She is not able to reach overhead, reach behind her back, completing cleaning activities or all of her desired leisure activities due to the pain she is experiencing.  Patient will benefit from continued skilled OT intervention to decrease pain and fascial restrictions and improve pain free mobilty in her neck and right shoulder region.      Occupational Profile and client history currently impacting functional performance  active, motivated, highly independent     Occupational performance deficits (Please refer to evaluation for details):  ADL's;IADL's;Rest and Sleep;Leisure;Social Participation    Rehab Potential  Excellent    Current Impairments/barriers affecting progress:  age, history of arthritis    OT Frequency  2x / week    OT Duration  6 weeks    OT Treatment/Interventions  Self-care/ADL training;Cryotherapy;Traction;Therapeutic exercise;DME and/or AE instruction;Manual Therapy;Neuromuscular education;Ultrasound;Electrical Stimulation;Moist Heat;Energy conservation;Passive range of motion;Therapeutic activities;Patient/family education    Plan  P:  Skilled OT intervention to decrease pain and restrictions which are causing limitations in a/rom and strength in right arm and neck, and limitations in ADL and social participation.  Next session, review HEP, begin manual cervical traction, complete cervical stertches and shoulder stretches, progress as tolerated.     Clinical Decision Making  Limited treatment options, no task modification necessary    OT Home Exercise Plan  shoulder and neck stretches, neck a/rom        Patient will benefit from skilled therapeutic intervention in order to improve the following deficits and impairments:  Increased muscle spasms, Pain, Decreased range of motion, Decreased strength, Increased fascial restrictions, Impaired UE functional use  Visit Diagnosis: Cervicalgia - Plan: Ot plan of care cert/re-cert  Acute pain of right shoulder - Plan: Ot plan of care cert/re-cert  Stiffness of right shoulder, not elsewhere classified - Plan: Ot plan of care cert/re-cert  Other symptoms and signs involving the musculoskeletal system - Plan: Ot plan of care cert/re-cert    Problem List Patient Active Problem List   Diagnosis Date Noted  . Coronary artery calcification 08/29/2016  . Dyspnea 08/29/2016  . Mixed hyperlipidemia 08/29/2016  . Coronary artery disease involving native coronary artery of native heart with angina pectoris ([place]) 08/29/2016  . Obesity (BMI 30-39.9) 08/25/2015  . Essential hypertension 07/08/2013  . Dyslipidemia 07/08/2013  . Tremor     Vangie Bicker, [place], OTR/L [phone]  08/14/2018, 4:11 PM  [facility] [address] [place], [place], [address] Phone: [phone]   Fax:  [phone]  Name: AMANADA PHILBRICK MRN: [id number] Date of Birth: [birth year]

## 2018-08-14 NOTE — Patient Instructions (Signed)
  AROM: Lateral Neck Flexion   Slowly tilt head toward one shoulder, then the other. Hold each position ____ seconds. Repeat ____ times per set. Do ____ sets per session. Do ____ sessions per day.  http://orth.exer.us/296   Copyright  VHI. All rights reserved.  AROM: Neck Extension   Bend head backward. Hold ____ seconds. Repeat ____ times per set. Do ____ sets per session. Do ____ sessions per day.  http://orth.exer.us/300   Copyright  VHI. All rights reserved.  AROM: Neck Flexion   Bend head forward. Hold ____ seconds. Repeat ____ times per set. Do ____ sets per session. Do ____ sessions per day.  http://orth.exer.us/298   Copyright  VHI. All rights reserved.  AROM: Neck Rotation   Turn head slowly to look over one shoulder, then the other. Hold each position ____ seconds. Repeat ____ times per set. Do ____ sets per session. Do ____ sessions per day.  http://orth.exer.us/294   Copyright  VHI. All rights reserved.  Scalene Stretch, Sitting  Repeat __10_ times per session. Do _1-2__ sessions per day.  Copyright  VHI. All rights reserved.  Scalene Stretch, Sitting   Sit, one hand tucked under hip on side to be stretched, other hand over top of head. Gently pull head to side and backwards. Hold ___ seconds. For more stretch, lean body in direction of head pull. Repeat ___ times per session. Do ___ sessions per day.  Copyright  VHI. All rights reserved.  Side Bend, Standing   Stand, one hand grasping other arm above wrist behind body. Pull down while gently tilting head toward pulling side. Hold ___ seconds. Repeat ___ times per session. Do ___ sessions per day.  Copyright  VHI. All rights reserved.  Levator Scapula Stretch, Sitting   Sit, one hand on same-side shoulder blade, other hand on head. Gently pull head down and away. Hold ___ seconds. Repeat ___ times per session. Do ___ sessions per day.  Copyright  VHI. All rights reserved.  Lower  Cervical / Upper Thoracic Stretch   Clasp hands together in front with arms extended. Gently pull shoulder blades apart and bend head forward. Hold ____ seconds. Repeat ____ times per set. Do ____ sets per session. Do ____ sessions per day.  http://orth.exer.us/354   Copyright  VHI. All rights reserved.  Axial Extension (Chin Tuck)   Pull chin in and lengthen back of neck. Hold ____ seconds while counting out loud. Repeat ____ times. Do ____ sessions per day.  http://gt2.exer.us/449   Copyright  VHI. All rights reserved.  SHOULDER: Flexion On Table   Place hands on towel placed on table, elbows straight. Lean forward with you upper body, pushing towel away from body. Hold ___ seconds. ___ reps per set, ___ sets per day  Abduction (Passive)   With arm out to side, resting on towel placed on table, keeping trunk away from table, lean to the side while pushing towel away from body. Hold ____ seconds. Repeat ____ times. Do ____ sessions per day.  Copyright  VHI. All rights reserved.     Internal Rotation (Assistive)   Seated with elbow bent at right angle and held against side, slide arm on table surface in an inward arc keeping elbow anchored in place. Repeat ____ times. Do ____ sessions per day. Activity: Use this motion to brush crumbs off the table.  Copyright  VHI. All rights reserved.

## 2018-08-21 ENCOUNTER — Encounter (HOSPITAL_COMMUNITY): Payer: Medicare Other

## 2018-08-22 ENCOUNTER — Encounter (HOSPITAL_COMMUNITY): Payer: Self-pay

## 2018-08-22 ENCOUNTER — Ambulatory Visit (HOSPITAL_COMMUNITY): Payer: Medicare Other

## 2018-08-22 DIAGNOSIS — M542 Cervicalgia: Secondary | ICD-10-CM

## 2018-08-22 DIAGNOSIS — R29898 Other symptoms and signs involving the musculoskeletal system: Secondary | ICD-10-CM

## 2018-08-22 DIAGNOSIS — M25511 Pain in right shoulder: Secondary | ICD-10-CM

## 2018-08-22 DIAGNOSIS — M25611 Stiffness of right shoulder, not elsewhere classified: Secondary | ICD-10-CM

## 2018-08-22 NOTE — Therapy (Signed)
[place], [place], [address] Phone: [phone]   Fax:  [phone]  Occupational Therapy Treatment  Patient Details  Name: Nicole Meadows MRN: [id number] Date of Birth: Dec 01, 1940 Referring Provider (OT): Dr. Lucianne Lei   Encounter Date: 08/22/2018  OT End of Session - 08/22/18 1130    Visit Number  2    Number of Visits  12    Date for OT Re-Evaluation  09/25/18   mini reassess on 09/04/18   Authorization Type  [employer] Medicare no visit limits     OT Start Time  1035    OT Stop Time  1115    OT Time Calculation (min)  40 min    Activity Tolerance  Patient tolerated treatment well    Behavior During Therapy  [facility] for tasks assessed/performed       Past Medical History:  Diagnosis Date  . Anemia   . Arthritis   . Coronary artery disease    moderate, by cath  . Dyslipidemia   . Fibroid   . Glaucoma 09/25/2017   patient denies having this or being diagnosed with this ever  . History of nuclear stress test 05/2010   bruce myoview; small fixed distal anteroapical, apical defect (breast attenuation or apical thinning), no reversible ischemia, post-stress EF 83%, abnormal but low risk study   . Hyperlipidemia   . Hypertension   . Tremor   . Vaginal dryness     Past Surgical History:  Procedure Laterality Date  . ABDOMINAL HYSTERECTOMY  1995  . BREAST SURGERY     Biopsy negative  . CARDIAC CATHETERIZATION  05/31/2007   no signficant CAD, low normal EF (Dr. Gerrie Nordmann)   . CATARACT EXTRACTION W/PHACO Right 03/02/2015   Procedure: CATARACT EXTRACTION PHACO AND INTRAOCULAR LENS PLACEMENT RIGHT EYE CDE=5.78;  Surgeon: [name], MD;  Location: [facility];  Service: Ophthalmology;  Laterality: Right;  . CATARACT EXTRACTION W/PHACO Left 03/19/2015   Procedure: CATARACT EXTRACTION PHACO AND INTRAOCULAR LENS PLACEMENT LEFT EYE CDE=5.44;  Surgeon: [name], MD;  Location: [facility];  Service: Ophthalmology;  Laterality:  Left;  . TRANSTHORACIC ECHOCARDIOGRAM  2005   EF 60% with borderline conc LVH; mild TR, RVSP 59mmHg   . TUBAL LIGATION  1977    There were no vitals filed for this visit.  Subjective Assessment - 08/22/18 1053    Subjective   S: I can tell the pain is getting better. It's not running down my arm.     Currently in Pain?  Yes    Pain Score  5     Pain Location  Shoulder    Pain Orientation  Right    Pain Descriptors / Indicators  Aching    Pain Type  Acute pain    Pain Radiating Towards  N/A    Pain Onset  In the past 7 days    Pain Frequency  Constant    Aggravating Factors   Increasing use    Pain Relieving Factors  ice and heat    Effect of Pain on Daily Activities  min     Multiple Pain Sites  No         [facility] OT Assessment - 08/22/18 1056      Assessment   Medical Diagnosis  Right Cervical and Shoulder Pain      Precautions   Precautions  None               OT  Treatments/Exercises (OP) - 08/22/18 1056      Exercises   Exercises  Shoulder      Shoulder Exercises: Supine   Protraction  PROM;5 reps;AROM;10 reps    Horizontal ABduction  PROM;5 reps;AROM;10 reps    External Rotation  PROM;5 reps;AROM;10 reps    Internal Rotation  PROM;5 reps;AROM;10 reps    Flexion  PROM;5 reps;AROM;10 reps    ABduction  PROM;5 reps;AROM;10 reps      Shoulder Exercises: Standing   Protraction  AROM;10 reps    Horizontal ABduction  AROM;10 reps    External Rotation  AROM;10 reps    Internal Rotation  AROM;10 reps    Flexion  AROM;10 reps    ABduction  AROM;10 reps      Shoulder Exercises: ROM/Strengthening   X to V Arms  10X    Proximal Shoulder Strengthening, Supine  10X; A/ROM    Other ROM/Strengthening Exercises  Y arm lift off; 5X      Manual Therapy   Manual Therapy  Myofascial release    Manual therapy comments  manual therapy intervention completed seperately from all other interventions this date    Myofascial Release  myofascial release to cervical  region, right scapular and shoulder region to decrease fascial restrictions and improve pain free mobility in right shoulder and neck.               OT Education - 08/22/18 1101    Education Details  educated patient on therapy goals. Updated shoulder exercises to A/ROM standing. patient may stop table slides. Continue with cervical exercises.     Person(s) Educated  Patient    Methods  Explanation;Demonstration;Handout;Verbal cues    Comprehension  Verbalized understanding;Returned demonstration       OT Short Term Goals - 08/22/18 1055      OT SHORT TERM GOAL #1   Title  Patient will be educated on a HEP for improved shoulder and cervical range of motion.     Time  6    Period  Weeks    Status  On-going      OT SHORT TERM GOAL #2   Title  Patient will increase right shoulder A/ROM and cervical A/ROM to WNL for improved ability to comb her hair, reach into overhead cabinets, and scan when driving safely.    Time  6    Period  Weeks    Status  On-going      OT SHORT TERM GOAL #3   Title  Patient will increase right shoulder strength to 5/5 for improved ability to complete vacuuming and other household chores.     Time  6    Period  Weeks    Status  On-going      OT SHORT TERM GOAL #4   Title  Patient will decrease pain level to 3/10 or better in right shoulder and cervical region, while lying on left side, sitting, and completing all desired daily tasks.     Time  6    Period  Weeks    Status  On-going      OT SHORT TERM GOAL #5   Title  Patient will decrease fascial restrictions from moderate to trace in her right shoulder and cervical region in order to improve pain free mobility required to complete desired daily activities.     Time  6    Period  Weeks    Status  On-going  Plan - 08/22/18 1131    Clinical Impression Statement  A: Initiated myofascial release, manual stretching, A/ROM shoulder exerciess, reviewed HEP and updated shoulder  exercises. VC were needed for form and technique. Patient's ROM during A/ROM is close to LUE.    Plan  P: Follow up on updated HEP. Add overhead lacing and UBE bike.     Consulted and Agree with Plan of Care  Patient       Patient will benefit from skilled therapeutic intervention in order to improve the following deficits and impairments:  Increased muscle spasms, Pain, Decreased range of motion, Decreased strength, Increased fascial restrictions, Impaired UE functional use  Visit Diagnosis: Cervicalgia  Acute pain of right shoulder  Stiffness of right shoulder, not elsewhere classified  Other symptoms and signs involving the musculoskeletal system    Problem List Patient Active Problem List   Diagnosis Date Noted  . Coronary artery calcification 08/29/2016  . Dyspnea 08/29/2016  . Mixed hyperlipidemia 08/29/2016  . Coronary artery disease involving native coronary artery of native heart with angina pectoris ([place]) 08/29/2016  . Obesity (BMI 30-39.9) 08/25/2015  . Essential hypertension 07/08/2013  . Dyslipidemia 07/08/2013  . Tremor    Ailene Ravel, OTR/L,CBIS  [phone]  08/22/2018, 11:34 AM  [facility] [address] [place], [place], [address] Phone: [phone]   Fax:  [phone]  Name: FARAH LEPAK MRN: [id number] Date of Birth: [birth year]

## 2018-08-22 NOTE — Patient Instructions (Signed)
Repeat all exercises 10-15 times, 1-2 times per day.  1) Shoulder Protraction    Begin with elbows by your side, slowly "punch" straight out in front of you.      2) Shoulder Flexion   Standing:         Begin with arms at your side with thumbs pointed up, slowly raise both arms up and forward towards overhead.      3) Horizontal abduction/adduction   Standing:           Begin with arms straight out in front of you, bring out to the side in at "T" shape. Keep arms straight entire time.        4) Internal & External Rotation    *No band* -Stand with elbows at the side and elbows bent 90 degrees. Move your forearms away from your body, then bring back inward toward the body.     5) Shoulder Abduction  Supine:     Standing:       Begin with your arms flat on the table next to your side. Slowly move your arms out to the side so that they go overhead, in a jumping jack or snow angel movement.    6) X to V arms (cheerleader move):  Begin with arms straight down, crossed in front of body in an "X". Keeping arms crossed, lift arms straight up overhead. Then spread arms apart into a "V" shape.  Bring back together into x and lower down to starting position.

## 2018-08-23 ENCOUNTER — Ambulatory Visit (HOSPITAL_COMMUNITY): Payer: Medicare Other | Admitting: Occupational Therapy

## 2018-08-23 ENCOUNTER — Encounter (HOSPITAL_COMMUNITY): Payer: Self-pay | Admitting: Occupational Therapy

## 2018-08-23 DIAGNOSIS — R29898 Other symptoms and signs involving the musculoskeletal system: Secondary | ICD-10-CM

## 2018-08-23 DIAGNOSIS — M542 Cervicalgia: Secondary | ICD-10-CM | POA: Diagnosis not present

## 2018-08-23 DIAGNOSIS — M25511 Pain in right shoulder: Secondary | ICD-10-CM

## 2018-08-23 DIAGNOSIS — M25611 Stiffness of right shoulder, not elsewhere classified: Secondary | ICD-10-CM

## 2018-08-23 NOTE — Therapy (Signed)
[place], [place], [address] Phone: [phone]   Fax:  [phone]  Occupational Therapy Treatment  Patient Details  Name: Nicole Meadows MRN: [id number] Date of Birth: 10-Aug-1940 Referring Provider (OT): Dr. Lucianne Lei   Encounter Date: 08/23/2018  OT End of Session - 08/23/18 0940    Visit Number  3    Number of Visits  12    Date for OT Re-Evaluation  09/25/18   mini reassess on 09/04/18   Authorization Type  [employer] Medicare no visit limits     OT Start Time  0900    OT Stop Time  0941    OT Time Calculation (min)  41 min    Activity Tolerance  Patient tolerated treatment well    Behavior During Therapy  [facility] for tasks assessed/performed       Past Medical History:  Diagnosis Date  . Anemia   . Arthritis   . Coronary artery disease    moderate, by cath  . Dyslipidemia   . Fibroid   . Glaucoma 09/25/2017   patient denies having this or being diagnosed with this ever  . History of nuclear stress test 05/2010   bruce myoview; small fixed distal anteroapical, apical defect (breast attenuation or apical thinning), no reversible ischemia, post-stress EF 83%, abnormal but low risk study   . Hyperlipidemia   . Hypertension   . Tremor   . Vaginal dryness     Past Surgical History:  Procedure Laterality Date  . ABDOMINAL HYSTERECTOMY  1995  . BREAST SURGERY     Biopsy negative  . CARDIAC CATHETERIZATION  05/31/2007   no signficant CAD, low normal EF (Dr. Gerrie Nordmann)   . CATARACT EXTRACTION W/PHACO Right 03/02/2015   Procedure: CATARACT EXTRACTION PHACO AND INTRAOCULAR LENS PLACEMENT RIGHT EYE CDE=5.78;  Surgeon: [name], MD;  Location: [facility];  Service: Ophthalmology;  Laterality: Right;  . CATARACT EXTRACTION W/PHACO Left 03/19/2015   Procedure: CATARACT EXTRACTION PHACO AND INTRAOCULAR LENS PLACEMENT LEFT EYE CDE=5.44;  Surgeon: [name], MD;  Location: [facility];  Service: Ophthalmology;  Laterality:  Left;  . TRANSTHORACIC ECHOCARDIOGRAM  2005   EF 60% with borderline conc LVH; mild TR, RVSP 14mmHg   . TUBAL LIGATION  1977    There were no vitals filed for this visit.  Subjective Assessment - 08/23/18 0859    Subjective   S: It's not in my neck as much now.     Currently in Pain?  Yes    Pain Score  4     Pain Location  Shoulder    Pain Orientation  Right    Pain Descriptors / Indicators  Aching;Sore    Pain Type  Acute pain    Pain Radiating Towards  N/A    Pain Onset  In the past 7 days    Pain Frequency  Constant    Aggravating Factors   use, increased movement    Pain Relieving Factors  ice, heat    Effect of Pain on Daily Activities  min effect on ADLs    Multiple Pain Sites  No         [facility] OT Assessment - 08/23/18 0859      Assessment   Medical Diagnosis  Right Cervical and Shoulder Pain      Precautions   Precautions  None               OT Treatments/Exercises (OP) - 08/23/18  0859      Exercises   Exercises  Shoulder      Shoulder Exercises: Supine   Protraction  PROM;5 reps;AROM;10 reps    Horizontal ABduction  PROM;5 reps;AROM;10 reps    External Rotation  PROM;5 reps;AROM;10 reps    Internal Rotation  PROM;5 reps;AROM;10 reps    Flexion  PROM;5 reps;AROM;10 reps    ABduction  PROM;5 reps;AROM;10 reps      Shoulder Exercises: Standing   Protraction  AROM;10 reps    Horizontal ABduction  AROM;10 reps    External Rotation  AROM;10 reps    Internal Rotation  AROM;10 reps    Flexion  AROM;10 reps    ABduction  AROM;10 reps    Extension  Theraband;10 reps    Theraband Level (Shoulder Extension)  Level 2 (Red)    Row  Theraband;10 reps    Theraband Level (Shoulder Row)  Level 2 (Red)    Retraction  Theraband;10 reps    Theraband Level (Shoulder Retraction)  Level 2 (Red)      Shoulder Exercises: ROM/Strengthening   UBE (Upper Arm Bike)  Level 1 2' forward 2' reverse    Over Head Lace  1' standing    X to V Arms  10X    Proximal  Shoulder Strengthening, Supine  10X; A/ROM, no rest breaks    Proximal Shoulder Strengthening, Seated  10X A/ROM, no rest breaks    Other ROM/Strengthening Exercises  Y arm lift off; 10X      Manual Therapy   Manual Therapy  Myofascial release    Manual therapy comments  manual therapy intervention completed seperately from all other interventions this date    Myofascial Release  myofascial release to cervical region, right scapular and shoulder region to decrease fascial restrictions and improve pain free mobility in right shoulder and neck.                 OT Short Term Goals - 08/22/18 1055      OT SHORT TERM GOAL #1   Title  Patient will be educated on a HEP for improved shoulder and cervical range of motion.     Time  6    Period  Weeks    Status  On-going      OT SHORT TERM GOAL #2   Title  Patient will increase right shoulder A/ROM and cervical A/ROM to WNL for improved ability to comb her hair, reach into overhead cabinets, and scan when driving safely.    Time  6    Period  Weeks    Status  On-going      OT SHORT TERM GOAL #3   Title  Patient will increase right shoulder strength to 5/5 for improved ability to complete vacuuming and other household chores.     Time  6    Period  Weeks    Status  On-going      OT SHORT TERM GOAL #4   Title  Patient will decrease pain level to 3/10 or better in right shoulder and cervical region, while lying on left side, sitting, and completing all desired daily tasks.     Time  6    Period  Weeks    Status  On-going      OT SHORT TERM GOAL #5   Title  Patient will decrease fascial restrictions from moderate to trace in her right shoulder and cervical region in order to improve pain free mobility required to complete desired daily activities.  Time  6    Period  Weeks    Status  On-going               Plan - 08/23/18 1941    Clinical Impression Statement  A: Continued with manual therapy to address fascial  restriction in RUE and trapezius regions. Continued with A/ROM adding proximal shoulder strengthening in standing and increasing Y lift off repetitions to 10. Added scapular theraband, overhead lacing, and UBE this session. Pt requiring mod tactile cuing for correct form during scapular theraband. Verbal cuing for correct completion and form during exercises.     Plan  P: Continue working on scapular theraband and correct form. Attempt ball on wall for scapular strengthening       Patient will benefit from skilled therapeutic intervention in order to improve the following deficits and impairments:  Increased muscle spasms, Pain, Decreased range of motion, Decreased strength, Increased fascial restrictions, Impaired UE functional use  Visit Diagnosis: Cervicalgia  Acute pain of right shoulder  Stiffness of right shoulder, not elsewhere classified  Other symptoms and signs involving the musculoskeletal system    Problem List Patient Active Problem List   Diagnosis Date Noted  . Coronary artery calcification 08/29/2016  . Dyspnea 08/29/2016  . Mixed hyperlipidemia 08/29/2016  . Coronary artery disease involving native coronary artery of native heart with angina pectoris ([place]) 08/29/2016  . Obesity (BMI 30-39.9) 08/25/2015  . Essential hypertension 07/08/2013  . Dyslipidemia 07/08/2013  . Tremor    Guadelupe Sabin, OTR/L  [phone] 08/23/2018, 9:41 AM  [place] [address] [place], [place], [address] Phone: [phone]   Fax:  [phone]  Name: Nicole Meadows MRN: [id number] Date of Birth: 02-Jan-1941

## 2018-08-28 ENCOUNTER — Ambulatory Visit (HOSPITAL_COMMUNITY): Payer: Medicare Other | Attending: Family Medicine

## 2018-08-28 ENCOUNTER — Encounter (HOSPITAL_COMMUNITY): Payer: Self-pay

## 2018-08-28 DIAGNOSIS — M542 Cervicalgia: Secondary | ICD-10-CM | POA: Diagnosis present

## 2018-08-28 DIAGNOSIS — R29898 Other symptoms and signs involving the musculoskeletal system: Secondary | ICD-10-CM | POA: Insufficient documentation

## 2018-08-28 DIAGNOSIS — M25511 Pain in right shoulder: Secondary | ICD-10-CM | POA: Insufficient documentation

## 2018-08-28 DIAGNOSIS — M25611 Stiffness of right shoulder, not elsewhere classified: Secondary | ICD-10-CM | POA: Insufficient documentation

## 2018-08-28 NOTE — Therapy (Signed)
[place], [place], [address] Phone: [phone]   Fax:  [phone]  Occupational Therapy Treatment  Patient Details  Name: Nicole Meadows MRN: [id number] Date of Birth: [birth year] Referring Provider (OT): Dr. Lucianne Lei   Encounter Date: 08/28/2018  OT End of Session - 08/28/18 1024    Visit Number  4    Number of Visits  12    Date for OT Re-Evaluation  09/25/18   mini reassess on 09/04/18   Authorization Type  [employer] Medicare no visit limits     OT Start Time  0820    OT Stop Time  0900    OT Time Calculation (min)  40 min    Activity Tolerance  Patient tolerated treatment well    Behavior During Therapy  [facility] for tasks assessed/performed       Past Medical History:  Diagnosis Date  . Anemia   . Arthritis   . Coronary artery disease    moderate, by cath  . Dyslipidemia   . Fibroid   . Glaucoma 09/25/2017   patient denies having this or being diagnosed with this ever  . History of nuclear stress test 05/2010   bruce myoview; small fixed distal anteroapical, apical defect (breast attenuation or apical thinning), no reversible ischemia, post-stress EF 83%, abnormal but low risk study   . Hyperlipidemia   . Hypertension   . Tremor   . Vaginal dryness     Past Surgical History:  Procedure Laterality Date  . ABDOMINAL HYSTERECTOMY  1995  . BREAST SURGERY     Biopsy negative  . CARDIAC CATHETERIZATION  05/31/2007   no signficant CAD, low normal EF (Dr. Gerrie Nordmann)   . CATARACT EXTRACTION W/PHACO Right 03/02/2015   Procedure: CATARACT EXTRACTION PHACO AND INTRAOCULAR LENS PLACEMENT RIGHT EYE CDE=5.78;  Surgeon: [name], MD;  Location: [facility];  Service: Ophthalmology;  Laterality: Right;  . CATARACT EXTRACTION W/PHACO Left 03/19/2015   Procedure: CATARACT EXTRACTION PHACO AND INTRAOCULAR LENS PLACEMENT LEFT EYE CDE=5.44;  Surgeon: [name], MD;  Location: [facility];  Service: Ophthalmology;  Laterality:  Left;  . TRANSTHORACIC ECHOCARDIOGRAM  2005   EF 60% with borderline conc LVH; mild TR, RVSP 46mmHg   . TUBAL LIGATION  1977    There were no vitals filed for this visit.  Subjective Assessment - 08/28/18 0834    Subjective   S: It's definitely starting to feel better.     Currently in Pain?  Yes    Pain Score  4     Pain Location  Shoulder    Pain Orientation  Right    Pain Descriptors / Indicators  Aching;Sore    Pain Type  Acute pain                   OT Treatments/Exercises (OP) - 08/28/18 0837      Exercises   Exercises  Shoulder      Shoulder Exercises: Supine   Protraction  PROM;5 reps;AROM;15 reps    Horizontal ABduction  PROM;5 reps;AROM;15 reps    External Rotation  PROM;5 reps;AROM;15 reps    Internal Rotation  PROM;5 reps;AROM;15 reps    Flexion  PROM;5 reps;AROM;15 reps    ABduction  PROM;5 reps;AROM;15 reps      Shoulder Exercises: Standing   Protraction  AROM;15 reps    Horizontal ABduction  AROM;15 reps    External Rotation  AROM;15 reps    Internal Rotation  AROM;15 reps    Flexion  AROM;15 reps    ABduction  AROM;15 reps    Extension  Theraband;10 reps    Theraband Level (Shoulder Extension)  Level 2 (Red)    Row  Theraband;10 reps    Theraband Level (Shoulder Row)  Level 2 (Red)    Retraction  Theraband;10 reps    Theraband Level (Shoulder Retraction)  Level 2 (Red)      Shoulder Exercises: ROM/Strengthening   X to V Arms  12X    Proximal Shoulder Strengthening, Supine  15X; A/ROM, no rest breaks    Proximal Shoulder Strengthening, Seated  15X, A/ROM, no rest breaks    Ball on Wall  1' flexion 1' abduction green ball    Other ROM/Strengthening Exercises  Y arm lift off; 10X      Manual Therapy   Manual Therapy  Myofascial release    Manual therapy comments  manual therapy intervention completed seperately from all other interventions this date               OT Short Term Goals - 08/22/18 1055      OT SHORT TERM GOAL  #1   Title  Patient will be educated on a HEP for improved shoulder and cervical range of motion.     Time  6    Period  Weeks    Status  On-going      OT SHORT TERM GOAL #2   Title  Patient will increase right shoulder A/ROM and cervical A/ROM to WNL for improved ability to comb her hair, reach into overhead cabinets, and scan when driving safely.    Time  6    Period  Weeks    Status  On-going      OT SHORT TERM GOAL #3   Title  Patient will increase right shoulder strength to 5/5 for improved ability to complete vacuuming and other household chores.     Time  6    Period  Weeks    Status  On-going      OT SHORT TERM GOAL #4   Title  Patient will decrease pain level to 3/10 or better in right shoulder and cervical region, while lying on left side, sitting, and completing all desired daily tasks.     Time  6    Period  Weeks    Status  On-going      OT SHORT TERM GOAL #5   Title  Patient will decrease fascial restrictions from moderate to trace in her right shoulder and cervical region in order to improve pain free mobility required to complete desired daily activities.     Time  6    Period  Weeks    Status  On-going               Plan - 08/28/18 1024    Clinical Impression Statement  A: Patient with less fascial restrictions this session in right lateral cervical region. Minimal fascial restrictions noted in right anterior and medial deltoid region with manual techniques completed to address. Added ball on the wall to focus on scapular strength and stability. VC  for form and during scapular theraband still needed.     Plan  P: Continue working towards increasing independence with scapular strengthening exercises. Once form and technique is correct provide for HEP.    Consulted and Agree with Plan of Care  Patient       Patient will benefit from skilled therapeutic intervention in order  to improve the following deficits and impairments:  Increased muscle spasms,  Pain, Decreased range of motion, Decreased strength, Increased fascial restrictions, Impaired UE functional use  Visit Diagnosis: Acute pain of right shoulder  Cervicalgia  Stiffness of right shoulder, not elsewhere classified  Other symptoms and signs involving the musculoskeletal system    Problem List Patient Active Problem List   Diagnosis Date Noted  . Coronary artery calcification 08/29/2016  . Dyspnea 08/29/2016  . Mixed hyperlipidemia 08/29/2016  . Coronary artery disease involving native coronary artery of native heart with angina pectoris ([place]) 08/29/2016  . Obesity (BMI 30-39.9) 08/25/2015  . Essential hypertension 07/08/2013  . Dyslipidemia 07/08/2013  . Tremor    Ailene Ravel, OTR/L,CBIS  [phone]  08/28/2018, 10:35 AM  [place] [address] [place], [place], [address] Phone: [phone]   Fax:  [phone]  Name: Nicole Meadows MRN: [id number] Date of Birth: March 29, 1941

## 2018-08-29 ENCOUNTER — Ambulatory Visit (HOSPITAL_COMMUNITY): Payer: Medicare Other | Admitting: Occupational Therapy

## 2018-08-29 ENCOUNTER — Encounter (HOSPITAL_COMMUNITY): Payer: Self-pay | Admitting: Occupational Therapy

## 2018-08-29 DIAGNOSIS — M25511 Pain in right shoulder: Secondary | ICD-10-CM

## 2018-08-29 DIAGNOSIS — M25611 Stiffness of right shoulder, not elsewhere classified: Secondary | ICD-10-CM

## 2018-08-29 DIAGNOSIS — R29898 Other symptoms and signs involving the musculoskeletal system: Secondary | ICD-10-CM

## 2018-08-29 DIAGNOSIS — M542 Cervicalgia: Secondary | ICD-10-CM

## 2018-08-29 NOTE — Therapy (Signed)
[place], [place], [address] Phone: [phone]   Fax:  [phone]  Occupational Therapy Treatment  Patient Details  Name: Nicole Meadows MRN: [id number] Date of Birth: 04/24/1941 Referring Provider (OT): Dr. Lucianne Lei   Encounter Date: 08/29/2018  OT End of Session - 08/29/18 0943    Visit Number  5    Number of Visits  12    Date for OT Re-Evaluation  09/25/18   mini reassess on 09/04/18   Authorization Type  [employer] Medicare no visit limits     OT Start Time  0901    OT Stop Time  0942    OT Time Calculation (min)  41 min    Activity Tolerance  Patient tolerated treatment well    Behavior During Therapy  [facility] for tasks assessed/performed       Past Medical History:  Diagnosis Date  . Anemia   . Arthritis   . Coronary artery disease    moderate, by cath  . Dyslipidemia   . Fibroid   . Glaucoma 09/25/2017   patient denies having this or being diagnosed with this ever  . History of nuclear stress test 05/2010   bruce myoview; small fixed distal anteroapical, apical defect (breast attenuation or apical thinning), no reversible ischemia, post-stress EF 83%, abnormal but low risk study   . Hyperlipidemia   . Hypertension   . Tremor   . Vaginal dryness     Past Surgical History:  Procedure Laterality Date  . ABDOMINAL HYSTERECTOMY  1995  . BREAST SURGERY     Biopsy negative  . CARDIAC CATHETERIZATION  05/31/2007   no signficant CAD, low normal EF (Dr. Gerrie Nordmann)   . CATARACT EXTRACTION W/PHACO Right 03/02/2015   Procedure: CATARACT EXTRACTION PHACO AND INTRAOCULAR LENS PLACEMENT RIGHT EYE CDE=5.78;  Surgeon: [name], MD;  Location: [facility];  Service: Ophthalmology;  Laterality: Right;  . CATARACT EXTRACTION W/PHACO Left 03/19/2015   Procedure: CATARACT EXTRACTION PHACO AND INTRAOCULAR LENS PLACEMENT LEFT EYE CDE=5.44;  Surgeon: [name], MD;  Location: [facility];  Service: Ophthalmology;  Laterality:  Left;  . TRANSTHORACIC ECHOCARDIOGRAM  2005   EF 60% with borderline conc LVH; mild TR, RVSP 88mmHg   . TUBAL LIGATION  1977    There were no vitals filed for this visit.  Subjective Assessment - 08/29/18 0902    Subjective   S: It's about the same as it was yesterday.     Currently in Pain?  Yes    Pain Score  4     Pain Location  Shoulder    Pain Orientation  Right    Pain Descriptors / Indicators  Sore    Pain Type  Acute pain    Pain Radiating Towards  N/A    Pain Onset  In the past 7 days    Pain Frequency  Constant    Aggravating Factors   use, increased movement    Pain Relieving Factors  ice, heat    Effect of Pain on Daily Activities  min effect on ADLs    Multiple Pain Sites  No         [facility] OT Assessment - 08/29/18 0902      Assessment   Medical Diagnosis  Right Cervical and Shoulder Pain      Precautions   Precautions  None               OT Treatments/Exercises (OP) - 08/29/18  0903      Exercises   Exercises  Shoulder      Shoulder Exercises: Supine   Protraction  PROM;5 reps;AROM;15 reps    Horizontal ABduction  PROM;5 reps;AROM;15 reps    External Rotation  PROM;5 reps;AROM;15 reps    Internal Rotation  PROM;5 reps;AROM;15 reps    Flexion  PROM;5 reps;AROM;15 reps    ABduction  PROM;5 reps;AROM;15 reps      Shoulder Exercises: Standing   Protraction  AROM;15 reps    Horizontal ABduction  AROM;15 reps    External Rotation  AROM;15 reps    Internal Rotation  AROM;15 reps    Flexion  AROM;15 reps    ABduction  AROM;15 reps    Extension  Theraband;15 reps    Theraband Level (Shoulder Extension)  Level 2 (Red)    Row  Theraband;15 reps    Theraband Level (Shoulder Row)  Level 2 (Red)    Retraction  Theraband;15 reps    Theraband Level (Shoulder Retraction)  Level 2 (Red)      Shoulder Exercises: ROM/Strengthening   UBE (Upper Arm Bike)  Level 1 3' forward 3' reverse    Over Head Lace  1'30" standing    X to V Arms  12X    Proximal  Shoulder Strengthening, Supine  15X; A/ROM, no rest breaks    Proximal Shoulder Strengthening, Seated  15X, A/ROM, no rest breaks    Ball on Wall  1' flexion 1' abduction green ball      Manual Therapy   Manual Therapy  Myofascial release    Manual therapy comments  manual therapy intervention completed seperately from all other interventions this date    Myofascial Release  myofascial release to cervical region, right scapular and shoulder region to decrease fascial restrictions and improve pain free mobility in right shoulder and neck.                 OT Short Term Goals - 08/22/18 1055      OT SHORT TERM GOAL #1   Title  Patient will be educated on a HEP for improved shoulder and cervical range of motion.     Time  6    Period  Weeks    Status  On-going      OT SHORT TERM GOAL #2   Title  Patient will increase right shoulder A/ROM and cervical A/ROM to WNL for improved ability to comb her hair, reach into overhead cabinets, and scan when driving safely.    Time  6    Period  Weeks    Status  On-going      OT SHORT TERM GOAL #3   Title  Patient will increase right shoulder strength to 5/5 for improved ability to complete vacuuming and other household chores.     Time  6    Period  Weeks    Status  On-going      OT SHORT TERM GOAL #4   Title  Patient will decrease pain level to 3/10 or better in right shoulder and cervical region, while lying on left side, sitting, and completing all desired daily tasks.     Time  6    Period  Weeks    Status  On-going      OT SHORT TERM GOAL #5   Title  Patient will decrease fascial restrictions from moderate to trace in her right shoulder and cervical region in order to improve pain free mobility required to complete desired daily  activities.     Time  6    Period  Weeks    Status  On-going               Plan - 08/29/18 3435    Clinical Impression Statement  A: Continued with manual therapy to address minimal fascial  restrictions in deltoid and trapezius regions. Continued with A/ROM and scapular strengthening, pt is improving in form and independence with scapular theraband. Resumed overhead lacing increasing time. Verbal cuing for form and technique.     Plan  P: Update HEP for scapular theraband, add red loop band for scapular strengthening and stability       Patient will benefit from skilled therapeutic intervention in order to improve the following deficits and impairments:  Increased muscle spasms, Pain, Decreased range of motion, Decreased strength, Increased fascial restrictions, Impaired UE functional use  Visit Diagnosis: Acute pain of right shoulder  Cervicalgia  Stiffness of right shoulder, not elsewhere classified  Other symptoms and signs involving the musculoskeletal system    Problem List Patient Active Problem List   Diagnosis Date Noted  . Coronary artery calcification 08/29/2016  . Dyspnea 08/29/2016  . Mixed hyperlipidemia 08/29/2016  . Coronary artery disease involving native coronary artery of native heart with angina pectoris ([place]) 08/29/2016  . Obesity (BMI 30-39.9) 08/25/2015  . Essential hypertension 07/08/2013  . Dyslipidemia 07/08/2013  . Tremor    Guadelupe Sabin, OTR/L  [phone] 08/29/2018, 9:44 AM  [place] [address] [place], [place], [address] Phone: [phone]   Fax:  [phone]  Name: Nicole Meadows MRN: [id number] Date of Birth: 08-09-1940

## 2018-09-03 ENCOUNTER — Telehealth (HOSPITAL_COMMUNITY): Payer: Self-pay | Admitting: Family Medicine

## 2018-09-03 NOTE — Telephone Encounter (Signed)
pt called to cx said that she goes with her husband to his heart check up appt and that is scheduled for tomorrow  09/03/18

## 2018-09-04 ENCOUNTER — Encounter (HOSPITAL_COMMUNITY): Payer: Medicare Other

## 2018-09-06 ENCOUNTER — Ambulatory Visit (HOSPITAL_COMMUNITY): Payer: Medicare Other

## 2018-09-06 DIAGNOSIS — M25511 Pain in right shoulder: Secondary | ICD-10-CM

## 2018-09-06 DIAGNOSIS — M542 Cervicalgia: Secondary | ICD-10-CM

## 2018-09-06 DIAGNOSIS — M25611 Stiffness of right shoulder, not elsewhere classified: Secondary | ICD-10-CM

## 2018-09-06 DIAGNOSIS — R29898 Other symptoms and signs involving the musculoskeletal system: Secondary | ICD-10-CM

## 2018-09-06 NOTE — Therapy (Signed)
[place], [place], [address] Phone: [phone]   Fax:  [phone]  Occupational Therapy Treatment  Patient Details  Name: Nicole Meadows MRN: [id number] Date of Birth: [birth year] Referring Provider (OT): Dr. Lucianne Lei   Encounter Date: 09/06/2018  OT End of Session - 09/06/18 1107    Visit Number  6    Number of Visits  12    Date for OT Re-Evaluation  09/25/18   mini reassess on 09/04/18   Authorization Type  [employer] Medicare no visit limits     OT Start Time  0950   reassess and discharge   OT Stop Time  1015    OT Time Calculation (min)  25 min    Activity Tolerance  Patient tolerated treatment well    Behavior During Therapy  [facility] for tasks assessed/performed       Past Medical History:  Diagnosis Date  . Anemia   . Arthritis   . Coronary artery disease    moderate, by cath  . Dyslipidemia   . Fibroid   . Glaucoma 09/25/2017   patient denies having this or being diagnosed with this ever  . History of nuclear stress test 05/2010   bruce myoview; small fixed distal anteroapical, apical defect (breast attenuation or apical thinning), no reversible ischemia, post-stress EF 83%, abnormal but low risk study   . Hyperlipidemia   . Hypertension   . Tremor   . Vaginal dryness     Past Surgical History:  Procedure Laterality Date  . ABDOMINAL HYSTERECTOMY  1995  . BREAST SURGERY     Biopsy negative  . CARDIAC CATHETERIZATION  05/31/2007   no signficant CAD, low normal EF (Dr. Gerrie Nordmann)   . CATARACT EXTRACTION W/PHACO Right 03/02/2015   Procedure: CATARACT EXTRACTION PHACO AND INTRAOCULAR LENS PLACEMENT RIGHT EYE CDE=5.78;  Surgeon: [name], MD;  Location: [facility];  Service: Ophthalmology;  Laterality: Right;  . CATARACT EXTRACTION W/PHACO Left 03/19/2015   Procedure: CATARACT EXTRACTION PHACO AND INTRAOCULAR LENS PLACEMENT LEFT EYE CDE=5.44;  Surgeon: [name], MD;  Location: [facility];  Service:  Ophthalmology;  Laterality: Left;  . TRANSTHORACIC ECHOCARDIOGRAM  2005   EF 60% with borderline conc LVH; mild TR, RVSP 86mHg   . TUBAL LIGATION  1977    There were no vitals filed for this visit.  Subjective Assessment - 09/06/18 1105    Subjective   S: I can't believe how much better it feels.    Currently in Pain?  Yes    Pain Score  3     Pain Location  Shoulder    Pain Orientation  Right    Pain Descriptors / Indicators  Sore    Pain Type  Acute pain    Pain Radiating Towards  N/A    Pain Onset  In the past 7 days    Pain Frequency  Constant    Aggravating Factors   use, increased movement    Pain Relieving Factors  ice, heat    Effect of Pain on Daily Activities  no effect    Multiple Pain Sites  No         [facility] OT Assessment - 09/06/18 0952      Assessment   Medical Diagnosis  Right Cervical and Shoulder Pain    Referring Provider (OT)  Dr. VLucianne Lei     Precautions   Precautions  None      Prior Function  Level of Independence  Independent      AROM   Overall AROM Comments  assessed in seated, external and internal rotation with shoulder adducted     AROM Assessment Site  Shoulder    Right/Left Shoulder  Right    Right Shoulder Flexion  140 Degrees   previous: 120   Right Shoulder ABduction  145 Degrees   previous: 130   Right Shoulder Internal Rotation  90 Degrees   previous: 80   Right Shoulder External Rotation  90 Degrees   previous: 70   Cervical - Right Side Bend  60   previous: 35   Cervical - Left Side Bend  60   previous: 45   Cervical - Right Rotation  80   previous: 26   Cervical - Left Rotation  80   previous: 40     Strength   Overall Strength Comments  assessed in seated position, external and internal rotation with shoulder adducted     Strength Assessment Site  Shoulder    Right/Left Shoulder  Right    Right Shoulder Flexion  5/5   previous: 4-/5   Right Shoulder ABduction  5/5   previous: 4-/5   Right Shoulder  Internal Rotation  5/5   previous: 4-/5   Right Shoulder External Rotation  5/5   previous: 4-/5              OT Treatments/Exercises (OP) - 09/06/18 1106      Exercises   Exercises  Shoulder      Shoulder Exercises: Supine   Protraction  PROM;5 reps    Horizontal ABduction  PROM;5 reps    External Rotation  PROM;5 reps    Internal Rotation  PROM;5 reps    Flexion  PROM;5 reps    ABduction  PROM;5 reps      Shoulder Exercises: Standing   Extension  Theraband;15 reps    Theraband Level (Shoulder Extension)  Level 2 (Red)    Row  Theraband;15 reps    Theraband Level (Shoulder Row)  Level 2 (Red)    Retraction  Theraband;15 reps    Theraband Level (Shoulder Retraction)  Level 2 (Red)      Manual Therapy   Manual Therapy  Myofascial release    Manual therapy comments  manual therapy intervention completed seperately from all other interventions this date    Myofascial Release  myofascial release to cervical region, right scapular and shoulder region to decrease fascial restrictions and improve pain free mobility in right shoulder and neck.               OT Education - 09/06/18 1105    Education Details  red band scapular strengthening. Y arm lift off. Reviewed goals and therapy progress.     Person(s) Educated  Patient    Methods  Explanation;Demonstration;Verbal cues;Handout    Comprehension  Returned demonstration;Verbalized understanding       OT Short Term Goals - 09/06/18 1005      OT SHORT TERM GOAL #1   Title  Patient will be educated on a HEP for improved shoulder and cervical range of motion.     Time  6    Period  Weeks    Status  Achieved      OT SHORT TERM GOAL #2   Title  Patient will increase right shoulder A/ROM and cervical A/ROM to WNL for improved ability to comb her hair, reach into overhead cabinets, and scan when driving safely.  Time  6    Period  Weeks    Status  Achieved      OT SHORT TERM GOAL #3   Title  Patient will  increase right shoulder strength to 5/5 for improved ability to complete vacuuming and other household chores.     Time  6    Period  Weeks    Status  Achieved      OT SHORT TERM GOAL #4   Title  Patient will decrease pain level to 3/10 or better in right shoulder and cervical region, while lying on left side, sitting, and completing all desired daily tasks.     Time  6    Period  Weeks    Status  Achieved      OT SHORT TERM GOAL #5   Title  Patient will decrease fascial restrictions from moderate to trace in her right shoulder and cervical region in order to improve pain free mobility required to complete desired daily activities.     Time  6    Period  Weeks    Status  Achieved               Plan - 09/06/18 1107    Clinical Impression Statement  A: reassessment completed this date. patient has met all therapy goals. Cervical and shoulder ROM is WNL and strength is 5/5 for her right shoulder. Patient reports mild pain at times, of 3/10 or less. She is returning to completing daily tasks using her right arm as her dominant with reports of it becoming natural now. She voices no concerns with discharging from therapy services this date and is able to continue with a HEP independently at home.     Plan  P: D/C from OT services with HEP. Follow up at needed.     Consulted and Agree with Plan of Care  Patient       Patient will benefit from skilled therapeutic intervention in order to improve the following deficits and impairments:  Increased muscle spasms, Pain, Decreased range of motion, Decreased strength, Increased fascial restrictions, Impaired UE functional use  Visit Diagnosis: Acute pain of right shoulder  Cervicalgia  Stiffness of right shoulder, not elsewhere classified  Other symptoms and signs involving the musculoskeletal system    Problem List Patient Active Problem List   Diagnosis Date Noted  . Coronary artery calcification 08/29/2016  . Dyspnea  08/29/2016  . Mixed hyperlipidemia 08/29/2016  . Coronary artery disease involving native coronary artery of native heart with angina pectoris ([place]) 08/29/2016  . Obesity (BMI 30-39.9) 08/25/2015  . Essential hypertension 07/08/2013  . Dyslipidemia 07/08/2013  . Tremor    Ailene Ravel, OTR/L,CBIS  [phone]  09/06/2018, 11:10 AM  [facility] [address] [place], [place], [address] Phone: [phone]   Fax:  [phone]  Name: XAVIERA FLATEN MRN: [id number] Date of Birth: [birth year]

## 2018-09-06 NOTE — Patient Instructions (Signed)
(  Home) Extension: Isometric / Bilateral Arm Retraction - Sitting   Facing anchor, hold hands and elbow at shoulder height, with elbow bent.  Pull arms back to squeeze shoulder blades together. Repeat 10-15 times. 1-3 times/day.   (Clinic) Extension / Flexion (Assist)   Face anchor, pull arms back, keeping elbow straight, and squeze shoulder blades together. Repeat 10-15 times. 1-3 times/day.   Copyright  VHI. All rights reserved.   (Home) Retraction: Row - Bilateral (Anchor)   Facing anchor, arms reaching forward, pull hands toward stomach, keeping elbows bent and at your sides and pinching shoulder blades together. Repeat 10-15 times. 1-3 times/day.   Copyright  VHI. All rights reserved.   Wall Slide and lift off  Begin by  standing in front of a wall with your arms bent at 90 degrees. Slide both arms up the wall. Once your arms are fully extended, lift your arms off the wall. Keep your back straight and core tight throughout the movement. 10-15 times.

## 2018-09-07 DIAGNOSIS — N760 Acute vaginitis: Secondary | ICD-10-CM | POA: Insufficient documentation

## 2018-09-07 DIAGNOSIS — M1990 Unspecified osteoarthritis, unspecified site: Secondary | ICD-10-CM | POA: Insufficient documentation

## 2018-09-11 ENCOUNTER — Ambulatory Visit (HOSPITAL_COMMUNITY): Payer: Medicare Other

## 2018-09-13 ENCOUNTER — Ambulatory Visit: Payer: Medicare Other | Admitting: Internal Medicine

## 2018-09-13 ENCOUNTER — Encounter: Payer: Self-pay | Admitting: Internal Medicine

## 2018-09-13 VITALS — BP 118/66 | HR 68 | Ht 65.0 in | Wt 183.2 lb

## 2018-09-13 DIAGNOSIS — I10 Essential (primary) hypertension: Secondary | ICD-10-CM | POA: Diagnosis not present

## 2018-09-13 DIAGNOSIS — I739 Peripheral vascular disease, unspecified: Secondary | ICD-10-CM

## 2018-09-13 DIAGNOSIS — E785 Hyperlipidemia, unspecified: Secondary | ICD-10-CM | POA: Diagnosis not present

## 2018-09-13 DIAGNOSIS — I2584 Coronary atherosclerosis due to calcified coronary lesion: Secondary | ICD-10-CM | POA: Diagnosis not present

## 2018-09-13 DIAGNOSIS — I2510 Atherosclerotic heart disease of native coronary artery without angina pectoris: Secondary | ICD-10-CM

## 2018-09-13 DIAGNOSIS — I779 Disorder of arteries and arterioles, unspecified: Secondary | ICD-10-CM | POA: Insufficient documentation

## 2018-09-13 DIAGNOSIS — I6522 Occlusion and stenosis of left carotid artery: Secondary | ICD-10-CM

## 2018-09-13 NOTE — Patient Instructions (Signed)
Medication Instructions:  NO CHANGE If you need a refill on your cardiac medications before your next appointment, please call your pharmacy.   Lab work: If you have labs (blood work) drawn today and your tests are completely normal, you will receive your results only by: [name] MyChart Message (if you have MyChart) OR . A paper copy in the mail If you have any lab test that is abnormal or we need to change your treatment, we will call you to review the results.  Testing/Procedures: Your physician has requested that you have a carotid duplex. This test is an ultrasound of the carotid arteries in your neck. It looks at blood flow through these arteries that supply the brain with blood. Allow one hour for this exam. There are no restrictions or special instructions.    Follow-Up: At [facility], you and your health needs are our priority.  As part of our continuing mission to provide you with exceptional heart care, we have created designated Provider Care Teams.  These Care Teams include your primary Cardiologist (physician) and Advanced Practice Providers (APPs -  Physician Assistants and Nurse Practitioners) who all work together to provide you with the care you need, when you need it. You will need a follow up appointment in 12 months.  Please call our office 2 months in advance to schedule this appointment.  You may see DR HILTY or one of the following Advanced Practice Providers on your designated Care Team: Almyra Deforest, [place] . Fabian Sharp, PA-C

## 2018-09-13 NOTE — Progress Notes (Signed)
OFFICE NOTE  Chief Complaint:  No complaints  Primary Care Physician: Lucianne Lei, MD  HPI:  Nicole Meadows  is a pleasant 78 year old female with history of moderate coronary disease by cath on medical therapy, also problems with up-and-down weight and essential tremor. She has dyslipidemia which has been fairly well controlled and hypertension which is also at goal. Her only other concern is that really she's had some left breast pain. The symptom are somewhat toothache-like, and seemed to been improved with topical corticosteroids. She saw dermatologist who prescribed this medicine. She denies any worsening shortness of breath with exertion but has had several pound weight gain and knows that she needs to work on this. She's been taking care of her husband who recently had knee replacement.  I saw [name] back today for follow-up. She denies any chest pain or worsening shortness of breath. She's had good blood pressure control. Her weight seems pretty stable. She has a essential tremor which is unchanged.  Nicole Meadows returns today for follow-up. Over the past year she's had no new complaints. She did have an episode in the summer of upper mid back pain. He was thought to be orthopedic and she went to the hospital but ruled out and underwent testing which was negative. She's had no further episodes. Blood pressure is well-controlled. Her cholesterol is at goal per primary care provider.  08/29/2016  Nicole Meadows reports a recent episode of chest pressure. She felt tightness and burning across her chest and between her shoulder blades. She was hot. She thought it was reflux however she's been on Protonix for a long time. An EKG was performed her primary care doctor's office which showed no ischemia and she said a "heart" blood test was done, I suspect a troponin which was reportedly negative. She did have a history of moderate coronary artery disease by cardiac catheterization in the  late 2000's and had a negative nuclear stress test in November 2011. She's not had any coronary risk assessment since then. In 2016 she had CT angiography of the chest which demonstrated coronary artery calcification.  09/06/2017  Nicole Meadows returns today for follow-up.  Overall she seems to be doing well.  She has been working with rehabilitation and Bobette Mo in order to get stronger.  She denies any chest pain or new reflux symptoms.  EKG shows normal sinus rhythm today.  Her blood pressure is well controlled.  09/13/2018  Nicole Meadows is seen today in follow-up.  Over the past year she is done well.  She has managed to lose about 5 pounds.  She denies any chest pain or shortness of breath.  She tells me that she did have some [employer] screening tests performed a year ago which showed moderate left carotid artery stenosis.  She has not had carotid artery testing here however she was noted to have prior moderate coronary artery disease by cath in the past.  We manage this medically.  She is followed in neurology for tremor and has no history of stroke or TIA.  Blood pressures well controlled today.  Her last lipid profile was in December 2019 which showed total cholesterol 142, HDL 68, LDL 57 triglyceride 89.  PMHx:  Past Medical History:  Diagnosis Date  . Anemia   . Arthritis   . Coronary artery disease    moderate, by cath  . Dyslipidemia   . Fibroid   . Glaucoma 09/25/2017   patient denies having this or being  diagnosed with this ever  . History of nuclear stress test 05/2010   bruce myoview; small fixed distal anteroapical, apical defect (breast attenuation or apical thinning), no reversible ischemia, post-stress EF 83%, abnormal but low risk study   . Hyperlipidemia   . Hypertension   . Tremor   . Vaginal dryness     Past Surgical History:  Procedure Laterality Date  . ABDOMINAL HYSTERECTOMY  1995  . BREAST SURGERY     Biopsy negative  . CARDIAC CATHETERIZATION  05/31/2007    no signficant CAD, low normal EF (Dr. Gerrie Nordmann)   . CATARACT EXTRACTION W/PHACO Right 03/02/2015   Procedure: CATARACT EXTRACTION PHACO AND INTRAOCULAR LENS PLACEMENT RIGHT EYE CDE=5.78;  Surgeon: [name], MD;  Location: [facility];  Service: Ophthalmology;  Laterality: Right;  . CATARACT EXTRACTION W/PHACO Left 03/19/2015   Procedure: CATARACT EXTRACTION PHACO AND INTRAOCULAR LENS PLACEMENT LEFT EYE CDE=5.44;  Surgeon: [name], MD;  Location: [facility];  Service: Ophthalmology;  Laterality: Left;  . TRANSTHORACIC ECHOCARDIOGRAM  2005   EF 60% with borderline conc LVH; mild TR, RVSP 60mmHg   . TUBAL LIGATION  1977    FAMHx:  Family History  Problem Relation Age of Onset  . Hypertension Mother   . Sudden death Mother   . Heart disease Father   . Kidney failure Father   . Cancer Sister        uterine  . Heart failure Brother        DM, HTN  . Aneurysm Brother        abdominal; also HTN  . Hypertension Brother        sudden death  . Hypertension Brother   . Hyperlipidemia Brother        also DM  . Hypertension Brother   . Other Brother        2 brain surgeries, bleeding    SOCHx:   reports that she has never smoked. She has never used smokeless tobacco. She reports that she does not drink alcohol or use drugs.  ALLERGIES:  Allergies  Allergen Reactions  . Hydrocodone Nausea And Vomiting  . Zocor [Simvastatin]     myalgias  . Latex Rash  . Penicillins Rash    Has patient had a PCN reaction causing immediate rash, facial/tongue/throat swelling, SOB or lightheadedness with hypotension: Unknown Has patient had a PCN reaction causing severe rash involving mucus membranes or skin necrosis: Yes Has patient had a PCN reaction that required hospitalization: No Has patient had a PCN reaction occurring within the last 10 years: No If all of the above answers are "NO", then may proceed with Cephalosporin use.    ROS: Pertinent items noted in HPI and remainder of comprehensive ROS  otherwise negative.  HOME MEDS: Current Outpatient Medications  Medication Sig Dispense Refill  . aspirin 81 MG tablet Take 81 mg by mouth every evening.     [name] azelastine (ASTELIN) 137 MCG/SPRAY nasal spray Place 1 spray into the nose at bedtime. Use in each nostril as directed     . bacitracin-polymyxin b (POLYSPORIN) ointment Apply 1 application topically daily. Apply to thumb for cyst daily    . calcium citrate-vitamin D (CITRACAL+D) 315-200 MG-UNIT per tablet Take 1 tablet by mouth 2 (two) times daily.    . cholecalciferol (VITAMIN D) 1000 units tablet Take 2,000 Units by mouth daily.    . Cinnamon 500 MG capsule Take 500 mg by mouth daily.    . cycloSPORINE (RESTASIS) 0.05 %  ophthalmic emulsion Place 1 drop into both eyes 2 (two) times daily.     [name] desonide (DESOWEN) 0.05 % cream Apply topically 2 (two) times daily.    [name] dexlansoprazole (DEXILANT) 60 MG capsule Take 60 mg by mouth daily.    [name] FLAX OIL-FISH OIL-BORAGE OIL PO Take 1 capsule by mouth daily.     . fluticasone (FLONASE) 50 MCG/ACT nasal spray Place 1 spray into both nostrils every morning.     . nitroGLYCERIN (NITROLINGUAL) 0.4 MG/SPRAY spray Place 1 spray under the tongue every 5 (five) minutes x 3 doses as needed for chest pain. 12 g 2  . pantoprazole (PROTONIX) 20 MG tablet Take 1 tablet (20 mg total) by mouth daily. 14 tablet 0  . predniSONE (DELTASONE) 2.5 MG tablet TAKE 1 TABLET EVERY DAY FOR ARTHRITIS  1  . rosuvastatin (CRESTOR) 5 MG tablet Take 5 mg by mouth every evening.     . sucralfate (CARAFATE) 1 g tablet Take 1 tablet (1 g total) by mouth 4 (four) times daily -  with meals and at bedtime. 28 tablet 0  . telmisartan-hydrochlorothiazide (MICARDIS HCT) 80-12.5 MG per tablet Take 1 tablet by mouth daily. 28 tablet 0   No current facility-administered medications for this visit.     LABS/IMAGING: No results found for this or any previous visit (from the past 48 hour(s)). No results found.  VITALS: BP 118/66    Pulse 68   Ht 5\' 5"  (1.651 m)   Wt 183 lb 3.2 oz (83.1 kg)   BMI 30.49 kg/m   EXAM: General appearance: alert and no distress Neck: no carotid bruit and no JVD Lungs: clear to auscultation bilaterally Heart: regular rate and rhythm, S1, S2 normal, no murmur, click, rub or gallop Abdomen: soft, non-tender; bowel sounds normal; no masses,  no organomegaly Extremities: extremities normal, atraumatic, no cyanosis or edema Pulses: 2+ and symmetric Skin: Skin color, texture, turgor normal. No rashes or lesions Neurologic: Grossly normal psych: Mood, affect normal  EKG: Sinus rhythm 68, minimal voltage criteria for LVH-personally reviewed  ASSESSMENT: 1. Hypertension-controlled 2. Dyslipidemia-at goal 3. Essential tremor 4. Obesity 5. Moderate left carotid artery stenosis by [employer] screening  PLAN: 1.   [name] is doing well and remains asymptomatic.  Blood pressure is controlled and cholesterol is at goal with LDL less than 70.  She has essential tremor managed by neurology.  She tells me she did have a moderate left carotid artery stenosis by [employer] screening in the past.  This is more than a year ago so I would like to get a dedicated scan in our vascular lab to see if that is progressed.  I am pleased that she is managed to lose some more weight and have encouraged her to continue to work on that.  Plan follow-up with me annually or sooner as necessary.  Pixie Casino, MD, [facility], [place] Director of the Advanced Lipid Disorders &  Cardiovascular Risk Reduction Clinic Diplomate of the American Board of Clinical Lipidology Attending Cardiologist  Direct Dial: [phone]  Fax: [phone]  Website:  [email or web address] Sotto 09/13/2018, 11:10 AM

## 2018-09-14 ENCOUNTER — Encounter (HOSPITAL_COMMUNITY): Payer: Medicare Other

## 2018-09-18 ENCOUNTER — Ambulatory Visit (HOSPITAL_COMMUNITY): Payer: Medicare Other

## 2018-09-20 ENCOUNTER — Encounter: Payer: Self-pay | Admitting: Neurology

## 2018-09-20 ENCOUNTER — Other Ambulatory Visit: Payer: Self-pay

## 2018-09-20 ENCOUNTER — Ambulatory Visit: Payer: Medicare Other | Admitting: Neurology

## 2018-09-20 ENCOUNTER — Encounter (HOSPITAL_COMMUNITY): Payer: Medicare Other | Admitting: Occupational Therapy

## 2018-09-20 VITALS — BP 112/66 | HR 76 | Ht 65.5 in | Wt 181.0 lb

## 2018-09-20 DIAGNOSIS — G250 Essential tremor: Secondary | ICD-10-CM

## 2018-09-20 NOTE — Patient Instructions (Signed)
Your tremor is still only in the head and neck area, fairly stable. Please be careful when walking. Stay well hydrated and well rested.  Follow up in one year.

## 2018-09-20 NOTE — Progress Notes (Signed)
Subjective:    Patient ID: Nicole Meadows is a 78 y.o. female.  HPI     Interim history:   Nicole Meadows is a 78 year old right-handed woman with an underlying medical history of allergies, hypertension, reflux disease and obesity, who presents for follow-up consultation of her head tremor. The patient is unaccompanied today and presents for her yearly checkup. I last saw her on 09/18/2017, at which time she felt her tremor was stable. Her tremor was mostly noticeable when she was fatigued. She did have some in from issues with low back pain and joint pain. We mutually agreed to follow her clinically.   Today, 09/20/2018: She reports doing well from the tremor standpoint, feels stable. Unfortunately, she did sustain a fall in December, and hurt her R shoulder, she had PT for this. She had a neck X ray on 08/03/18, which was neg. She just finished PT. She did walk into a wooden step on the R frontal, had a bump and felt sore in the R forehead for about a week, no LOC, no fall then. She also bumped backwards into something in the garage. No significant hand tremor. Had PT for lower back in the past. Started sucralfate for GER. Went to ER in July for CP, but cardiac w/u was neg. Was felt to be GER and was treated symptomatically with GI cocktail and started sucralfate.    The patient's allergies, current medications, family history, past medical history, past social history, past surgical history and problem list were reviewed and updated as appropriate.    Previously:  I first met her on 09/14/2016 at the request of her primary care physician, at which time she reported a long-standing history of tremors. I suggested observation of her overall mild head tremor, and recheck in one year routinely.   09/14/2016: (She) reports a long-standing history of tremors. Some 6 weeks ago she had an exacerbation of her tremor and it affected her whole body at the time. She did have joint pain at the time and  also flareup in her headaches. She has a remote history of migraine headaches but they improved significantly after she had her hysterectomy in 1995. She has occasional snoring, no morning headaches.  She has previously been seen by Dr. Erling Cruz in our office for many years. I reviewed an office note by Dr. Erling Cruz from 11/30/2010, at which time he talk to her about her head tremor and headaches. He had seen her many years ago in 2005 for recurrent headaches. She reported in 2012 that her head tremor was getting worse. She declined symptomatic medication at the time. She also had symptomatic dizziness at the time. I also reviewed an office note by Dr. love from 04/11/2008, at which time he reported that she had a long-standing history of essential tremor for which he saw her in 2005. He also saw her for headaches. He also mentions a prior visit from November 1998 for vertigo and essential tremor. He reported in 2009 that she had never been on Topamax her beta blocker. He suggested trial of Inderal versus Topamax but was hesitant to suggest Inderal because of prior history of pulmonary problems. They mutually agreed to forego treatment at the time. I reviewed blood test results from your office from 08/19/2016. CMP was unremarkable with the exception of calcium borderline at 11. CBC with differential was unremarkable. ESR was normal. Total cholesterol was 139, triglycerides 129, LDL 54. TSH normal. Hemoglobin A1c was 5.7, free T4 and free T3 were  normal. Vitamin D was borderline at 29. She had a brain MRI with and without contrast on 08/22/2003 as well as a brain MRA without contrast on 08/22/2003 which I reviewed: Brain MRA was normal, brain MRI was normal. She had a CTH wo contrast on 10/30/13: normal.  She had a recent nuclear stress test under Dr. Debara Pickett on 08/30/16, and I reviewed the results: Normal EF and no ischemia.  [facility], even [facility] weight, but d/t to her husband's health issues  did not go as regularly as she did not want to leave him alone at home.  She reports a recent incident of whole body trembling and had a few other episodes since then. She has a history of arthritis and had joint pains in both ankles and both knees and was given a tapering course of prednisone for 2 weeks, which started a week after the increase in tremor.  She has since then seen a rheumatologist last week, had a number of X rays, was told she has OA. She may have had a viral ill, her rheumatologist suspected d/t to flare up of joint pain and headaches last month.  Overall, she feels that since the flareup of her tremor she has reverted back to her baseline. She is not particularly bothered by her head tremor and does not report any hand tremor. She has a strong family history of tremors. Her father had hand tremors, she had a total of 9 siblings out of which 6 had some form of tremor including head, voice or hand tremors. She is very active, she is still able to do everything around the house, she drives without problems. She does not drink alcohol regularly, only on special occasions, she does not drink caffeine daily, coffee maybe 3 cups per week. She has 2 grown children. She lives with her husband, son and daughter-in-law. She is retired. She has occasional forgetfulness and mostly difficulty with names but nothing sinister.  Her Past Medical History Is Significant For: Past Medical History:  Diagnosis Date  . Anemia   . Arthritis   . Coronary artery disease    moderate, by cath  . Dyslipidemia   . Fibroid   . Glaucoma 09/25/2017   patient denies having this or being diagnosed with this ever  . History of nuclear stress test 05/2010   bruce myoview; small fixed distal anteroapical, apical defect (breast attenuation or apical thinning), no reversible ischemia, post-stress EF 83%, abnormal but low risk study   . Hyperlipidemia   . Hypertension   . Tremor   . Vaginal dryness     Her Past  Surgical History Is Significant For: Past Surgical History:  Procedure Laterality Date  . ABDOMINAL HYSTERECTOMY  1995  . BREAST SURGERY     Biopsy negative  . CARDIAC CATHETERIZATION  05/31/2007   no signficant CAD, low normal EF (Dr. Gerrie Nordmann)   . CATARACT EXTRACTION W/PHACO Right 03/02/2015   Procedure: CATARACT EXTRACTION PHACO AND INTRAOCULAR LENS PLACEMENT RIGHT EYE CDE=5.78;  Surgeon: [name], MD;  Location: [facility];  Service: Ophthalmology;  Laterality: Right;  . CATARACT EXTRACTION W/PHACO Left 03/19/2015   Procedure: CATARACT EXTRACTION PHACO AND INTRAOCULAR LENS PLACEMENT LEFT EYE CDE=5.44;  Surgeon: [name], MD;  Location: [facility];  Service: Ophthalmology;  Laterality: Left;  . TRANSTHORACIC ECHOCARDIOGRAM  2005   EF 60% with borderline conc LVH; mild TR, RVSP 72mHg   . [place]  Her Family History Is Significant For: Family History  Problem Relation Age of Onset  . Hypertension Mother   . Sudden death Mother   . Heart disease Father   . Kidney failure Father   . Cancer Sister        uterine  . Heart failure Brother        DM, HTN  . Aneurysm Brother        abdominal; also HTN  . Hypertension Brother        sudden death  . Hypertension Brother   . Hyperlipidemia Brother        also DM  . Hypertension Brother   . Other Brother        2 brain surgeries, bleeding    Her Social History Is Significant For: Social History   Socioeconomic History  . Marital status: Married    Spouse name: Not on file  . Number of children: Not on file  . Years of education: Not on file  . Highest education level: Not on file  Occupational History  . Not on file  Social Needs  . Financial resource strain: Not on file  . Food insecurity:    Worry: Not on file    Inability: Not on file  . Transportation needs:    Medical: Not on file    Non-medical: Not on file  Tobacco Use  . Smoking status: Never Smoker  . Smokeless tobacco: Never Used  Substance and  Sexual Activity  . Alcohol use: No  . Drug use: No  . Sexual activity: Not on file    Comment: Hysterectomy  Lifestyle  . Physical activity:    Days per week: Not on file    Minutes per session: Not on file  . Stress: Not on file  Relationships  . Social connections:    Talks on phone: Not on file    Gets together: Not on file    Attends religious service: Not on file    Active member of club or organization: Not on file    Attends meetings of clubs or organizations: Not on file    Relationship status: Not on file  Other Topics Concern  . Not on file  Social History Narrative  . Not on file    Her Allergies Are:  Allergies  Allergen Reactions  . Hydrocodone Nausea And Vomiting  . Zocor [Simvastatin]     myalgias  . Latex Rash  . Penicillins Rash    Has patient had a PCN reaction causing immediate rash, facial/tongue/throat swelling, SOB or lightheadedness with hypotension: Unknown Has patient had a PCN reaction causing severe rash involving mucus membranes or skin necrosis: Yes Has patient had a PCN reaction that required hospitalization: No Has patient had a PCN reaction occurring within the last 10 years: No If all of the above answers are "NO", then may proceed with Cephalosporin use.  :   Her Current Medications Are:  Outpatient Encounter Medications as of 09/20/2018  Medication Sig  . azelastine (ASTELIN) 137 MCG/SPRAY nasal spray Place 1 spray into the nose at bedtime. Use in each nostril as directed   . bacitracin-polymyxin b (POLYSPORIN) ointment Apply 1 application topically daily. Apply to thumb for cyst daily  . calcium citrate-vitamin D (CITRACAL+D) 315-200 MG-UNIT per tablet Take 1 tablet by mouth 2 (two) times daily.  . cholecalciferol (VITAMIN D) 1000 units tablet Take 2,000 Units by mouth daily.  . Cinnamon 500 MG capsule Take 500 mg by mouth daily.  [name]  cycloSPORINE (RESTASIS) 0.05 % ophthalmic emulsion Place 1 drop into both eyes 2 (two) times daily.   [name]  desonide (DESOWEN) 0.05 % cream Apply topically 2 (two) times daily.  [name] FLAX OIL-FISH OIL-BORAGE OIL PO Take 1 capsule by mouth daily.   . fluticasone (FLONASE) 50 MCG/ACT nasal spray Place 1 spray into both nostrils every morning.   . nitroGLYCERIN (NITROLINGUAL) 0.4 MG/SPRAY spray Place 1 spray under the tongue every 5 (five) minutes x 3 doses as needed for chest pain.  . pantoprazole (PROTONIX) 20 MG tablet Take 1 tablet (20 mg total) by mouth daily.  . rosuvastatin (CRESTOR) 5 MG tablet Take 5 mg by mouth every evening.   . sucralfate (CARAFATE) 1 g tablet Take 1 tablet (1 g total) by mouth 4 (four) times daily -  with meals and at bedtime.  [name] telmisartan-hydrochlorothiazide (MICARDIS HCT) 80-12.5 MG per tablet Take 1 tablet by mouth daily.  . [DISCONTINUED] predniSONE (DELTASONE) 2.5 MG tablet TAKE 1 TABLET EVERY DAY FOR ARTHRITIS  . [DISCONTINUED] aspirin 81 MG tablet Take 81 mg by mouth every evening.   . [DISCONTINUED] dexlansoprazole (DEXILANT) 60 MG capsule Take 60 mg by mouth daily.   No facility-administered encounter medications on file as of 09/20/2018.   :  Review of Systems:  Out of a complete 14 point review of systems, all are reviewed and negative with the exception of these symptoms as listed below: Review of Systems  Neurological:       Pt presents today to discuss her tremor. Pt reports that she is doing well. Pt had one fall this year and injured her shoulder/neck and needed PT.    Objective:  Neurological Exam  Physical Exam Physical Examination:   Vitals:   09/20/18 1032  BP: 112/66  Pulse: 76   General Examination: The patient is a very pleasant 78 y.o. female in no acute distress. She appears well-developed and well-nourished and well groomed.   HEENT:Normocephalic, atraumatic, pupils are equal, round and reactive to light and accommodation. Extraocular tracking is good without limitation to gaze excursion or nystagmus noted. Normal smooth pursuit is  noted. Hearing is grossly intact. Face is symmetric with normal facial animation and normal facial sensation. Speech is clear with no dysarthria noted. There is no hypophonia. There is stable and consistent head tremor, slight voice tremor. Neck is supple with full range of passive and active motion. There are no carotid bruits on auscultation. Oropharynx exam reveals:mildmouth dryness, adequatedental hygiene.Tongue protrudes centrally and palate elevates symmetrically.   Chest:Clear to auscultation without wheezing, rhonchi or crackles noted.  Heart:S1+S2+0, regular and normal without murmurs, rubs or gallops noted.   Abdomen:Soft, non-tender and non-distended with normal bowel sounds appreciated on auscultation.  Extremities:There isnopitting edema in the distal lower extremities bilaterally. Pedal pulses are intact.  Skin: Warm and dry without trophic changes noted.  Musculoskeletal: exam reveals no obvious joint deformities, tenderness or joint swelling or erythema, except some arthritis changes in the R more than L hand.   Neurologically:  Mental status: The patient is awake, alert and oriented in all 4 spheres.Herimmediate and remote memory, attention, language skills and fund of knowledge are appropriate. There is no evidence of aphasia, agnosia, apraxia or anomia. Speech is clear with normal prosody and enunciation. Thought process is linear. Mood is normaland affect is normal.  Cranial nerves II - XII are as described above under HEENT exam. In addition: shoulder shrug is normal with equal shoulder height noted. Motor exam: Normal bulk, strength  and tone is noted. There is no drift,restingtremor. Romberg is negative.  (On 09/14/2016: Handwriting is not micrographic, very legible. On Archimedes spiral drawing she has no difficulty with her dominant hand which is the right and minimal trembling noted on the left).   She has no significant postural or action  tremor.Reflexes are 1-2+ throughout, except trace in the ankles. Fine motor skills and coordination: grossly intact for age.  finger taps, normal hand movements, normal rapid alternating patting, normal foot taps and normal foot agility.  Cerebellar testing: No dysmetria or intention tremor. There is no truncal or gait ataxia.  Sensory exam: intact to light touch.  Gait, station and balance:Shestands easily. No veering to one side is noted. No leaning to one side is noted. Posture is age-appropriate and stance is narrow based. Gait showsnormalstride length and normalpace. No problems turning are noted.   Assessmentand Plan:  In summary,Nicole F Andersonis a very pleasant 70 year oldfemalewith an underlying medical history of allergies, hypertension, reflux disease and obesity, whopresents for reevaluation of her head tremor. She has no significant hand tremor. She has had mild progression with time, which is expected, has a fairly strong FHx of ET. She had prior brain scans including head CT and brain MRI and MRA in the past. Exam is stable from my end of things, she is reassured in that regard and she is encouraged to continue to hydrate well, rest well, utilize caffeine in limitation. I suggested a one-year recheck routinely, sooner if needed. I answered all her questions today and she was in agreement.

## 2018-09-25 ENCOUNTER — Encounter (HOSPITAL_COMMUNITY): Payer: Medicare Other | Admitting: Occupational Therapy

## 2018-09-26 ENCOUNTER — Ambulatory Visit (HOSPITAL_COMMUNITY)
Admission: RE | Admit: 2018-09-26 | Discharge: 2018-09-26 | Disposition: A | Discharge status: Home / Self Care | Payer: Medicare Other | Source: Ambulatory Visit | Attending: Cardiovascular Disease | Admitting: Cardiovascular Disease

## 2018-09-26 DIAGNOSIS — I6522 Occlusion and stenosis of left carotid artery: Secondary | ICD-10-CM | POA: Diagnosis not present

## 2018-09-27 ENCOUNTER — Encounter (HOSPITAL_COMMUNITY): Payer: Medicare Other | Admitting: Occupational Therapy

## 2019-07-10 ENCOUNTER — Other Ambulatory Visit: Payer: Self-pay | Admitting: Family Medicine

## 2019-07-10 ENCOUNTER — Other Ambulatory Visit (HOSPITAL_COMMUNITY): Payer: Self-pay | Admitting: Family Medicine

## 2019-07-10 DIAGNOSIS — R1310 Dysphagia, unspecified: Secondary | ICD-10-CM

## 2019-07-22 ENCOUNTER — Other Ambulatory Visit: Payer: Self-pay

## 2019-07-22 ENCOUNTER — Ambulatory Visit (HOSPITAL_COMMUNITY)
Admission: RE | Admit: 2019-07-22 | Discharge: 2019-07-22 | Disposition: A | Discharge status: Home / Self Care | Payer: Medicare Other | Source: Ambulatory Visit | Attending: Family Medicine | Admitting: Family Medicine

## 2019-07-22 DIAGNOSIS — R1310 Dysphagia, unspecified: Secondary | ICD-10-CM | POA: Diagnosis present

## 2019-07-30 ENCOUNTER — Other Ambulatory Visit: Payer: Self-pay

## 2019-07-30 ENCOUNTER — Ambulatory Visit: Payer: Medicare Other | Attending: Internal Medicine

## 2019-07-30 DIAGNOSIS — Z20822 Contact with and (suspected) exposure to covid-19: Secondary | ICD-10-CM

## 2019-08-01 LAB — NOVEL CORONAVIRUS, NAA: SARS-CoV-2, NAA: NOT DETECTED

## 2019-08-16 DIAGNOSIS — K219 Gastro-esophageal reflux disease without esophagitis: Secondary | ICD-10-CM | POA: Diagnosis not present

## 2019-08-16 DIAGNOSIS — M130 Polyarthritis, unspecified: Secondary | ICD-10-CM | POA: Diagnosis not present

## 2019-08-16 DIAGNOSIS — I10 Essential (primary) hypertension: Secondary | ICD-10-CM | POA: Diagnosis not present

## 2019-08-27 DIAGNOSIS — Z0000 Encounter for general adult medical examination without abnormal findings: Secondary | ICD-10-CM | POA: Diagnosis not present

## 2019-09-25 ENCOUNTER — Encounter: Payer: Self-pay | Admitting: Internal Medicine

## 2019-09-25 ENCOUNTER — Ambulatory Visit: Payer: Medicare Other | Admitting: Neurology

## 2019-09-25 ENCOUNTER — Encounter: Payer: Self-pay | Admitting: Neurology

## 2019-09-25 ENCOUNTER — Other Ambulatory Visit: Payer: Self-pay

## 2019-09-25 ENCOUNTER — Ambulatory Visit: Payer: Medicare PPO | Admitting: Internal Medicine

## 2019-09-25 VITALS — BP 116/65 | HR 63 | Temp 97.4°F | Ht 65.5 in | Wt 181.1 lb

## 2019-09-25 VITALS — BP 122/62 | HR 56 | Ht 65.5 in | Wt 180.0 lb

## 2019-09-25 DIAGNOSIS — I10 Essential (primary) hypertension: Secondary | ICD-10-CM

## 2019-09-25 DIAGNOSIS — G250 Essential tremor: Secondary | ICD-10-CM | POA: Diagnosis not present

## 2019-09-25 DIAGNOSIS — I6522 Occlusion and stenosis of left carotid artery: Secondary | ICD-10-CM

## 2019-09-25 DIAGNOSIS — E785 Hyperlipidemia, unspecified: Secondary | ICD-10-CM | POA: Diagnosis not present

## 2019-09-25 DIAGNOSIS — I2584 Coronary atherosclerosis due to calcified coronary lesion: Secondary | ICD-10-CM | POA: Diagnosis not present

## 2019-09-25 DIAGNOSIS — I2510 Atherosclerotic heart disease of native coronary artery without angina pectoris: Secondary | ICD-10-CM

## 2019-09-25 NOTE — Patient Instructions (Signed)
Medication Instructions:  Your physician recommends that you continue on your current medications as directed. Please refer to the Current Medication list given to you today.  *If you need a refill on your cardiac medications before your next appointment, please call your pharmacy*   Testing/Procedures: Dr. Debara Pickett has ordered a carotid doppler to be done at our office.    Follow-Up: At [facility], you and your health needs are our priority.  As part of our continuing mission to provide you with exceptional heart care, we have created designated Provider Care Teams.  These Care Teams include your primary Cardiologist (physician) and Advanced Practice Providers (APPs -  Physician Assistants and Nurse Practitioners) who all work together to provide you with the care you need, when you need it.  We recommend signing up for the patient portal called "MyChart".  Sign up information is provided on this After Visit Summary.  MyChart is used to connect with patients for Virtual Visits (Telemedicine).  Patients are able to view lab/test results, encounter notes, upcoming appointments, etc.  Non-urgent messages can be sent to your provider as well.   To learn more about what you can do with MyChart, go to [email or web address].    Your next appointment:   12 month(s)  The format for your next appointment:   In Person  Provider:   You may see Dr. Debara Pickett or one of the following Advanced Practice Providers on your designated Care Team:    Almyra Deforest, PA-C  Fabian Sharp, [place] or   Roby Lofts, [place]    Other Instructions

## 2019-09-25 NOTE — Progress Notes (Signed)
OFFICE NOTE  Chief Complaint:  No complaints  Primary Care Physician: Lucianne Lei, MD  HPI:  Nicole Meadows  is a pleasant 79 year old female with history of moderate coronary disease by cath on medical therapy, also problems with up-and-down weight and essential tremor. She has dyslipidemia which has been fairly well controlled and hypertension which is also at goal. Her only other concern is that really she's had some left breast pain. The symptom are somewhat toothache-like, and seemed to been improved with topical corticosteroids. She saw dermatologist who prescribed this medicine. She denies any worsening shortness of breath with exertion but has had several pound weight gain and knows that she needs to work on this. She's been taking care of her husband who recently had knee replacement.  I saw Nicole Meadows back today for follow-up. She denies any chest pain or worsening shortness of breath. She's had good blood pressure control. Her weight seems pretty stable. She has a essential tremor which is unchanged.  [name] returns today for follow-up. Over the past year she's had no new complaints. She did have an episode in the summer of upper mid back pain. He was thought to be orthopedic and she went to the hospital but ruled out and underwent testing which was negative. She's had no further episodes. Blood pressure is well-controlled. Her cholesterol is at goal per primary care provider.  08/29/2016  [name] reports a recent episode of chest pressure. She felt tightness and burning across her chest and between her shoulder blades. She was hot. She thought it was reflux however she's been on Protonix for a long time. An EKG was performed her primary care doctor's office which showed no ischemia and she said a "heart" blood test was done, I suspect a troponin which was reportedly negative. She did have a history of moderate coronary artery disease by cardiac catheterization in the  late 2000's and had a negative nuclear stress test in November 2011. She's not had any coronary risk assessment since then. In 2016 she had CT angiography of the chest which demonstrated coronary artery calcification.  09/06/2017  Nicole Meadows returns today for follow-up.  Overall she seems to be doing well.  She has been working with rehabilitation and Bobette Mo in order to get stronger.  She denies any chest pain or new reflux symptoms.  EKG shows normal sinus rhythm today.  Her blood pressure is well controlled.  09/13/2018  Nicole Meadows is seen today in follow-up.  Over the past year she is done well.  She has managed to lose about 5 pounds.  She denies any chest pain or shortness of breath.  She tells me that she did have some [employer] screening tests performed a year ago which showed moderate left carotid artery stenosis.  She has not had carotid artery testing here however she was noted to have prior moderate coronary artery disease by cath in the past.  We manage this medically.  She is followed in neurology for tremor and has no history of stroke or TIA.  Blood pressures well controlled today.  Her last lipid profile was in December 2019 which showed total cholesterol 142, HDL 68, LDL 57 triglyceride 89.  09/25/2019  Nicole Meadows returns today for follow-up.  Overall she is doing well.  She saw her neurologist this morning and reports stable essential tremor.  She denies any chest pain or shortness of breath.  Blood pressures well controlled.  Cholesterols been at goal.  She has had both COVID-19 vaccines.  She had mild carotid artery disease in the left last year.  This was noted initially by [facility] screening to be moderate however seemed less significant by our test.  I advised to repeat.  PMHx:  Past Medical History:  Diagnosis Date  . Anemia   . Arthritis   . Coronary artery disease    moderate, by cath  . Dyslipidemia   . Fibroid   . Glaucoma 09/25/2017   patient denies having  this or being diagnosed with this ever  . History of nuclear stress test 05/2010   bruce myoview; small fixed distal anteroapical, apical defect (breast attenuation or apical thinning), no reversible ischemia, post-stress EF 83%, abnormal but low risk study   . Hyperlipidemia   . Hypertension   . Tremor   . Vaginal dryness     Past Surgical History:  Procedure Laterality Date  . ABDOMINAL HYSTERECTOMY  1995  . BREAST SURGERY     Biopsy negative  . CARDIAC CATHETERIZATION  05/31/2007   no signficant CAD, low normal EF (Dr. Gerrie Nordmann)   . CATARACT EXTRACTION W/PHACO Right 03/02/2015   Procedure: CATARACT EXTRACTION PHACO AND INTRAOCULAR LENS PLACEMENT RIGHT EYE CDE=5.78;  Surgeon: [name], MD;  Location: [facility];  Service: Ophthalmology;  Laterality: Right;  . CATARACT EXTRACTION W/PHACO Left 03/19/2015   Procedure: CATARACT EXTRACTION PHACO AND INTRAOCULAR LENS PLACEMENT LEFT EYE CDE=5.44;  Surgeon: [name], MD;  Location: [facility];  Service: Ophthalmology;  Laterality: Left;  . TRANSTHORACIC ECHOCARDIOGRAM  2005   EF 60% with borderline conc LVH; mild TR, RVSP 87mmHg   . TUBAL LIGATION  1977    FAMHx:  Family History  Problem Relation Age of Onset  . Hypertension Mother   . Sudden death Mother   . Heart disease Father   . Kidney failure Father   . Cancer Sister        uterine  . Heart failure Brother        DM, HTN  . Aneurysm Brother        abdominal; also HTN  . Hypertension Brother        sudden death  . Hypertension Brother   . Hyperlipidemia Brother        also DM  . Hypertension Brother   . Other Brother        2 brain surgeries, bleeding    SOCHx:   reports that she has never smoked. She has never used smokeless tobacco. She reports that she does not drink alcohol or use drugs.  ALLERGIES:  Allergies  Allergen Reactions  . Hydrocodone Nausea And Vomiting  . Zocor [Simvastatin]     myalgias  . Latex Rash  . Penicillins Rash    Has patient had a PCN  reaction causing immediate rash, facial/tongue/throat swelling, SOB or lightheadedness with hypotension: Unknown Has patient had a PCN reaction causing severe rash involving mucus membranes or skin necrosis: Yes Has patient had a PCN reaction that required hospitalization: No Has patient had a PCN reaction occurring within the last 10 years: No If all of the above answers are "NO", then may proceed with Cephalosporin use.    ROS: Pertinent items noted in HPI and remainder of comprehensive ROS otherwise negative.  HOME MEDS: Current Outpatient Medications  Medication Sig Dispense Refill  . azelastine (ASTELIN) 137 MCG/SPRAY nasal spray Place 1 spray into the nose at bedtime. Use in each nostril as directed     . bacitracin-polymyxin  b (POLYSPORIN) ointment Apply 1 application topically daily. Apply to thumb for cyst daily    . calcium citrate-vitamin D (CITRACAL+D) 315-200 MG-UNIT per tablet Take 1 tablet by mouth 2 (two) times daily.    . cholecalciferol (VITAMIN D) 1000 units tablet Take 2,000 Units by mouth daily.    . Cinnamon 500 MG capsule Take 500 mg by mouth daily.    . cycloSPORINE (RESTASIS) 0.05 % ophthalmic emulsion Place 1 drop into both eyes 2 (two) times daily.     [name] desonide (DESOWEN) 0.05 % cream Apply topically 2 (two) times daily.    [name] FLAX OIL-FISH OIL-BORAGE OIL PO Take 1 capsule by mouth daily.     . fluticasone (FLONASE) 50 MCG/ACT nasal spray Place 1 spray into both nostrils every morning.     . nitroGLYCERIN (NITROLINGUAL) 0.4 MG/SPRAY spray Place 1 spray under the tongue every 5 (five) minutes x 3 doses as needed for chest pain. 12 g 2  . pantoprazole (PROTONIX) 20 MG tablet Take 1 tablet (20 mg total) by mouth daily. 14 tablet 0  . rosuvastatin (CRESTOR) 5 MG tablet Take 5 mg by mouth every evening.     . sucralfate (CARAFATE) 1 g tablet Take 1 tablet (1 g total) by mouth 4 (four) times daily -  with meals and at bedtime. 28 tablet 0  .  telmisartan-hydrochlorothiazide (MICARDIS HCT) 80-12.5 MG per tablet Take 1 tablet by mouth daily. 28 tablet 0   No current facility-administered medications for this visit.    LABS/IMAGING: No results found for this or any previous visit (from the past 48 hour(s)). No results found.  VITALS: BP 122/62   Pulse (!) 56   Ht 5' 5.5" (1.664 m)   Wt 180 lb (81.6 kg)   SpO2 99%   BMI 29.50 kg/m   EXAM: General appearance: alert and no distress Neck: no carotid bruit and no JVD Lungs: clear to auscultation bilaterally Heart: regular rate and rhythm, S1, S2 normal, no murmur, click, rub or gallop Abdomen: soft, non-tender; bowel sounds normal; no masses,  no organomegaly Extremities: extremities normal, atraumatic, no cyanosis or edema Pulses: 2+ and symmetric Skin: Skin color, texture, turgor normal. No rashes or lesions Neurologic: Grossly normal psych: Mood, affect normal  EKG: Sinus bradycardia 56-personally reviewed  ASSESSMENT: 1. Hypertension-controlled 2. Dyslipidemia-at goal 3. Essential tremor 4. Obesity 5. Moderate left carotid artery stenosis by [employer] screening - mild by our study  PLAN: 1.   Nicole Meadows seems to be doing well with well-controlled hypertension, dyslipidemia and stable tremor.  She had some moderate carotid artery stenosis however it was mild by our lab.  We will repeat her carotid Dopplers at this time.  Plan follow-up with me annually or sooner as necessary.  Pixie Casino, MD, [facility], [place] Director of the [facility]  [facility] Diplomate of the American Board of Clinical Lipidology Attending Cardiologist  Direct Dial: [phone]  Fax: [phone]  Website:  [email or web address] 09/25/2019, 12:15 PM

## 2019-09-25 NOTE — Patient Instructions (Signed)
I am glad to hear that you have been doing well.  Your exam is stable and we can continue to monitor your head and voice tremor, please follow-up routinely in 1 year.  Please continue to try to stay well-hydrated with water and try to get enough rest at night.

## 2019-09-25 NOTE — Progress Notes (Signed)
Subjective:    Patient ID: Nicole Meadows is a 79 y.o. female.  HPI     Interim history:   Nicole Meadows is a 79 year old right-handed woman with an underlying medical history of allergies, hypertension, reflux disease and obesity, who presents for follow-up consultation of her head tremor. The patient is unaccompanied today and presents for her yearly checkup. I last saw her on 09/20/2018, at which time she reported doing well from the trauma standpoint.  She did have a fall in December 2019, and hurt her right shoulder.  She had cardiac work-up because of chest pain which was negative.  She was felt to have reflux related symptoms.  Today, 3/3/202: She reports Doing well, she feels stable with regards to her tremor.  She has had no recent changes in her medications or medical history.  She tries to avoid caffeine because of reflux symptoms, sometimes drinks green tea, tries to hydrate well with water but sometimes avoids drinking too much water when she is out and about so she does not have to find a bathroom.  She got both her Covid vaccine doses (Moderna) and did well. She does note a flareup in her tremor when she is tired.  She tries to exercise on a regular basis in the form of walking, typically about 2 miles per day, at least 5 days out of the week.Her brother has a voice tremor, she has a sister with a hand tremor and she recalls that her father had tremors.  The patient's allergies, current medications, family history, past medical history, past social history, past surgical history and problem list were reviewed and updated as appropriate.    Previously:   I saw her on 09/18/2017, at which time she felt her tremor was stable. Her tremor was mostly noticeable when she was fatigued. She did have some in from issues with low back pain and joint pain. We mutually agreed to follow her clinically.     I first met her on 09/14/2016 at the request of her primary care physician, at which  time she reported a long-standing history of tremors. I suggested observation of her overall mild head tremor, and recheck in one year routinely.   09/14/2016: (She) reports a long-standing history of tremors. Some 6 weeks ago she had an exacerbation of her tremor and it affected her whole body at the time. She did have joint pain at the time and also flareup in her headaches. She has a remote history of migraine headaches but they improved significantly after she had her hysterectomy in 1995. She has occasional snoring, no morning headaches.  She has previously been seen by Nicole Meadows in our office for many years. I reviewed an office note by Nicole Meadows from 11/30/2010, at which time he talk to her about her head tremor and headaches. He had seen her many years ago in 2005 for recurrent headaches. She reported in 2012 that her head tremor was getting worse. She declined symptomatic medication at the time. She also had symptomatic dizziness at the time. I also reviewed an office note by Nicole Meadows from 04/11/2008, at which time he reported that she had a long-standing history of essential tremor for which he saw her in 2005. He also saw her for headaches. He also mentions a prior visit from November 1998 for vertigo and essential tremor. He reported in 2009 that she had never been on Topamax her beta blocker. He suggested trial of Inderal versus Topamax but was hesitant  to suggest Inderal because of prior history of pulmonary problems. They mutually agreed to forego treatment at the time. I reviewed blood test results from your office from 08/19/2016. CMP was unremarkable with the exception of calcium borderline at 11. CBC with differential was unremarkable. ESR was normal. Total cholesterol was 139, triglycerides 129, LDL 54. TSH normal. Hemoglobin A1c was 5.7, free T4 and free T3 were normal. Vitamin D was borderline at 29. She had a brain MRI with and without contrast on 08/22/2003 as well as a brain MRA without  contrast on 08/22/2003 which I reviewed: Brain MRA was normal, brain MRI was normal. She had a CTH wo contrast on 10/30/13: normal.  She had a recent nuclear stress test under Nicole Meadows on 08/30/16, and I reviewed the results: Normal EF and no ischemia.  [facility], even [facility] weight, but d/t to her husband's health issues did not go as regularly as she did not want to leave him alone at home.  She reports a recent incident of whole body trembling and had a few other episodes since then. She has a history of arthritis and had joint pains in both ankles and both knees and was given a tapering course of prednisone for 2 weeks, which started a week after the increase in tremor.  She has since then seen a rheumatologist last week, had a number of X rays, was told she has OA. She may have had a viral ill, her rheumatologist suspected d/t to flare up of joint pain and headaches last month.  Overall, she feels that since the flareup of her tremor she has reverted back to her baseline. She is not particularly bothered by her head tremor and does not report any hand tremor. She has a strong family history of tremors. Her father had hand tremors, she had a total of 9 siblings out of which 6 had some form of tremor including head, voice or hand tremors. She is very active, she is still able to do everything around the house, she drives without problems. She does not drink alcohol regularly, only on special occasions, she does not drink caffeine daily, coffee maybe 3 cups per week. She has 2 grown children. She lives with her husband, son and daughter-in-law. She is retired. She has occasional forgetfulness and mostly difficulty with names but nothing sinister.  Her Past Medical History Is Significant For: Past Medical History:  Diagnosis Date  . Anemia   . Arthritis   . Coronary artery disease    moderate, by cath  . Dyslipidemia   . Fibroid   . Glaucoma 09/25/2017   patient denies  having this or being diagnosed with this ever  . History of nuclear stress test 05/2010   bruce myoview; small fixed distal anteroapical, apical defect (breast attenuation or apical thinning), no reversible ischemia, post-stress EF 83%, abnormal but low risk study   . Hyperlipidemia   . Hypertension   . Tremor   . Vaginal dryness     Her Past Surgical History Is Significant For: Past Surgical History:  Procedure Laterality Date  . ABDOMINAL HYSTERECTOMY  1995  . BREAST SURGERY     Biopsy negative  . CARDIAC CATHETERIZATION  05/31/2007   no signficant CAD, low normal EF (Dr. Gerrie Nordmann)   . CATARACT EXTRACTION W/PHACO Right 03/02/2015   Procedure: CATARACT EXTRACTION PHACO AND INTRAOCULAR LENS PLACEMENT RIGHT EYE CDE=5.78;  Surgeon: [name], MD;  Location: [facility];  Service: Ophthalmology;  Laterality: Right;  . CATARACT EXTRACTION W/PHACO Left 03/19/2015   Procedure: CATARACT EXTRACTION PHACO AND INTRAOCULAR LENS PLACEMENT LEFT EYE CDE=5.44;  Surgeon: [name], MD;  Location: [facility];  Service: Ophthalmology;  Laterality: Left;  . TRANSTHORACIC ECHOCARDIOGRAM  2005   EF 60% with borderline conc LVH; mild TR, RVSP 56mHg   . TUBAL LIGATION  1977    Her Family History Is Significant For: Family History  Problem Relation Age of Onset  . Hypertension Mother   . Sudden death Mother   . Heart disease Father   . Kidney failure Father   . Cancer Sister        uterine  . Heart failure Brother        DM, HTN  . Aneurysm Brother        abdominal; also HTN  . Hypertension Brother        sudden death  . Hypertension Brother   . Hyperlipidemia Brother        also DM  . Hypertension Brother   . Other Brother        2 brain surgeries, bleeding    Her Social History Is Significant For: Social History   Socioeconomic History  . Marital status: Married    Spouse name: Not on file  . Number of children: Not on file  . Years of education: Not on file  . Highest education level: Not  on file  Occupational History  . Not on file  Tobacco Use  . Smoking status: Never Smoker  . Smokeless tobacco: Never Used  Substance and Sexual Activity  . Alcohol use: No  . Drug use: No  . Sexual activity: Not on file    Comment: Hysterectomy  Other Topics Concern  . Not on file  Social History Narrative  . Not on file   Social Determinants of Health   Financial Resource Strain:   . Difficulty of Paying Living Expenses: Not on file  Food Insecurity:   . Worried About RCharity fundraiserin the Last Year: Not on file  . Ran Out of Food in the Last Year: Not on file  Transportation Needs:   . Lack of Transportation (Medical): Not on file  . Lack of Transportation (Non-Medical): Not on file  Physical Activity:   . Days of Exercise per Week: Not on file  . Minutes of Exercise per Session: Not on file  Stress:   . Feeling of Stress : Not on file  Social Connections:   . Frequency of Communication with Friends and Family: Not on file  . Frequency of Social Gatherings with Friends and Family: Not on file  . Attends Religious Services: Not on file  . Active Member of Clubs or Organizations: Not on file  . Attends CArchivistMeetings: Not on file  . Marital Status: Not on file    Her Allergies Are:  Allergies  Allergen Reactions  . Hydrocodone Nausea And Vomiting  . Zocor [Simvastatin]     myalgias  . Latex Rash  . Penicillins Rash    Has patient had a PCN reaction causing immediate rash, facial/tongue/throat swelling, SOB or lightheadedness with hypotension: Unknown Has patient had a PCN reaction causing severe rash involving mucus membranes or skin necrosis: Yes Has patient had a PCN reaction that required hospitalization: No Has patient had a PCN reaction occurring within the last 10 years: No If all of the above answers are "NO", then may proceed with Cephalosporin  use.  :   Her Current Medications Are:  Outpatient Encounter Medications as of 09/25/2019   Medication Sig  . azelastine (ASTELIN) 137 MCG/SPRAY nasal spray Place 1 spray into the nose at bedtime. Use in each nostril as directed   . bacitracin-polymyxin b (POLYSPORIN) ointment Apply 1 application topically daily. Apply to thumb for cyst daily  . calcium citrate-vitamin D (CITRACAL+D) 315-200 MG-UNIT per tablet Take 1 tablet by mouth 2 (two) times daily.  . cholecalciferol (VITAMIN D) 1000 units tablet Take 2,000 Units by mouth daily.  . Cinnamon 500 MG capsule Take 500 mg by mouth daily.  . cycloSPORINE (RESTASIS) 0.05 % ophthalmic emulsion Place 1 drop into both eyes 2 (two) times daily.   [name] desonide (DESOWEN) 0.05 % cream Apply topically 2 (two) times daily.  [name] FLAX OIL-FISH OIL-BORAGE OIL PO Take 1 capsule by mouth daily.   . fluticasone (FLONASE) 50 MCG/ACT nasal spray Place 1 spray into both nostrils every morning.   . pantoprazole (PROTONIX) 20 MG tablet Take 1 tablet (20 mg total) by mouth daily.  . rosuvastatin (CRESTOR) 5 MG tablet Take 5 mg by mouth every evening.   . sucralfate (CARAFATE) 1 g tablet Take 1 tablet (1 g total) by mouth 4 (four) times daily -  with meals and at bedtime.  [name] telmisartan-hydrochlorothiazide (MICARDIS HCT) 80-12.5 MG per tablet Take 1 tablet by mouth daily.  . nitroGLYCERIN (NITROLINGUAL) 0.4 MG/SPRAY spray Place 1 spray under the tongue every 5 (five) minutes x 3 doses as needed for chest pain. (Patient not taking: Reported on 09/25/2019)   No facility-administered encounter medications on file as of 09/25/2019.  :  Review of Systems:  Out of a complete 14 point review of systems, all are reviewed and negative with the exception of these symptoms as listed below:  Review of Systems  Neurological:       Here for 1 year f/u reports she has been doing well. Tremor is the same.     Objective:  Neurological Exam  Physical Exam Physical Examination:   Vitals:   09/25/19 1023  BP: 116/65  Pulse: 63  Temp: (!) 97.4 F (36.3 C)   General  Examination: The patient is a very pleasant 79 y.o. female in no acute distress. She appears well-developed and well-nourished and well groomed.   HEENT:Normocephalic, atraumatic, pupils are equal, round and reactive to light, extraocular tracking is good without limitation to gaze excursion or nystagmus noted. Normal smooth pursuit is noted. Hearing is grossly intact. Face is symmetric with normal facial animation and normal facial sensation. Speech is clear with no dysarthria noted. There is no hypophonia. There is stable and consistent head tremor, mild and intermittent voice tremor. Neck is supple with full range of passive and active motion. There are no carotid bruits on auscultation. Oropharynx exam reveals:mildto moderate mouth dryness, adequatedental hygiene.Tongue protrudes centrally and palate elevates symmetrically.   Chest:Clear to auscultation without wheezing, rhonchi or crackles noted.  Heart:S1+S2+0, regular and normal without murmurs, rubs or gallops noted.   Abdomen:Soft, non-tender and non-distended with normal bowel sounds appreciated on auscultation.  Extremities:There isnopitting edema in the distal lower extremities bilaterally. Pedal pulses are intact.  Skin: Warm and dry without trophic changes noted.  Musculoskeletal: exam reveals no obvious joint deformities, tenderness or joint swelling or erythema, except some arthritic changes.   Neurologically:  Mental status: The patient is awake, alert and oriented in all 4 spheres.Herimmediate and remote memory, attention, language skills and fund of knowledge  are appropriate. There is no evidence of aphasia, agnosia, apraxia or anomia. Speech is clear with normal prosody and enunciation. Thought process is linear. Mood is normaland affect is normal.  Cranial nerves II - XII are as described above under HEENT exam. In addition: shoulder shrug is normal with equal shoulder height noted. Motor exam: Normal  bulk, strength and tone is noted. There is no drift,restingtremor.  (On02/21/2018: Handwriting isnot micrographic, very legible. On Archimedes spiral drawing she has no difficulty with her dominant hand which is the right and minimal trembling noted on the left).  She has no significant postural or action tremor in the UEs.Reflexes are 1-2+ throughout, except trace in the ankles. Fine motor skills and coordination: grossly intact for age.  finger taps, normal hand movements, normal rapid alternating patting, normal foot taps and normal foot agility.  Cerebellar testing: No dysmetria or intention tremor. There is no truncal or gait ataxia.  Sensory exam: intact to light touch.  Gait, station and balance:Shestands easily. No veering to one side is noted. No leaning to one side is noted. Posture is age-appropriate and stance is narrow based. Gait showsnormalstride length and normalpace. No problems turning are noted.   Assessmentand Plan:  In summary,Jaylen F Andersonis a very pleasant 78 year oldfemalewith an underlying medical history of allergies, hypertension, reflux disease and obesity, whopresents forFollow-up consultation of her tremor, in particular, her head tremor and voice tremor. She has no significant appendicular tremor. She has had mild progression with time, which is expected, has a fairly strong FHx of ET. She had prior brain scans including head CT and brain MRI and MRA in the past. Exam is stable And she also feels stable.  We will continue to monitor.  I suggested a 1 year follow-up routinely, sooner if needed. I answered all her questions today and she was in agreement. I spent 20 minutes in total face-to-face time and in reviewing records during pre-charting, more than 50% of which was spent in counseling and coordination of care, reviewing test results, reviewing medications and treatment regimen and/or in discussing or reviewing the diagnosis of ET, the  prognosis and treatment options. Pertinent laboratory and imaging test results that were available during this visit with the patient were reviewed by me and considered in my medical decision making (see chart for details).

## 2019-10-18 ENCOUNTER — Other Ambulatory Visit: Payer: Self-pay

## 2019-10-18 ENCOUNTER — Ambulatory Visit (HOSPITAL_COMMUNITY)
Admission: RE | Admit: 2019-10-18 | Discharge: 2019-10-18 | Disposition: A | Discharge status: Home / Self Care | Payer: Medicare PPO | Source: Ambulatory Visit | Attending: Internal Medicine | Admitting: Internal Medicine

## 2019-10-18 DIAGNOSIS — I6522 Occlusion and stenosis of left carotid artery: Secondary | ICD-10-CM | POA: Insufficient documentation

## 2019-12-19 DIAGNOSIS — I10 Essential (primary) hypertension: Secondary | ICD-10-CM | POA: Diagnosis not present

## 2019-12-19 DIAGNOSIS — R634 Abnormal weight loss: Secondary | ICD-10-CM | POA: Diagnosis not present

## 2019-12-19 DIAGNOSIS — E1169 Type 2 diabetes mellitus with other specified complication: Secondary | ICD-10-CM | POA: Diagnosis not present

## 2019-12-19 DIAGNOSIS — E119 Type 2 diabetes mellitus without complications: Secondary | ICD-10-CM | POA: Diagnosis not present

## 2020-01-22 DIAGNOSIS — I10 Essential (primary) hypertension: Secondary | ICD-10-CM | POA: Diagnosis not present

## 2020-01-22 DIAGNOSIS — M8588 Other specified disorders of bone density and structure, other site: Secondary | ICD-10-CM | POA: Diagnosis not present

## 2020-02-03 DIAGNOSIS — Z6829 Body mass index (BMI) 29.0-29.9, adult: Secondary | ICD-10-CM | POA: Diagnosis not present

## 2020-02-03 DIAGNOSIS — E119 Type 2 diabetes mellitus without complications: Secondary | ICD-10-CM | POA: Diagnosis not present

## 2020-02-03 DIAGNOSIS — J0100 Acute maxillary sinusitis, unspecified: Secondary | ICD-10-CM | POA: Diagnosis not present

## 2020-02-03 DIAGNOSIS — E1169 Type 2 diabetes mellitus with other specified complication: Secondary | ICD-10-CM | POA: Diagnosis not present

## 2020-02-17 DIAGNOSIS — I10 Essential (primary) hypertension: Secondary | ICD-10-CM | POA: Diagnosis not present

## 2020-02-17 DIAGNOSIS — J301 Allergic rhinitis due to pollen: Secondary | ICD-10-CM | POA: Diagnosis not present

## 2020-02-17 DIAGNOSIS — E1169 Type 2 diabetes mellitus with other specified complication: Secondary | ICD-10-CM | POA: Diagnosis not present

## 2020-04-20 DIAGNOSIS — E1169 Type 2 diabetes mellitus with other specified complication: Secondary | ICD-10-CM | POA: Diagnosis not present

## 2020-04-20 DIAGNOSIS — E785 Hyperlipidemia, unspecified: Secondary | ICD-10-CM | POA: Diagnosis not present

## 2020-04-20 DIAGNOSIS — M130 Polyarthritis, unspecified: Secondary | ICD-10-CM | POA: Diagnosis not present

## 2020-04-20 DIAGNOSIS — Z23 Encounter for immunization: Secondary | ICD-10-CM | POA: Diagnosis not present

## 2020-07-02 DIAGNOSIS — Z0189 Encounter for other specified special examinations: Secondary | ICD-10-CM | POA: Diagnosis not present

## 2020-07-02 DIAGNOSIS — E1111 Type 2 diabetes mellitus with ketoacidosis with coma: Secondary | ICD-10-CM | POA: Diagnosis not present

## 2020-07-02 DIAGNOSIS — R591 Generalized enlarged lymph nodes: Secondary | ICD-10-CM | POA: Diagnosis not present

## 2020-07-02 DIAGNOSIS — J029 Acute pharyngitis, unspecified: Secondary | ICD-10-CM | POA: Diagnosis not present

## 2020-07-08 DIAGNOSIS — Z1231 Encounter for screening mammogram for malignant neoplasm of breast: Secondary | ICD-10-CM | POA: Diagnosis not present

## 2020-07-16 DIAGNOSIS — M130 Polyarthritis, unspecified: Secondary | ICD-10-CM | POA: Diagnosis not present

## 2020-08-20 DIAGNOSIS — Z0389 Encounter for observation for other suspected diseases and conditions ruled out: Secondary | ICD-10-CM | POA: Diagnosis not present

## 2020-09-15 DIAGNOSIS — J029 Acute pharyngitis, unspecified: Secondary | ICD-10-CM | POA: Diagnosis not present

## 2020-09-15 DIAGNOSIS — J302 Other seasonal allergic rhinitis: Secondary | ICD-10-CM | POA: Insufficient documentation

## 2020-09-15 DIAGNOSIS — K219 Gastro-esophageal reflux disease without esophagitis: Secondary | ICD-10-CM | POA: Diagnosis not present

## 2020-09-24 ENCOUNTER — Ambulatory Visit: Payer: Medicare PPO | Admitting: Neurology

## 2020-09-24 ENCOUNTER — Encounter: Payer: Self-pay | Admitting: Neurology

## 2020-09-24 VITALS — BP 122/74 | HR 77 | Ht 65.5 in | Wt 179.0 lb

## 2020-09-24 DIAGNOSIS — G250 Essential tremor: Secondary | ICD-10-CM | POA: Diagnosis not present

## 2020-09-24 DIAGNOSIS — M542 Cervicalgia: Secondary | ICD-10-CM

## 2020-09-24 NOTE — Patient Instructions (Addendum)
It was nice to see you again today.  Your tremor is stable and we can continue to monitor on a yearly basis.   For your left-sided neck pain, we can consider a MRI of the neck if you continue to have problems.  If your pain gets worse and it radiates to the left arm more consistently or starts causing burning pain down the arm, please let me know, we can order an MRI and compare findings with your previous MRI, you had a cervical spine/neck MRI almost 10 years ago which did show some changes at the C6-7 level, although changes were noted on the right side rather than left at the time.  Nevertheless, there is possibility that you have had more degenerative changes and pinched nerve type findings.  For now, continue to use Tylenol as needed and you could use a microwavable heatpad for your neck discomfort. Avoid falling asleep with it! Do not use an electric heat pad or blanket due to burn risk!

## 2020-09-24 NOTE — Progress Notes (Signed)
Subjective:    Patient ID: Nicole Meadows is a 80 y.o. female.  HPI     Interim history:   Nicole Meadows is a 80 year old right-handed woman with an underlying medical history of allergies, hypertension, reflux disease and obesity, who presents for follow-up consultation of her head tremor. The patient is unaccompanied today and presents for her yearly checkup. I last saw her on 09/20/2018, at which time she reported doing well from the tremor standpoint.  She did have a fall in December 2019, and hurt her right shoulder.  She had cardiac work-up because of chest pain which was negative.  She was felt to have reflux related symptoms.  Today, 09/25/2019: She reports feeling stable as far as her tremor is concerned but she has noticed in the recent past left-sided neck pain, sometimes it seems to radiate to the left upper arm in the triceps area and stop at the elbow.  It is not a severe pain, she rarely even takes Tylenol for it, she has tried [employer].  She does not have much in the way of limitation during the day but sometimes at night she has discomfort and finds it hard to find a comfortable sleep position.  Tremor is stable, she has not had any changes in her medications or medical history.  Years ago she had a cervical spine MRI she did have a cervical spine MRI.  She had a C-spine MRI without contrast on 12/09/2010 and I reviewed the results:  IMPRESSION:  Small right paracentral protrusion at C6-7 contacts  and mildly deforms right hemicord.   She has not had any recent falls, she tries to hydrate well. She denies any headaches or pulsatile tinnitus.  The patient's allergies, current medications, family history, past medical history, past social history, past surgical history and problem list were reviewed and updated as appropriate.    Previously:   I saw her on 09/18/2017, at which time she felt her tremor was stable. Her tremor was mostly noticeable when she was fatigued. She did  have some in from issues with low back pain and joint pain. We mutually agreed to follow her clinically.     I first met her on 09/14/2016 at the request of her primary care physician, at which time she reported a long-standing history of tremors. I suggested observation of her overall mild head tremor, and recheck in one year routinely.   09/14/2016: (She) reports a long-standing history of tremors. Some 6 weeks ago she had an exacerbation of her tremor and it affected her whole body at the time. She did have joint pain at the time and also flareup in her headaches. She has a remote history of migraine headaches but they improved significantly after she had her hysterectomy in 1995. She has occasional snoring, no morning headaches.  She has previously been seen by Dr. Erling Cruz in our office for many years. I reviewed an office note by Dr. Erling Cruz from 11/30/2010, at which time he talk to her about her head tremor and headaches. He had seen her many years ago in 2005 for recurrent headaches. She reported in 2012 that her head tremor was getting worse. She declined symptomatic medication at the time. She also had symptomatic dizziness at the time. I also reviewed an office note by Dr. love from 04/11/2008, at which time he reported that she had a long-standing history of essential tremor for which he saw her in 2005. He also saw her for headaches. He also mentions a  prior visit from November 1998 for vertigo and essential tremor. He reported in 2009 that she had never been on Topamax her beta blocker. He suggested trial of Inderal versus Topamax but was hesitant to suggest Inderal because of prior history of pulmonary problems. They mutually agreed to forego treatment at the time. I reviewed blood test results from your office from 08/19/2016. CMP was unremarkable with the exception of calcium borderline at 11. CBC with differential was unremarkable. ESR was normal. Total cholesterol was 139, triglycerides 129, LDL  54. TSH normal. Hemoglobin A1c was 5.7, free T4 and free T3 were normal. Vitamin D was borderline at 29. She had a brain MRI with and without contrast on 08/22/2003 as well as a brain MRA without contrast on 08/22/2003 which I reviewed: Brain MRA was normal, brain MRI was normal. She had a CTH wo contrast on 10/30/13: normal.  She had a recent nuclear stress test under Dr. Debara Pickett on 08/30/16, and I reviewed the results: Normal EF and no ischemia.  [facility], even lost weight, but d/t to her husband's health issues did not go as regularly as she did not want to leave him alone at home.  She reports a recent incident of whole body trembling and had a few other episodes since then. She has a history of arthritis and had joint pains in both ankles and both knees and was given a tapering course of prednisone for 2 weeks, which started a week after the increase in tremor.  She has since then seen a rheumatologist last week, had a number of X rays, was told she has OA. She may have had a viral ill, her rheumatologist suspected d/t to flare up of joint pain and headaches last month.  Overall, she feels that since the flareup of her tremor she has reverted back to her baseline. She is not particularly bothered by her head tremor and does not report any hand tremor. She has a strong family history of tremors. Her father had hand tremors, she had a total of 9 siblings out of which 6 had some form of tremor including head, voice or hand tremors. She is very active, she is still able to do everything around the house, she drives without problems. She does not drink alcohol regularly, only on special occasions, she does not drink caffeine daily, coffee maybe 3 cups per week. She has 2 grown children. She lives with her husband, son and daughter-in-law. She is retired. She has occasional forgetfulness and mostly difficulty with names but nothing sinister. Her Past Medical History Is Significant  For: Past Medical History:  Diagnosis Date  . Anemia   . Arthritis   . Coronary artery disease    moderate, by cath  . Dyslipidemia   . Fibroid   . Glaucoma 09/25/2017   patient denies having this or being diagnosed with this ever  . History of nuclear stress test 05/2010   bruce myoview; small fixed distal anteroapical, apical defect (breast attenuation or apical thinning), no reversible ischemia, post-stress EF 83%, abnormal but low risk study   . Hyperlipidemia   . Hypertension   . Tremor   . Vaginal dryness     Her Past Surgical History Is Significant For: Past Surgical History:  Procedure Laterality Date  . ABDOMINAL HYSTERECTOMY  1995  . BREAST SURGERY     Biopsy negative  . CARDIAC CATHETERIZATION  05/31/2007   no signficant CAD, low normal EF (  Dr. Gerrie Nordmann)   . CATARACT EXTRACTION W/PHACO Right 03/02/2015   Procedure: CATARACT EXTRACTION PHACO AND INTRAOCULAR LENS PLACEMENT RIGHT EYE CDE=5.78;  Surgeon: [name], MD;  Location: [facility];  Service: Ophthalmology;  Laterality: Right;  . CATARACT EXTRACTION W/PHACO Left 03/19/2015   Procedure: CATARACT EXTRACTION PHACO AND INTRAOCULAR LENS PLACEMENT LEFT EYE CDE=5.44;  Surgeon: [name], MD;  Location: [facility];  Service: Ophthalmology;  Laterality: Left;  . TRANSTHORACIC ECHOCARDIOGRAM  2005   EF 60% with borderline conc LVH; mild TR, RVSP 75mHg   . TUBAL LIGATION  1977    Her Family History Is Significant For: Family History  Problem Relation Age of Onset  . Hypertension Mother   . Sudden death Mother   . Heart disease Father   . Kidney failure Father   . Cancer Sister        uterine  . Heart failure Brother        DM, HTN  . Aneurysm Brother        abdominal; also HTN  . Hypertension Brother        sudden death  . Hypertension Brother   . Hyperlipidemia Brother        also DM  . Hypertension Brother   . Other Brother        2 brain surgeries, bleeding    Her Social History Is Significant For: Social  History   Socioeconomic History  . Marital status: Married    Spouse name: Not on file  . Number of children: Not on file  . Years of education: Not on file  . Highest education level: Not on file  Occupational History  . Not on file  Tobacco Use  . Smoking status: Never Smoker  . Smokeless tobacco: Never Used  Substance and Sexual Activity  . Alcohol use: No  . Drug use: No  . Sexual activity: Not on file    Comment: Hysterectomy  Other Topics Concern  . Not on file  Social History Narrative  . Not on file   Social Determinants of Health   Financial Resource Strain: Not on file  Food Insecurity: Not on file  Transportation Needs: Not on file  Physical Activity: Not on file  Stress: Not on file  Social Connections: Not on file    Her Allergies Are:  Allergies  Allergen Reactions  . Hydrocodone Nausea And Vomiting  . Zocor [Simvastatin]     myalgias  . Latex Rash  . Penicillins Rash    Has patient had a PCN reaction causing immediate rash, facial/tongue/throat swelling, SOB or lightheadedness with hypotension: Unknown Has patient had a PCN reaction causing severe rash involving mucus membranes or skin necrosis: Yes Has patient had a PCN reaction that required hospitalization: No Has patient had a PCN reaction occurring within the last 10 years: No If all of the above answers are "NO", then may proceed with Cephalosporin use.  :   Her Current Medications Are:  Outpatient Encounter Medications as of 09/24/2020  Medication Sig  . azelastine (ASTELIN) 137 MCG/SPRAY nasal spray Place 1 spray into the nose at bedtime. Use in each nostril as directed  . bacitracin-polymyxin b (POLYSPORIN) ointment Apply 1 application topically daily. Apply to thumb for cyst daily  . calcium citrate-vitamin D (CITRACAL+D) 315-200 MG-UNIT per tablet Take 1 tablet by mouth 2 (two) times daily.  . cholecalciferol (VITAMIN D) 1000 units tablet Take 2,000 Units by mouth daily.  . Cinnamon 500  MG capsule Take 500  mg by mouth daily.  . cycloSPORINE (RESTASIS) 0.05 % ophthalmic emulsion Place 1 drop into both eyes 2 (two) times daily.   [name] desonide (DESOWEN) 0.05 % cream Apply topically 2 (two) times daily.  [name] FLAX OIL-FISH OIL-BORAGE OIL PO Take 1 capsule by mouth daily.   . fluticasone (FLONASE) 50 MCG/ACT nasal spray Place 1 spray into both nostrils every morning.   . nitroGLYCERIN (NITROLINGUAL) 0.4 MG/SPRAY spray Place 1 spray under the tongue every 5 (five) minutes x 3 doses as needed for chest pain.  . pantoprazole (PROTONIX) 20 MG tablet Take 1 tablet (20 mg total) by mouth daily.  . rosuvastatin (CRESTOR) 5 MG tablet Take 5 mg by mouth every evening.   . sucralfate (CARAFATE) 1 g tablet Take 1 tablet (1 g total) by mouth 4 (four) times daily -  with meals and at bedtime.  [name] telmisartan-hydrochlorothiazide (MICARDIS HCT) 80-12.5 MG per tablet Take 1 tablet by mouth daily.   No facility-administered encounter medications on file as of 09/24/2020.  :  Review of Systems:  Out of a complete 14 point review of systems, all are reviewed and negative with the exception of these symptoms as listed below:  Review of Systems  Neurological:       Here for 1 year f/u. Pt reports she has been stable with tremors. She does states some bursitis in the left shoulder since last visit. Overall doing very well.    Objective:  Neurological Exam  Physical Exam Physical Examination:   Vitals:   09/24/20 1013  BP: 122/74  Pulse: 77  SpO2: 98%    General Examination: The patient is a very pleasant 80 y.o. female in no acute distress. She appears well-developed and well-nourished and well groomed.   HEENT:Normocephalic, atraumatic, pupils are equal, round and reactive to light, extraocular tracking is good without limitation to gaze excursion or nystagmus noted. Normal smooth pursuit is noted. Hearing is grossly intact. Face is symmetric with normal facial animation and normal facial  sensation. Speech is clear with no dysarthria noted. There is no hypophonia. Thereis stable and consistent head tremor, mild and intermittent voice tremor. Neck is supple with full range of passive and active motion, but she does report mild discomfort with neck turn to the left. There are no carotid bruits on auscultation. Oropharynx exam reveals:mildto moderate mouth dryness, adequatedental hygiene.Tongue protrudes centrally and palate elevates symmetrically.   Chest:Clear to auscultation without wheezing, rhonchi or crackles noted.  Heart:S1+S2+0, regular and normal without murmurs, rubs or gallops noted.   Abdomen:Soft, non-tender and non-distended with normal bowel sounds appreciated on auscultation.  Extremities:There isnopitting edema in the distal lower extremities bilaterally.  Skin: Warm and dry without trophic changes noted.  Musculoskeletal: exam reveals no obvious joint deformities, tenderness or joint swelling, good range of motion in both shoulders.    Neurologically:  Mental status: The patient is awake, alert and oriented in all 4 spheres.Herimmediate and remote memory, attention, language skills and fund of knowledge are appropriate. There is no evidence of aphasia, agnosia, apraxia or anomia. Speech is clear with normal prosody and enunciation. Thought process is linear. Mood is normaland affect is normal.  Cranial nerves II - XII are as described above under HEENT exam. Motor exam: Normal bulk, strength and tone is noted. There is no drift,restingtremor.  (On02/21/2018: Handwriting isnot micrographic, very legible. On Archimedes spiral drawing she has no difficulty with her dominant hand which is the right and minimal trembling noted on the left).  She has no significant postural or action tremor in the UEs.Reflexes are1+ throughout, except trace in the ankles. Fine motor skills and coordination:grosslyintactfor age.finger taps, normal hand  movements, normal rapid alternating patting, normal foot taps and normal foot agility.  Cerebellar testing: No dysmetria or intention tremor. There is no truncal or gait ataxia.  Sensory exam: intact to light touch.  Gait, station and balance:Shestands easily. No veering to one side is noted. No leaning to one side is noted. Posture is age-appropriate and stance is narrow based. Gait showsnormalstride length and normalpace. No problems turning are noted.   Assessmentand Plan:  In summary,Larina F Andersonis a very pleasant 69 year oldfemalewith an underlying medical history of allergies, hypertension, reflux disease and overweight state, who presents for follow-up consultation of her essential tremor affecting her head and voice.  She does not have any significant hand tremor and has remained stable over the past year.  Overall, with time, she has had mild progression.  She has a strong family history of essential tremor.  Prior examinations included head CT and she also has had a brain MRI and MR angiogram in the past.  Her current exam is stable as far as the tremor.  She has experienced some neck pain in the past few weeks.  She has taken Tylenol very occasionally.  She is advised to continue to monitor her symptoms and she could try a microwavable heat pad.  She is discouraged from using any electric heat pad.  We could consider a MRI of the neck and compare with findings from 2012.  It is possible that she has had some degenerative changes and radiculopathy in the neck.  She will monitor her symptoms and let us know if she would like to proceed with a cervical spine MRI.  She does report a history of degenerative lumbar spine disease.  We will also continue to monitor her tremor.  She is advised to follow-up routinely in 1 year in this office, sooner if needed.  I answered all her questions today and she was in agreement.   I spent 30 minutes in total face-to-face time and in reviewing  records during pre-charting, more than 50% of which was spent in counseling and coordination of care, reviewing test results, reviewing medications and treatment regimen and/or in discussing or reviewing the diagnosis of ET and neck pain, the prognosis and treatment options. Pertinent laboratory and imaging test results that were available during this visit with the patient were reviewed by me and considered in my medical decision making (see chart for details).

## 2020-10-08 ENCOUNTER — Ambulatory Visit (INDEPENDENT_AMBULATORY_CARE_PROVIDER_SITE_OTHER): Payer: Medicare PPO | Admitting: Internal Medicine

## 2020-10-08 ENCOUNTER — Other Ambulatory Visit: Payer: Self-pay

## 2020-10-08 ENCOUNTER — Encounter: Payer: Self-pay | Admitting: Internal Medicine

## 2020-10-08 VITALS — BP 92/51 | HR 62 | Ht 65.5 in | Wt 175.8 lb

## 2020-10-08 DIAGNOSIS — I6522 Occlusion and stenosis of left carotid artery: Secondary | ICD-10-CM

## 2020-10-08 DIAGNOSIS — I10 Essential (primary) hypertension: Secondary | ICD-10-CM | POA: Diagnosis not present

## 2020-10-08 DIAGNOSIS — I2510 Atherosclerotic heart disease of native coronary artery without angina pectoris: Secondary | ICD-10-CM | POA: Diagnosis not present

## 2020-10-08 DIAGNOSIS — E785 Hyperlipidemia, unspecified: Secondary | ICD-10-CM | POA: Diagnosis not present

## 2020-10-08 DIAGNOSIS — I2584 Coronary atherosclerosis due to calcified coronary lesion: Secondary | ICD-10-CM

## 2020-10-08 NOTE — Patient Instructions (Signed)

## 2020-10-08 NOTE — Progress Notes (Signed)
OFFICE NOTE  Chief Complaint:  No complaints  Primary Care Physician: Lucianne Lei, MD  HPI:  Nicole Meadows  is a pleasant 80 year old female with history of moderate coronary disease by cath on medical therapy, also problems with up-and-down weight and essential tremor. She has dyslipidemia which has been fairly well controlled and hypertension which is also at goal. Her only other concern is that really she's had some left breast pain. The symptom are somewhat toothache-like, and seemed to been improved with topical corticosteroids. She saw dermatologist who prescribed this medicine. She denies any worsening shortness of breath with exertion but has had several pound weight gain and knows that she needs to work on this. She's been taking care of her husband who recently had knee replacement.  I saw Nicole Meadows back today for follow-up. She denies any chest pain or worsening shortness of breath. She's had good blood pressure control. Her weight seems pretty stable. She has a essential tremor which is unchanged.  Nicole Meadows returns today for follow-up. Over the past year she's had no new complaints. She did have an episode in the summer of upper mid back pain. He was thought to be orthopedic and she went to the hospital but ruled out and underwent testing which was negative. She's had no further episodes. Blood pressure is well-controlled. Her cholesterol is at goal per primary care provider.  08/29/2016  [name] reports a recent episode of chest pressure. She felt tightness and burning across her chest and between her shoulder blades. She was hot. She thought it was reflux however she's been on Protonix for a long time. An EKG was performed her primary care doctor's office which showed no ischemia and she said a "heart" blood test was done, I suspect a troponin which was reportedly negative. She did have a history of moderate coronary artery disease by cardiac catheterization in the  late 2000's and had a negative nuclear stress test in November 2011. She's not had any coronary risk assessment since then. In 2016 she had CT angiography of the chest which demonstrated coronary artery calcification.  09/06/2017  Nicole Meadows returns today for follow-up.  Overall she seems to be doing well.  She has been working with rehabilitation and Bobette Mo in order to get stronger.  She denies any chest pain or new reflux symptoms.  EKG shows normal sinus rhythm today.  Her blood pressure is well controlled.  09/13/2018  Nicole Meadows is seen today in follow-up.  Over the past year she is done well.  She has managed to lose about 5 pounds.  She denies any chest pain or shortness of breath.  She tells me that she did have some [employer] screening tests performed a year ago which showed moderate left carotid artery stenosis.  She has not had carotid artery testing here however she was noted to have prior moderate coronary artery disease by cath in the past.  We manage this medically.  She is followed in neurology for tremor and has no history of stroke or TIA.  Blood pressures well controlled today.  Her last lipid profile was in December 2019 which showed total cholesterol 142, HDL 68, LDL 57 triglyceride 89.  09/25/2019  Nicole Meadows returns today for follow-up.  Overall she is doing well.  She saw her neurologist this morning and reports stable essential tremor.  She denies any chest pain or shortness of breath.  Blood pressures well controlled.  Cholesterols been at goal.  She has had both COVID-19 vaccines.  She had mild carotid artery disease in the left last year.  This was noted initially by [facility] screening to be moderate however seemed less significant by our test.  I advised to repeat.  10/08/2020  Nicole Meadows is seen today for follow-up.  She still has issues with some pain in her left neck that radiates down the arm.  She saw the neurologist about this and she felt it could be related  to tremor however she is going to get another opinion from an orthopedist.  I suspect it might be related to her cervical spine.  Blood pressure was low today which she says is unusual for her.  In general it runs in the low 993Z systolic.  EKG shows a normal sinus rhythm.  She had labs little less than a year ago which showed total cholesterol 146, HDL 65, triglycerides 93 and LDL 63.  This is followed routinely by Dr. Criss Rosales.  PMHx:  Past Medical History:  Diagnosis Date  . Anemia   . Arthritis   . Coronary artery disease    moderate, by cath  . Dyslipidemia   . Fibroid   . Glaucoma 09/25/2017   patient denies having this or being diagnosed with this ever  . History of nuclear stress test 05/2010   bruce myoview; small fixed distal anteroapical, apical defect (breast attenuation or apical thinning), no reversible ischemia, post-stress EF 83%, abnormal but low risk study   . Hyperlipidemia   . Hypertension   . Tremor   . Vaginal dryness     Past Surgical History:  Procedure Laterality Date  . ABDOMINAL HYSTERECTOMY  1995  . BREAST SURGERY     Biopsy negative  . CARDIAC CATHETERIZATION  05/31/2007   no signficant CAD, low normal EF (Dr. Gerrie Nordmann)   . CATARACT EXTRACTION W/PHACO Right 03/02/2015   Procedure: CATARACT EXTRACTION PHACO AND INTRAOCULAR LENS PLACEMENT RIGHT EYE CDE=5.78;  Surgeon: [name], MD;  Location: [facility];  Service: Ophthalmology;  Laterality: Right;  . CATARACT EXTRACTION W/PHACO Left 03/19/2015   Procedure: CATARACT EXTRACTION PHACO AND INTRAOCULAR LENS PLACEMENT LEFT EYE CDE=5.44;  Surgeon: [name], MD;  Location: [facility];  Service: Ophthalmology;  Laterality: Left;  . TRANSTHORACIC ECHOCARDIOGRAM  2005   EF 60% with borderline conc LVH; mild TR, RVSP 44mmHg   . TUBAL LIGATION  1977    FAMHx:  Family History  Problem Relation Age of Onset  . Hypertension Mother   . Sudden death Mother   . Heart disease Father   . Kidney failure Father   . Cancer  Sister        uterine  . Heart failure Brother        DM, HTN  . Aneurysm Brother        abdominal; also HTN  . Hypertension Brother        sudden death  . Hypertension Brother   . Hyperlipidemia Brother        also DM  . Hypertension Brother   . Other Brother        2 brain surgeries, bleeding    SOCHx:   reports that she has never smoked. She has never used smokeless tobacco. She reports that she does not drink alcohol and does not use drugs.  ALLERGIES:  Allergies  Allergen Reactions  . Hydrocodone Nausea And Vomiting  . Zocor [Simvastatin]     myalgias  . Latex Rash  . Penicillins Rash  Has patient had a PCN reaction causing immediate rash, facial/tongue/throat swelling, SOB or lightheadedness with hypotension: Unknown Has patient had a PCN reaction causing severe rash involving mucus membranes or skin necrosis: Yes Has patient had a PCN reaction that required hospitalization: No Has patient had a PCN reaction occurring within the last 10 years: No If all of the above answers are "NO", then may proceed with Cephalosporin use.    ROS: Pertinent items noted in HPI and remainder of comprehensive ROS otherwise negative.  HOME MEDS: Current Outpatient Medications  Medication Sig Dispense Refill  . azelastine (ASTELIN) 137 MCG/SPRAY nasal spray Place 1 spray into the nose at bedtime. Use in each nostril as directed    . bacitracin-polymyxin b (POLYSPORIN) ointment Apply 1 application topically daily. Apply to thumb for cyst daily    . cholecalciferol (VITAMIN D) 1000 units tablet Take 2,000 Units by mouth daily.    . Cinnamon 500 MG capsule Take 500 mg by mouth daily.    . cycloSPORINE (RESTASIS) 0.05 % ophthalmic emulsion Place 1 drop into both eyes 2 (two) times daily.     [name] desonide (DESOWEN) 0.05 % cream Apply topically 2 (two) times daily.    [name] FLAX OIL-FISH OIL-BORAGE OIL PO Take 1 capsule by mouth daily.     . fluticasone (FLONASE) 50 MCG/ACT nasal spray Place 1  spray into both nostrils every morning.     . nitroGLYCERIN (NITROLINGUAL) 0.4 MG/SPRAY spray Place 1 spray under the tongue every 5 (five) minutes x 3 doses as needed for chest pain. 12 g 2  . pantoprazole (PROTONIX) 20 MG tablet Take 1 tablet (20 mg total) by mouth daily. 14 tablet 0  . rosuvastatin (CRESTOR) 5 MG tablet Take 5 mg by mouth every evening.     . sucralfate (CARAFATE) 1 g tablet Take 1 tablet (1 g total) by mouth 4 (four) times daily -  with meals and at bedtime. 28 tablet 0  . telmisartan-hydrochlorothiazide (MICARDIS HCT) 80-12.5 MG per tablet Take 1 tablet by mouth daily. 28 tablet 0   No current facility-administered medications for this visit.    LABS/IMAGING: No results found for this or any previous visit (from the past 48 hour(s)). No results found.  VITALS: BP (!) 92/51   Pulse 62   Ht 5' 5.5" (1.664 m)   Wt 175 lb 12.8 oz (79.7 kg)   SpO2 99%   BMI 28.81 kg/m   EXAM: General appearance: alert and no distress Neck: no carotid bruit and no JVD Lungs: clear to auscultation bilaterally Heart: regular rate and rhythm, S1, S2 normal, no murmur, click, rub or gallop Abdomen: soft, non-tender; bowel sounds normal; no masses,  no organomegaly Extremities: extremities normal, atraumatic, no cyanosis or edema Pulses: 2+ and symmetric Skin: Skin color, texture, turgor normal. No rashes or lesions Neurologic: Grossly normal psych: Mood, affect normal  EKG: Normal sinus rhythm at 62, moderate voltage criteria for LVH-personally reviewed  ASSESSMENT: 1. Hypertension-controlled 2. Dyslipidemia-at goal 3. Essential tremor 4. Obesity 5. Moderate left carotid artery stenosis by [employer] screening - mild by our study  PLAN: 1.   Mrs. Bortle has good control of her blood pressure which is actually quite low today.  She said this is unusual for her she does have some headache.  Cholesterols been at goal.  Her essential tremor is stable and followed by neurology.  I  have encouraged her to continue with weight loss and she is down about 4 pounds since her recent  lab work.  She had repeat carotid study last year which showed minimal carotid plaque and no routine follow-up was recommended.  Follow-up with me annually or sooner as necessary.  Pixie Casino, MD, [facility], [place] Director of the [facility]  [facility] Diplomate of the American Board of Clinical Lipidology Attending Cardiologist  Direct Dial: [phone]  Fax: [phone]  Website:  [email or web address] [email or web address] Chancellor 10/08/2020, 12:26 PM

## 2020-10-20 DIAGNOSIS — I10 Essential (primary) hypertension: Secondary | ICD-10-CM | POA: Diagnosis not present

## 2020-10-20 DIAGNOSIS — M130 Polyarthritis, unspecified: Secondary | ICD-10-CM | POA: Diagnosis not present

## 2020-11-03 ENCOUNTER — Encounter: Payer: Self-pay | Admitting: Orthopedic Surgery

## 2020-11-03 ENCOUNTER — Other Ambulatory Visit: Payer: Self-pay

## 2020-11-03 ENCOUNTER — Ambulatory Visit: Payer: Medicare PPO

## 2020-11-03 ENCOUNTER — Ambulatory Visit: Payer: Medicare PPO | Admitting: Orthopedic Surgery

## 2020-11-03 VITALS — BP 139/65 | HR 65 | Ht 65.5 in | Wt 178.0 lb

## 2020-11-03 DIAGNOSIS — M25562 Pain in left knee: Secondary | ICD-10-CM

## 2020-11-03 DIAGNOSIS — M1712 Unilateral primary osteoarthritis, left knee: Secondary | ICD-10-CM | POA: Diagnosis not present

## 2020-11-03 NOTE — Progress Notes (Signed)
New Patient Visit  Assessment: Nicole Meadows is a 80 y.o. female with the following: Left knee pain; mild to moderate medial compartment arthritis   Plan: Pain in her left knee for the last 2-3 weeks.  No specific injury, but did start after repeated kneeling to clean some baseboards.  Mild effusion, but no significant effusion with the onset of pain.  Reviewed radiographs in clinic today which demonstrates medial compartment joint space narrowing.  It is possible that she sustained an injury to her medial meniscus, but most likely her symptoms are result of mild to moderate knee arthritis.  Reviewed multiple options including continue with current medications, trying a brace or consideration for a left knee corticosteroid injection.  At this point in time, she will continue with her current regimen.  If she has any issues in the future, she can return to clinic for repeat evaluation of her left knee, or further evaluation of her left shoulder which has been causing her some discomfort.  All questions were answered and she is amenable to this plan.  Follow-up: Return if symptoms worsen or fail to improve.  Subjective:  Chief Complaint  Patient presents with  . Knee Pain    Lt knee pain for 2 wks NKI.     History of Present Illness: Nicole Meadows is a 80 y.o. female who has been referred to clinic today by Lucianne Lei, MD for evaluation of left knee pain.  She states she has been having pain, primarily located within the medial side of her knee for the last 2-3 weeks.  No specific injury caused her pain.  No injuries to this knee in the past.  However, around the onset of her pain, she reports doing a lot of kneeling in order to clean some baseboards.  Since then, the pain has prevented her from her usual activities.  Her knee did not immediately swell.  Since then, her pain is improved.  She does not notice significant swelling within her knee.  She has been taking Tylenol occasionally.   She has not tried a brace.  She is not had an injection in her left knee.  Of note, she reports having ongoing left shoulder difficulties for several years.  She has previously been evaluated by Dr. Aline Brochure, and had excellent result following a steroid injection.   Review of Systems: No fevers or chills No numbness or tingling No chest pain No shortness of breath No bowel or bladder dysfunction No GI distress No headaches   Medical History:  Past Medical History:  Diagnosis Date  . Anemia   . Arthritis   . Coronary artery disease    moderate, by cath  . Dyslipidemia   . Fibroid   . Glaucoma 09/25/2017   patient denies having this or being diagnosed with this ever  . History of nuclear stress test 05/2010   bruce myoview; small fixed distal anteroapical, apical defect (breast attenuation or apical thinning), no reversible ischemia, post-stress EF 83%, abnormal but low risk study   . Hyperlipidemia   . Hypertension   . Tremor   . Vaginal dryness     Past Surgical History:  Procedure Laterality Date  . ABDOMINAL HYSTERECTOMY  1995  . BREAST SURGERY     Biopsy negative  . CARDIAC CATHETERIZATION  05/31/2007   no signficant CAD, low normal EF (Dr. Gerrie Nordmann)   . CATARACT EXTRACTION W/PHACO Right 03/02/2015   Procedure: CATARACT EXTRACTION PHACO AND INTRAOCULAR LENS PLACEMENT RIGHT EYE CDE=5.78;  Surgeon: [name], MD;  Location: [facility];  Service: Ophthalmology;  Laterality: Right;  . CATARACT EXTRACTION W/PHACO Left 03/19/2015   Procedure: CATARACT EXTRACTION PHACO AND INTRAOCULAR LENS PLACEMENT LEFT EYE CDE=5.44;  Surgeon: [name], MD;  Location: [facility];  Service: Ophthalmology;  Laterality: Left;  . TRANSTHORACIC ECHOCARDIOGRAM  2005   EF 60% with borderline conc LVH; mild TR, RVSP 26mmHg   . TUBAL LIGATION  1977    Family History  Problem Relation Age of Onset  . Hypertension Mother   . Sudden death Mother   . Heart disease Father   . Kidney failure Father   .  Cancer Sister        uterine  . Heart failure Brother        DM, HTN  . Aneurysm Brother        abdominal; also HTN  . Hypertension Brother        sudden death  . Hypertension Brother   . Hyperlipidemia Brother        also DM  . Hypertension Brother   . Other Brother        2 brain surgeries, bleeding   Social History   Tobacco Use  . Smoking status: Never Smoker  . Smokeless tobacco: Never Used  Substance Use Topics  . Alcohol use: No  . Drug use: No    Allergies  Allergen Reactions  . Hydrocodone Nausea And Vomiting  . Zocor [Simvastatin]     myalgias  . Latex Rash  . Penicillins Rash    Has patient had a PCN reaction causing immediate rash, facial/tongue/throat swelling, SOB or lightheadedness with hypotension: Unknown Has patient had a PCN reaction causing severe rash involving mucus membranes or skin necrosis: Yes Has patient had a PCN reaction that required hospitalization: No Has patient had a PCN reaction occurring within the last 10 years: No If all of the above answers are "NO", then may proceed with Cephalosporin use.    Current Meds  Medication Sig  . azelastine (ASTELIN) 137 MCG/SPRAY nasal spray Place 1 spray into the nose at bedtime. Use in each nostril as directed  . bacitracin-polymyxin b (POLYSPORIN) ointment Apply 1 application topically daily. Apply to thumb for cyst daily  . cholecalciferol (VITAMIN D) 1000 units tablet Take 2,000 Units by mouth daily.  . Cinnamon 500 MG capsule Take 500 mg by mouth daily.  . cycloSPORINE (RESTASIS) 0.05 % ophthalmic emulsion Place 1 drop into both eyes 2 (two) times daily.   [name] desonide (DESOWEN) 0.05 % cream Apply topically 2 (two) times daily.  . diclofenac Sodium (VOLTAREN) 1 % GEL Apply topically 4 (four) times daily.  [name] FLAX OIL-FISH OIL-BORAGE OIL PO Take 1 capsule by mouth daily.   . fluticasone (FLONASE) 50 MCG/ACT nasal spray Place 1 spray into both nostrils every morning.   . nitroGLYCERIN  (NITROLINGUAL) 0.4 MG/SPRAY spray Place 1 spray under the tongue every 5 (five) minutes x 3 doses as needed for chest pain.  . pantoprazole (PROTONIX) 20 MG tablet Take 1 tablet (20 mg total) by mouth daily.  . rosuvastatin (CRESTOR) 5 MG tablet Take 5 mg by mouth every evening.   . sucralfate (CARAFATE) 1 g tablet Take 1 tablet (1 g total) by mouth 4 (four) times daily -  with meals and at bedtime.  [name] telmisartan-hydrochlorothiazide (MICARDIS HCT) 80-12.5 MG per tablet Take 1 tablet by mouth daily.    Objective: BP 139/65   Pulse 65   Ht 5' 5.5" (  1.664 m)   Wt 178 lb (80.7 kg)   BMI 29.17 kg/m   Physical Exam:  General: Alert and oriented.  No acute distress. Gait: Mild left-sided antalgic gait.  Evaluation left knee demonstrates a mild effusion.  Full range of motion from full extension to greater than 120 degrees of flexion.  Tenderness to palpation along the medial joint line.  No tenderness palpation of the lateral joint line.  Negative Lachman.  2+ DP pulse.  Sensation is intact to the foot.  Active motion intact in the EHL/TA.   IMAGING: I personally ordered and reviewed the following images   Left knee were obtained in clinic today and demonstrates a neutral alignment overall.  She has some loss of joint space, specifically within the medial compartment.  Minimal osteophytes are noted.  No acute injury.  Overall good bone quality.  Impression: Mild left knee arthritis, primarily within the medial compartment.   New Medications:  No orders of the defined types were placed in this encounter.     Mordecai Rasmussen, MD  11/03/2020 4:07 PM

## 2020-11-18 ENCOUNTER — Ambulatory Visit: Payer: Medicare PPO | Admitting: Orthopedic Surgery

## 2020-11-18 ENCOUNTER — Other Ambulatory Visit: Payer: Self-pay

## 2020-11-18 ENCOUNTER — Encounter: Payer: Self-pay | Admitting: Orthopedic Surgery

## 2020-11-18 ENCOUNTER — Ambulatory Visit (INDEPENDENT_AMBULATORY_CARE_PROVIDER_SITE_OTHER): Payer: Medicare PPO

## 2020-11-18 VITALS — BP 127/67 | HR 61 | Ht 65.5 in | Wt 173.0 lb

## 2020-11-18 DIAGNOSIS — G8929 Other chronic pain: Secondary | ICD-10-CM | POA: Diagnosis not present

## 2020-11-18 DIAGNOSIS — M7582 Other shoulder lesions, left shoulder: Secondary | ICD-10-CM | POA: Diagnosis not present

## 2020-11-18 DIAGNOSIS — M25512 Pain in left shoulder: Secondary | ICD-10-CM

## 2020-11-18 NOTE — Progress Notes (Signed)
Orthopaedic Clinic Return  Assessment: Nicole Meadows is a 80 y.o. female with the following: Left shoulder rotator cuff tendinitis.   Plan: Reviewed radiographs which are essentially normal.  Pain is consistent with irritation of the rotator cuff with some muscle spasms the left side of the neck.  Recommended focus on improving symptoms in the shoulder.  Ok to use voltaren gel on the shoulder and initiate a home exercise program.  If she prefers to try formal PT, adjust medications or return for an injection, she can contact the clinic.   Follow-up: Return if symptoms worsen or fail to improve.   Subjective:  Chief Complaint  Patient presents with  . Neck Pain    Going into neck, bothers more when sleeping or lifting, no numbness in hands  . Shoulder Pain    No trouble reaching behind her but bothers her rasing it    History of Present Illness: Nicole Meadows is a 80 y.o. female who presents for evaluation of left shoulder pain.  She is known to the practice.  She has had pain in the left shoulder for several months.  However, it is progressively worsening.  She is now having issues with pain and stiffness in the left side of her neck.  This is affecting her sleep.  She states that she had similar discomfort several years ago, which improved with medications and exercises.  No specific injury.  She also notes some numbness and tingling that happens at night, in her left hand.  This does not happen during the day however.  She will occasionally take over-the-counter medications for her pain.  She has been using Voltaren gel on her knee, which is improving her pain there, but is very reluctant to use it on her shoulder.   Review of Systems: No fevers or chills No numbness or tingling No chest pain No shortness of breath No bowel or bladder dysfunction No GI distress No headaches    Objective: BP 127/67   Pulse 61   Ht 5' 5.5" (1.664 m)   Wt 173 lb (78.5 kg)   BMI 28.35  kg/m   Physical Exam: Alert and oriented.  No acute distress.  Evaluation of left shoulder demonstrates no deformity.  No atrophy is appreciated.  Forward flexion to 120 degrees, abduction to 90 degrees at her side.  Internal rotation to lumbar spine.  Mild discomfort with empty can testing.  Positive Hawkins.  Negative belly press.  4+/5 strength in the left shoulder testing supraspinatus, infraspinatus and subscapularis.  Negative speeds.  Negative O'Brien's.  Fingers are warm and well-perfused.  Negative Tinel's at the carpal tunnel.  IMAGING: I personally ordered and reviewed the following images:   X-rays of the left shoulder were obtained in clinic today and demonstrates no acute injury.  Minimal degenerative changes noted within the glenohumeral joint.  No proximal humeral migration.  Impression: Normal left shoulder x-rays  Mordecai Rasmussen, MD 11/18/2020 8:51 PM

## 2020-11-18 NOTE — Patient Instructions (Signed)
Rotator Cuff Tear/Tendinitis Rehab   Ask your health care provider which exercises are safe for you. Do exercises exactly as told by your health care provider and adjust them as directed. It is normal to feel mild stretching, pulling, tightness, or discomfort as you do these exercises. Stop right away if you feel sudden pain or your pain gets worse. Do not begin these exercises until told by your health care provider. Stretching and range-of-motion exercises  These exercises warm up your muscles and joints and improve the movement and flexibility of your shoulder. These exercises also help to relieve pain.  Shoulder pendulum In this exercise, you let the injured arm dangle toward the floor and then swing it like a clock pendulum. 1. Stand near a table or counter that you can hold onto for balance. 2. Bend forward at the waist and let your left / right arm hang straight down. Use your other arm to support you and help you stay balanced. 3. Relax your left / right arm and shoulder muscles, and move your hips and your trunk so your left / right arm swings freely. Your arm should swing because of the motion of your body, not because you are using your arm or shoulder muscles. 4. Keep moving your hips and trunk so your arm swings in the following directions, as told by your health care provider: ? Side to side. ? Forward and backward. ? In clockwise and counterclockwise circles. 5. Slowly return to the starting position. Repeat 10 times, or for 10 seconds per direction. Complete this exercise 2-3 times a day.      Shoulder flexion, seated This exercise is sometimes called table slides. In this exercise, you raise your arm in front of your body until you feel a stretch in your injured shoulder. 1. Sit in a stable chair so your left / right forearm can rest on a flat surface. Your elbow should rest at a height that keeps your upper arm next to your body. 2. Keeping your left / right shoulder relaxed,  lean forward at the waist and let your hand slide forward (flexion). Stop when you feel a stretch in your shoulder, or when you reach the angle that is recommended by your health care provider. 3. Hold for 5 seconds. 4. Slowly return to the starting position. Repeat 10 times. Complete this exercise 1-2  times a day.       Shoulder flexion, standing In this exercise, you raise your arm in front of your body (flexion) until you feel a stretch in your injured shoulder. 1. Stand and hold a broomstick, a cane, or a similar object. Place your hands a little more than shoulder-width apart on the object. Your left / right hand should be palm-up, and your other hand should be palm-down. 2. Keep your elbow straight and your shoulder muscles relaxed. Push the stick up with your healthy arm to raise your left / right arm in front of your body, and then over your head until you feel a stretch in your shoulder. ? Avoid shrugging your shoulder while you raise your arm. Keep your shoulder blade tucked down toward the middle of your back. ? Keep your left / right shoulder muscles relaxed. 3. Hold for 10 seconds. 4. Slowly return to the starting position. Repeat 10 times. Complete this exercise 1-2 times a day.      Shoulder abduction, active-assisted You will need a stick, broom handle, or similar object to help you (assist) in doing this  exercise. 1. Lie on your back. This is the supine position. Hold a broomstick, a cane, or a similar object. 2. Place your hands a little more than shoulder-width apart on the object. Your left / right hand should be palm-up, and your other hand should be palm-down. 3. Keeping your shoulder relaxed, push the stick to raise your left / right arm out to your side (abduction) and then over your head. Use your other hand to help move the stick. Stop when you feel a stretch in your shoulder, or when you reach the angle that is recommended by your health care provider. ? Avoid  shrugging your shoulder while you raise your arm. Keep your shoulder blade tucked down toward the middle of your back. 4. Hold for 10 seconds. 5. Slowly return to the starting position. Repeat 10 times. Complete this exercise 1-2 times a day.      Shoulder flexion, active-assisted 1. Lie on your back. You may bend your knees for comfort. 2. Hold a broomstick, a cane, or a similar object so that your hands are about shoulder-width apart. Your palms should face toward your feet. 3. Raise your left / right arm over your head, then behind your head toward the floor (flexion). Use your other hand to help you do this (active-assisted). Stop when you feel a gentle stretch in your shoulder, or when you reach the angle that is recommended by your health care provider. 4. Hold for 10 seconds. 5. Use the stick and your other arm to help you return your left / right arm to the starting position. Repeat 10 times. Complete this exercise 1-2 times a day.      External rotation 1. Sit in a stable chair without armrests, or stand up. 2. Tuck a soft object, such as a folded towel or a small ball, under your left / right upper arm. 3. Hold a broomstick, a cane, or a similar object with your palms face-down, toward the floor. Bend your elbows to a 90-degree angle (right angle), and keep your hands about shoulder-width apart. 4. Straighten your healthy arm and push the stick across your body, toward your left / right side. Keep your left / right arm bent. This will rotate your left / right forearm away from your body (external rotation). 5. Hold for 10 seconds. 6. Slowly return to the starting position. Repeat 10 times. Complete this exercise 1-2 times a day.        Strengthening exercises These exercises build strength and endurance in your shoulder. Endurance is the ability to use your muscles for a long time, even after they get tired. Do not start doing these exercises until your health care provider  approves. Shoulder flexion, isometric 1. Stand or sit in a doorway, facing the door frame. 2. Keep your left / right arm straight and make a gentle fist with your hand. Place your fist against the door frame. Only your fist should be touching the frame. Keep your upper arm at your side. 3. Gently press your fist against the door frame, as if you are trying to raise your arm above your head (isometric shoulder flexion). ? Avoid shrugging your shoulder while you press your hand into the door frame. Keep your shoulder blade tucked down toward the middle of your back. 4. Hold for 10 seconds. 5. Slowly release the tension, and relax your muscles completely before you repeat the exercise. Repeat 10 times. Complete this exercise 3 times per week.  Shoulder abduction, isometric 1. Stand or sit in a doorway. Your left / right arm should be closest to the door frame. 2. Keep your left / right arm straight, and place the back of your hand against the door frame. Only your hand should be touching the frame. Keep the rest of your arm close to your side. 3. Gently press the back of your hand against the door frame, as if you are trying to raise your arm out to the side (isometric shoulder abduction). ? Avoid shrugging your shoulder while you press your hand into the door frame. Keep your shoulder blade tucked down toward the middle of your back. 4. Hold for 10 seconds. 5. Slowly release the tension, and relax your muscles completely before you repeat the exercise. Repeat 10 times. Complete this exercise 3 times per week.      Internal rotation, isometric This is an exercise in which you press your palm against a door frame without moving your shoulder joint (isometric). 1. Stand or sit in a doorway, facing the door frame. 2. Bend your left / right elbow, and place the palm of your hand against the door frame. Only your palm should be touching the frame. Keep your upper arm at your side. 3. Gently press  your hand against the door frame, as if you are trying to push your arm toward your abdomen (internal rotation). Gradually increase the pressure until you are pressing as hard as you can. Stop increasing the pressure if you feel shoulder pain. ? Avoid shrugging your shoulder while you press your hand into the door frame. Keep your shoulder blade tucked down toward the middle of your back. 4. Hold for 10 seconds. 5. Slowly release the tension, and relax your muscles completely before you repeat the exercise. Repeat 10 times. Complete this exercise 3 times per week.      External rotation, isometric This is an exercise in which you press the back of your wrist against a door frame without moving your shoulder joint (isometric). 1. Stand or sit in a doorway, facing the door frame. 2. Bend your left / right elbow and place the back of your wrist against the door frame. Only the back of your wrist should be touching the frame. Keep your upper arm at your side. 3. Gently press your wrist against the door frame, as if you are trying to push your arm away from your abdomen (external rotation). Gradually increase the pressure until you are pressing as hard as you can. Stop increasing the pressure if you feel pain. ? Avoid shrugging your shoulder while you press your wrist into the door frame. Keep your shoulder blade tucked down toward the middle of your back. 4. Hold for 10 seconds. 5. Slowly release the tension, and relax your muscles completely before you repeat the exercise. Repeat 10 times. Complete this exercise 3 times per week.       Scapular retraction 1. Sit in a stable chair without armrests, or stand up. 2. Secure an exercise band to a stable object in front of you so the band is at shoulder height. 3. Hold one end of the exercise band in each hand. Your palms should face down. 4. Squeeze your shoulder blades together (retraction) and move your elbows slightly behind you. Do not shrug your  shoulders upward while you do this. 5. Hold for 10 seconds. 6. Slowly return to the starting position. Repeat 10 times. Complete this exercise 3 times per week.  Shoulder extension 1. Sit in a stable chair without armrests, or stand up. 2. Secure an exercise band to a stable object in front of you so the band is above shoulder height. 3. Hold one end of the exercise band in each hand. 4. Straighten your elbows and lift your hands up to shoulder height. 5. Squeeze your shoulder blades together and pull your hands down to the sides of your thighs (extension). Stop when your hands are straight down by your sides. Do not let your hands go behind your body. 6. Hold for 10 seconds. 7. Slowly return to the starting position. Repeat 10 times. Complete this exercise 3 times per week.       Scapular protraction, supine 1. Lie on your back on a firm surface (supine position). Hold a 5 lbs (or soup can) weight in your left / right hand. 2. Raise your left / right arm straight into the air so your hand is directly above your shoulder joint. 3. Push the weight into the air so your shoulder (scapula) lifts off the surface that you are lying on. The scapula will push up or forward (protraction). Do not move your head, neck, or back. 4. Hold for 10 seconds. 5. Slowly return to the starting position. Let your muscles relax completely before you repeat this exercise.  Repeat 10 times. Complete this exercise 3 times per week.

## 2020-12-12 ENCOUNTER — Ambulatory Visit
Admission: EM | Admit: 2020-12-12 | Discharge: 2020-12-12 | Disposition: A | Discharge status: Home / Self Care | Payer: Medicare Other | Attending: Emergency Medicine | Admitting: Emergency Medicine

## 2020-12-12 ENCOUNTER — Other Ambulatory Visit: Payer: Self-pay

## 2020-12-12 DIAGNOSIS — T148XXA Other injury of unspecified body region, initial encounter: Secondary | ICD-10-CM | POA: Diagnosis not present

## 2020-12-12 MED ORDER — DOXYCYCLINE HYCLATE 100 MG PO CAPS: 100.0000 mg | ORAL_CAPSULE | Freq: Two times a day (BID) | ORAL | 0 refills | Status: DC

## 2020-12-12 MED ORDER — TETANUS-DIPHTH-ACELL PERTUSSIS 5-2.5-18.5 LF-MCG/0.5 IM SUSY: 0.5000 mL | PREFILLED_SYRINGE | Freq: Once | INTRAMUSCULAR | Status: DC

## 2020-12-12 NOTE — Discharge Instructions (Signed)
  Splinter removed Tetanus updated Wash with warm water and mild soap Antibiotic prescribed to prevent infection Dressing applied Ibuprofen or tylenol as needed for pain Follow up with PCP as needed

## 2020-12-12 NOTE — ED Provider Notes (Signed)
[facility]   [id number] 12/12/20 Arrival Time: 8921   CC: Splinter  SUBJECTIVE:  Nicole Meadows is a 80 y.o. female who presents with a splinter in RT ring finger under nail that occurred last night.  Tried removing last night with tweezers but was unsuccessful. Sore to the touch.  Denies similar symptoms in the past.  Denies fever, chills, nausea, vomiting, redness, swelling.    ROS: As per HPI.  All other pertinent ROS negative.     Past Medical History:  Diagnosis Date  . Anemia   . Arthritis   . Coronary artery disease    moderate, by cath  . Dyslipidemia   . Fibroid   . Glaucoma 09/25/2017   patient denies having this or being diagnosed with this ever  . History of nuclear stress test 05/2010   bruce myoview; small fixed distal anteroapical, apical defect (breast attenuation or apical thinning), no reversible ischemia, post-stress EF 83%, abnormal but low risk study   . Hyperlipidemia   . Hypertension   . Tremor   . Vaginal dryness    Past Surgical History:  Procedure Laterality Date  . ABDOMINAL HYSTERECTOMY  1995  . BREAST SURGERY     Biopsy negative  . CARDIAC CATHETERIZATION  05/31/2007   no signficant CAD, low normal EF (Dr. Gerrie Nordmann)   . CATARACT EXTRACTION W/PHACO Right 03/02/2015   Procedure: CATARACT EXTRACTION PHACO AND INTRAOCULAR LENS PLACEMENT RIGHT EYE CDE=5.78;  Surgeon: [name], MD;  Location: [facility];  Service: Ophthalmology;  Laterality: Right;  . CATARACT EXTRACTION W/PHACO Left 03/19/2015   Procedure: CATARACT EXTRACTION PHACO AND INTRAOCULAR LENS PLACEMENT LEFT EYE CDE=5.44;  Surgeon: [name], MD;  Location: [facility];  Service: Ophthalmology;  Laterality: Left;  . TRANSTHORACIC ECHOCARDIOGRAM  2005   EF 60% with borderline conc LVH; mild TR, RVSP 56mmHg   . TUBAL LIGATION  1977   Allergies  Allergen Reactions  . Hydrocodone Nausea And Vomiting  . Zocor [Simvastatin]     myalgias  . Latex Rash  . Penicillins Rash    Has  patient had a PCN reaction causing immediate rash, facial/tongue/throat swelling, SOB or lightheadedness with hypotension: Unknown Has patient had a PCN reaction causing severe rash involving mucus membranes or skin necrosis: Yes Has patient had a PCN reaction that required hospitalization: No Has patient had a PCN reaction occurring within the last 10 years: No If all of the above answers are "NO", then may proceed with Cephalosporin use.   No current facility-administered medications on file prior to encounter.   Current Outpatient Medications on File Prior to Encounter  Medication Sig Dispense Refill  . azelastine (ASTELIN) 137 MCG/SPRAY nasal spray Place 1 spray into the nose at bedtime. Use in each nostril as directed    . bacitracin-polymyxin b (POLYSPORIN) ointment Apply 1 application topically daily. Apply to thumb for cyst daily    . cholecalciferol (VITAMIN D) 1000 units tablet Take 2,000 Units by mouth daily.    . Cinnamon 500 MG capsule Take 500 mg by mouth daily.    . cycloSPORINE (RESTASIS) 0.05 % ophthalmic emulsion Place 1 drop into both eyes 2 (two) times daily.     [name] desonide (DESOWEN) 0.05 % cream Apply topically 2 (two) times daily.    . diclofenac Sodium (VOLTAREN) 1 % GEL Apply topically 4 (four) times daily.    [name] FLAX OIL-FISH OIL-BORAGE OIL PO Take 1 capsule by mouth daily.     . fluticasone (FLONASE) 50  MCG/ACT nasal spray Place 1 spray into both nostrils every morning.     . nitroGLYCERIN (NITROLINGUAL) 0.4 MG/SPRAY spray Place 1 spray under the tongue every 5 (five) minutes x 3 doses as needed for chest pain. 12 g 2  . pantoprazole (PROTONIX) 20 MG tablet Take 1 tablet (20 mg total) by mouth daily. 14 tablet 0  . rosuvastatin (CRESTOR) 5 MG tablet Take 5 mg by mouth every evening.     . sucralfate (CARAFATE) 1 g tablet Take 1 tablet (1 g total) by mouth 4 (four) times daily -  with meals and at bedtime. 28 tablet 0  . telmisartan-hydrochlorothiazide (MICARDIS HCT)  80-12.5 MG per tablet Take 1 tablet by mouth daily. 28 tablet 0   Social History   Socioeconomic History  . Marital status: Married    Spouse name: Not on file  . Number of children: Not on file  . Years of education: Not on file  . Highest education level: Not on file  Occupational History  . Not on file  Tobacco Use  . Smoking status: Never Smoker  . Smokeless tobacco: Never Used  Substance and Sexual Activity  . Alcohol use: No  . Drug use: No  . Sexual activity: Not on file    Comment: Hysterectomy  Other Topics Concern  . Not on file  Social History Narrative  . Not on file   Social Determinants of Health   Financial Resource Strain: Not on file  Food Insecurity: Not on file  Transportation Needs: Not on file  Physical Activity: Not on file  Stress: Not on file  Social Connections: Not on file  Intimate Partner Violence: Not on file   Family History  Problem Relation Age of Onset  . Hypertension Mother   . Sudden death Mother   . Heart disease Father   . Kidney failure Father   . Cancer Sister        uterine  . Heart failure Brother        DM, HTN  . Aneurysm Brother        abdominal; also HTN  . Hypertension Brother        sudden death  . Hypertension Brother   . Hyperlipidemia Brother        also DM  . Hypertension Brother   . Other Brother        2 brain surgeries, bleeding    OBJECTIVE:  Vitals:   12/12/20 1356  BP: 108/61  Pulse: (!) 53  Resp: 18  Temp: 97.9 F (36.6 C)  SpO2: 97%     General appearance: alert; no distress Skin: splinter under third digit fingernail, removed with hemostat, some bleeding afterwards, mildly TTP Psychological: alert and cooperative; normal mood and affect  Procedure: Verbal consent obtained. Using a straight kelly hemostat wooden splinter removed.  Approximately .0.25 cm in length.  Fingernail irrigated with tap water.  Minimal bleeding. Patient tolerated procedure well.  Dressing applied.  No  complications.   ASSESSMENT & PLAN:  1. Splinter in skin     Meds ordered this encounter  Medications  . doxycycline (VIBRAMYCIN) 100 MG capsule    Sig: Take 1 capsule (100 mg total) by mouth 2 (two) times daily.    Dispense:  20 capsule    Refill:  0    Order Specific Question:   Supervising Provider    Answer:   Raylene Everts [[id number]]  . Tdap (BOOSTRIX) injection 0.5 mL   Splinter removed Tetanus  updated Wash with warm water and mild soap Antibiotic prescribed to prevent infection Dressing applied Ibuprofen or tylenol as needed for pain Follow up with PCP as needed  Reviewed expectations re: course of current medical issues. Questions answered. Outlined signs and symptoms indicating need for more acute intervention. Patient verbalized understanding. After Visit Summary given.          Lestine Box, PA-C 12/12/20 1424

## 2020-12-12 NOTE — ED Triage Notes (Signed)
Pt presents with large splinter in right ring finger, under the finger nail

## 2021-01-20 DIAGNOSIS — I10 Essential (primary) hypertension: Secondary | ICD-10-CM | POA: Diagnosis not present

## 2021-02-15 DIAGNOSIS — Z20822 Contact with and (suspected) exposure to covid-19: Secondary | ICD-10-CM | POA: Diagnosis not present

## 2021-03-10 DIAGNOSIS — E782 Mixed hyperlipidemia: Secondary | ICD-10-CM | POA: Diagnosis not present

## 2021-03-10 DIAGNOSIS — R739 Hyperglycemia, unspecified: Secondary | ICD-10-CM | POA: Diagnosis not present

## 2021-03-10 DIAGNOSIS — I10 Essential (primary) hypertension: Secondary | ICD-10-CM | POA: Diagnosis not present

## 2021-03-10 DIAGNOSIS — G259 Extrapyramidal and movement disorder, unspecified: Secondary | ICD-10-CM | POA: Diagnosis not present

## 2021-03-10 DIAGNOSIS — Z8616 Personal history of COVID-19: Secondary | ICD-10-CM | POA: Diagnosis not present

## 2021-03-10 DIAGNOSIS — C259 Malignant neoplasm of pancreas, unspecified: Secondary | ICD-10-CM | POA: Diagnosis not present

## 2021-03-10 DIAGNOSIS — M130 Polyarthritis, unspecified: Secondary | ICD-10-CM | POA: Diagnosis not present

## 2021-03-31 ENCOUNTER — Ambulatory Visit: Payer: Medicare Other | Admitting: Orthopedic Surgery

## 2021-03-31 ENCOUNTER — Ambulatory Visit: Payer: Medicare Other

## 2021-03-31 ENCOUNTER — Encounter: Payer: Self-pay | Admitting: Orthopedic Surgery

## 2021-03-31 ENCOUNTER — Other Ambulatory Visit: Payer: Self-pay

## 2021-03-31 VITALS — BP 136/63 | HR 61 | Ht 65.5 in | Wt 172.0 lb

## 2021-03-31 DIAGNOSIS — M47812 Spondylosis without myelopathy or radiculopathy, cervical region: Secondary | ICD-10-CM

## 2021-03-31 DIAGNOSIS — M792 Neuralgia and neuritis, unspecified: Secondary | ICD-10-CM

## 2021-03-31 DIAGNOSIS — I6522 Occlusion and stenosis of left carotid artery: Secondary | ICD-10-CM

## 2021-03-31 DIAGNOSIS — M542 Cervicalgia: Secondary | ICD-10-CM

## 2021-03-31 MED ORDER — GABAPENTIN 100 MG PO CAPS: 100.0000 mg | ORAL_CAPSULE | Freq: Every day | ORAL | 2 refills | Status: DC

## 2021-03-31 MED ORDER — IBUPROFEN 800 MG PO TABS: 800.0000 mg | ORAL_TABLET | Freq: Three times a day (TID) | ORAL | 1 refills | Status: DC | PRN

## 2021-03-31 MED ORDER — METHOCARBAMOL 500 MG PO TABS: 500.0000 mg | ORAL_TABLET | Freq: Every day | ORAL | 1 refills | Status: DC

## 2021-03-31 NOTE — Progress Notes (Signed)
Chief Complaint  Patient presents with   Neck Pain    States neck painful goes into left arm   80 year old female previously seen in our office for shoulder pain diagnosed with tendinitis of the rotator cuff has a history of neck pain which was evaluated back in 2012 which was secondary to hitting her head that MRI showed some mild disc protrusions nothing serious  She comes in now with worsening neck and shoulder pain which is making it difficult for her to sleep.  The pain is in the cervical spine left side of the neck and radiates down the back of the left arm up to the elbow with occasional numbness in the left hand primarily long finger small and ring finger  Pain is described as dull and constant  There examination shows that she is tender in the midline of the cervical spine down into the upper thoracic spine left side of the cervical spine which is associated with decreased range of motion she has some trapezius tenderness as well shoulder exam is negative  The strength in her upper extremity is normal as are the reflexes and there are no sensory abnormalities  The radiology report from the MRI back in 2012 is included  X-rays from November 18, 2020 left shoulder are reviewed and they are normal   Narrative  *RADIOLOGY REPORT*   Clinical Data: Head, neck and bilateral shoulder pain.  Question C6-  7 disc herniation.   MRI CERVICAL SPINE WITHOUT CONTRAST   Technique:  Multiplanar and multiecho pulse sequences of the  cervical spine, to include the craniocervical junction and  cervicothoracic junction, were obtained according to standard  protocol without intravenous contrast.   Comparison: CT cervical spine 11/05/2010.   Findings: Vertebral body height, signal and alignment are  maintained.  The craniocervical junction is normal and cervical  cord signal is normal.  Visualized paraspinous structures are  unremarkable.   C2-3:  Negative.   C3-4:  Shallow central  protrusion without central canal or  foraminal narrowing.   C4-5:  Mild disc bulge narrows but does not efface the ventral  thecal sac.  Central canal and foramina are open.   C5-6:  Mild disc bulge and uncovertebral disease.  Central canal  and foramina remain open.   C6-7:  Small right paracentral disc protrusion contacts and mildly  deforms the right hemicord.  Central canal appears open overall.  Foramina are patent.   C7-T1:  Negative.   IMPRESSION:   Small right paracentral protrusion at C6-7 contacts  and mildly deforms right hemicord.   Original Report Authenticated By: [id number]    Diagnosis  Encounter Diagnoses  Name Primary?   Radicular pain of left upper extremity    Cervical spondylosis without myelopathy Yes     It seems that her symptoms today are neck symptoms with the possibility of a C7 radicular syndrome without neurologic compromise  Images show mild arthritis of the cervical spine mainly in the uncovertebral joints  Recommend nonoperative treatment  Symptomatic treatment including heat NSAIDs add some gabapentin for the numbness and a muscle relaxer and recommend PT  Follow-up 2 months  Meds ordered this encounter  Medications   gabapentin (NEURONTIN) 100 MG capsule    Sig: Take 1 capsule (100 mg total) by mouth at bedtime. As needed for tingling    Dispense:  90 capsule    Refill:  2   methocarbamol (ROBAXIN) 500 MG tablet    Sig: Take 1 tablet (500 mg total)  by mouth at bedtime.    Dispense:  30 tablet    Refill:  1   ibuprofen (ADVIL) 800 MG tablet    Sig: Take 1 tablet (800 mg total) by mouth every 8 (eight) hours as needed.    Dispense:  90 tablet    Refill:  1

## 2021-03-31 NOTE — Addendum Note (Signed)
Addended byCandice Camp on: 03/31/2021 04:21 PM   Modules accepted: Orders

## 2021-04-13 ENCOUNTER — Ambulatory Visit (HOSPITAL_COMMUNITY): Payer: Medicare PPO | Attending: Orthopedic Surgery

## 2021-04-13 ENCOUNTER — Telehealth (HOSPITAL_COMMUNITY): Payer: Self-pay

## 2021-04-13 ENCOUNTER — Encounter (HOSPITAL_COMMUNITY): Payer: Self-pay

## 2021-04-13 ENCOUNTER — Other Ambulatory Visit: Payer: Self-pay

## 2021-04-13 DIAGNOSIS — M542 Cervicalgia: Secondary | ICD-10-CM | POA: Diagnosis not present

## 2021-04-13 DIAGNOSIS — R293 Abnormal posture: Secondary | ICD-10-CM | POA: Diagnosis not present

## 2021-04-13 NOTE — Therapy (Signed)
[place], [place], [address] Phone: [phone]   Fax:  [phone]  Physical Therapy Evaluation  Patient Details  Name: Nicole Meadows MRN: [id number] Date of Birth: July 08, 1941 Referring Provider (PT): Carole Civil, MD   Encounter Date: 04/13/2021   PT End of Session - 04/13/21 0943     Visit Number 1    Number of Visits 8    Date for PT Re-Evaluation 05/11/21    Authorization Type [employer] Medicare Choice, auth sheet filled out on eval    PT Start Time 0945    PT Stop Time 1030    PT Time Calculation (min) 45 min    Activity Tolerance Patient tolerated treatment well    Behavior During Therapy [facility] for tasks assessed/performed             Past Medical History:  Diagnosis Date   Anemia    Arthritis    Coronary artery disease    moderate, by cath   Dyslipidemia    Fibroid    Glaucoma 09/25/2017   patient denies having this or being diagnosed with this ever   History of nuclear stress test 05/2010   bruce myoview; small fixed distal anteroapical, apical defect (breast attenuation or apical thinning), no reversible ischemia, post-stress EF 83%, abnormal but low risk study    Hyperlipidemia    Hypertension    Tremor    Vaginal dryness     Past Surgical History:  Procedure Laterality Date   ABDOMINAL HYSTERECTOMY  1995   BREAST SURGERY     Biopsy negative   CARDIAC CATHETERIZATION  05/31/2007   no signficant CAD, low normal EF (Dr. Gerrie Nordmann)    CATARACT EXTRACTION W/PHACO Right 03/02/2015   Procedure: CATARACT EXTRACTION PHACO AND INTRAOCULAR LENS PLACEMENT RIGHT EYE CDE=5.78;  Surgeon: [name], MD;  Location: [facility];  Service: Ophthalmology;  Laterality: Right;   CATARACT EXTRACTION W/PHACO Left 03/19/2015   Procedure: CATARACT EXTRACTION PHACO AND INTRAOCULAR LENS PLACEMENT LEFT EYE CDE=5.44;  Surgeon: [name], MD;  Location: [facility];  Service: Ophthalmology;  Laterality: Left;    TRANSTHORACIC ECHOCARDIOGRAM  2005   EF 60% with borderline conc LVH; mild TR, RVSP 70mmHg    TUBAL LIGATION  1977    There were no vitals filed for this visit.    Subjective Assessment - 04/13/21 0947     Subjective Left side neck and left shoulder pain x 6 months of insidious onset and reports hx of left shoulder and neck arthritis. Increased pain when sleeping at night where she is a side sleeper. Pain runs along lateral neck and lateral left arm above the elbow    Currently in Pain? Yes    Pain Score 8    at night when trying to sleep   Pain Location Neck    Pain Orientation Left    Pain Descriptors / Indicators Aching;Dull    Pain Type Chronic pain    Pain Radiating Towards left shoulder, sometimes hand    Pain Onset More than a month ago    Aggravating Factors  night time                [facility] PT Assessment - 04/13/21 0001       Assessment   Medical Diagnosis ervical spondylosis without myelopathy    Referring Provider (PT) Carole Civil, MD      Balance Screen   Has the patient fallen in the past 6  months No    Has the patient had a decrease in activity level because of a fear of falling?  No    Is the patient reluctant to leave their home because of a fear of falling?  No      Prior Function   Vocation Retired    Leisure reading, puzzles, walking      Observation/Other Assessments   Focus on Therapeutic Outcomes (FOTO)  54% function      Sensation   Light Touch Appears Intact      Posture/Postural Control   Posture/Postural Control Postural limitations    Postural Limitations Rounded Shoulders;Forward head      ROM / Strength   AROM / PROM / Strength AROM;Strength      AROM   AROM Assessment Site Cervical;Shoulder    Right/Left Shoulder Left    Left Shoulder Flexion 150 Degrees    Left Shoulder ABduction 150 Degrees    Left Shoulder External Rotation 70 Degrees    Cervical Flexion WNL    Cervical Extension 10% limited    Cervical - Right  Side Bend WNL    Cervical - Left Side Bend 25% limited, painful    Cervical - Right Rotation 15% limited    Cervical - Left Rotation 50% limited                        Objective measurements completed on examination: See above findings.       [facility] Adult PT Treatment/Exercise - 04/13/21 0001       Exercises   Exercises Shoulder;Neck      Neck Exercises: Seated   Neck Retraction 10 reps    W Back 10 reps      Neck Exercises: Supine   Neck Retraction 10 reps      Shoulder Exercises: Stretch   Wall Stretch - Flexion 5 reps;10 seconds                     PT Education - 04/13/21 1021     Education Details education on assessment findings, recommendation for cervical lordosis support (Towel), HEP    Person(s) Educated Patient    Methods Explanation;Handout    Comprehension Verbalized understanding              PT Short Term Goals - 04/13/21 1025       PT SHORT TERM GOAL #1   Title Patient will be independent with HEP in order to improve functional outcomes.    Time 2    Period Weeks    Status New    Target Date 04/27/21      PT SHORT TERM GOAL #2   Title Pt will rate neck/shoulder pain not exceeding 5/10 to enable improved quality of sleep    Baseline 8/10    Time 2    Period Weeks    Status New    Target Date 04/27/21      PT SHORT TERM GOAL #3   Title Improve left cervical rotation to 25% restricted to improve safety/comfort when driving    Baseline 77% limit    Time 2    Period Weeks    Status New    Target Date 04/27/21               PT Long Term Goals - 04/13/21 1026       PT LONG TERM GOAL #1   Title Patient will improve FOTO score by at  least 5 points in order to indicate improved tolerance to activity.    Baseline 58% function    Time 4    Period Weeks    Status New    Target Date 05/11/21      PT LONG TERM GOAL #2   Title Pain-free left cervical rotation to improve safety/comfort when driving     Baseline 8/10 pain    Time 4    Period Weeks    Status New    Target Date 05/11/21                    Plan - 04/13/21 1022     Clinical Impression Statement Pt is 80 yo lady suffering from left side neck pain and left shoulder pain which increases at night and interferes with comfort and quality of sleep.  Demonstrates ROM restrictions in cervical spine and left shoulder lack left rotation and left lateral flexion with reports of difficulty when driving. Activity restrictions identiftied per [employer] Would benefit from PT services to reduce neck pain, improve posture to reduce pain/dysfunction, and develop/instruct in self-mgmt strategies.    Personal Factors and Comorbidities Age;Time since onset of injury/illness/exacerbation    Examination-Activity Limitations Carry;Lift;Sleep    Examination-Participation Restrictions Cleaning;Yard Work;Driving    Stability/Clinical Decision Making Stable/Uncomplicated    Clinical Decision Making Low    Rehab Potential Good    PT Frequency 2x / week    PT Duration 4 weeks    PT Treatment/Interventions ADLs/Self Care Home Management;Electrical Stimulation;Ultrasound;Traction;Gait training;Functional mobility training;Therapeutic activities;Therapeutic exercise;Neuromuscular re-education;Dry needling;Manual techniques;Passive range of motion;Taping;Spinal Manipulations;Joint Manipulations    PT Next Visit Plan postural re-education, STM, joint mobilizations?    Consulted and Agree with Plan of Care Patient             Patient will benefit from skilled therapeutic intervention in order to improve the following deficits and impairments:  Decreased range of motion, Hypomobility, Increased muscle spasms, Pain, Postural dysfunction  Visit Diagnosis: Neck pain  Abnormal posture     Problem List Patient Active Problem List   Diagnosis Date Noted   Gastroesophageal reflux disease without esophagitis 09/15/2020   Seasonal allergic  rhinitis 09/15/2020   Sore throat 09/15/2020   Bilateral carotid artery disease ([place]) 09/13/2018   Vaginitis and vulvovaginitis 09/07/2018   Arthritis 09/07/2018   Coronary artery calcification 08/29/2016   Dyspnea 08/29/2016   Mixed hyperlipidemia 08/29/2016   Coronary artery disease involving native coronary artery of native heart with angina pectoris ([place]) 08/29/2016   Obesity (BMI 30-39.9) 08/25/2015   Essential hypertension 07/08/2013   Dyslipidemia 07/08/2013   Tremor     Toniann Fail, PT 04/13/2021, 10:28 AM  [facility] [address] [place], [place], [address] Phone: [phone]   Fax:  [phone]  Name: Nicole Meadows MRN: [id number] Date of Birth: 1940/09/28

## 2021-04-13 NOTE — Patient Instructions (Signed)
Access Code: [email or web address] URL: [email or web address] Date: 04/13/2021 Prepared by: Sherlyn Lees  Exercises Supine Cervical Retraction with Towel - 1 x daily - 7 x weekly - 1-3 sets - 10 reps Seated Cervical Retraction - 1 x daily - 7 x weekly - 1-3 sets - 10 reps - 3 sec hold Standing Scapular Retraction - 1 x daily - 7 x weekly - 1-3 sets - 10 reps - 3 sec hold Standing shoulder flexion wall slides - 1 x daily - 7 x weekly - 3 sets - 10 reps

## 2021-04-13 NOTE — Telephone Encounter (Signed)
Pt can not come in at 4pm on 9/21 - she did not agree to change this apptment - pls cx

## 2021-04-14 ENCOUNTER — Encounter (HOSPITAL_COMMUNITY): Payer: Medicare PPO

## 2021-04-19 ENCOUNTER — Ambulatory Visit (HOSPITAL_COMMUNITY): Payer: Medicare PPO

## 2021-04-19 ENCOUNTER — Other Ambulatory Visit: Payer: Self-pay

## 2021-04-19 DIAGNOSIS — R293 Abnormal posture: Secondary | ICD-10-CM | POA: Diagnosis not present

## 2021-04-19 DIAGNOSIS — M542 Cervicalgia: Secondary | ICD-10-CM

## 2021-04-19 NOTE — Therapy (Signed)
[place], [place], [address] Phone: [phone]   Fax:  [phone]  Physical Therapy Treatment  Patient Details  Name: Nicole Meadows MRN: [id number] Date of Birth: 11-Sep-1940 Referring Provider (PT): Carole Civil, MD   Encounter Date: 04/19/2021   PT End of Session - 04/19/21 1118     Visit Number 2    Number of Visits 8    Date for PT Re-Evaluation 05/11/21    Authorization Type [employer] Choice, auth sheet filled out on eval    PT Start Time 1116    PT Stop Time 1200    PT Time Calculation (min) 44 min    Activity Tolerance Patient tolerated treatment well    Behavior During Therapy [facility] for tasks assessed/performed             Past Medical History:  Diagnosis Date   Anemia    Arthritis    Coronary artery disease    moderate, by cath   Dyslipidemia    Fibroid    Glaucoma 09/25/2017   patient denies having this or being diagnosed with this ever   History of nuclear stress test 05/2010   bruce myoview; small fixed distal anteroapical, apical defect (breast attenuation or apical thinning), no reversible ischemia, post-stress EF 83%, abnormal but low risk study    Hyperlipidemia    Hypertension    Tremor    Vaginal dryness     Past Surgical History:  Procedure Laterality Date   ABDOMINAL HYSTERECTOMY  1995   BREAST SURGERY     Biopsy negative   CARDIAC CATHETERIZATION  05/31/2007   no signficant CAD, low normal EF (Dr. Gerrie Nordmann)    CATARACT EXTRACTION W/PHACO Right 03/02/2015   Procedure: CATARACT EXTRACTION PHACO AND INTRAOCULAR LENS PLACEMENT RIGHT EYE CDE=5.78;  Surgeon: [name], MD;  Location: [facility];  Service: Ophthalmology;  Laterality: Right;   CATARACT EXTRACTION W/PHACO Left 03/19/2015   Procedure: CATARACT EXTRACTION PHACO AND INTRAOCULAR LENS PLACEMENT LEFT EYE CDE=5.44;  Surgeon: [name], MD;  Location: [facility];  Service: Ophthalmology;  Laterality: Left;    TRANSTHORACIC ECHOCARDIOGRAM  2005   EF 60% with borderline conc LVH; mild TR, RVSP 54mmHg    TUBAL LIGATION  1977    There were no vitals filed for this visit.       [facility] PT Assessment - 04/19/21 0001       Assessment   Medical Diagnosis ervical spondylosis without myelopathy    Referring Provider (PT) Carole Civil, MD                           [facility] Adult PT Treatment/Exercise - 04/19/21 0001       Neck Exercises: Standing   Other Standing Exercises chin retraction hold with bicep curl: 4 lbs 3x10    Other Standing Exercises bent over row using counter for support: 3x10 3 lbs      Neck Exercises: Seated   Neck Retraction 10 reps    Cervical Rotation Both;10 reps    Lateral Flexion Both;10 reps    W Back 10 reps      Neck Exercises: Stretches   Corner Stretch 2 reps;30 seconds                     PT Education - 04/19/21 1127     Education Details discussion on various pillows for sleeping posture  Person(s) Educated Patient              PT Short Term Goals - 04/13/21 1025       PT SHORT TERM GOAL #1   Title Patient will be independent with HEP in order to improve functional outcomes.    Time 2    Period Weeks    Status New    Target Date 04/27/21      PT SHORT TERM GOAL #2   Title Pt will rate neck/shoulder pain not exceeding 5/10 to enable improved quality of sleep    Baseline 8/10    Time 2    Period Weeks    Status New    Target Date 04/27/21      PT SHORT TERM GOAL #3   Title Improve left cervical rotation to 25% restricted to improve safety/comfort when driving    Baseline 78% limit    Time 2    Period Weeks    Status New    Target Date 04/27/21               PT Long Term Goals - 04/13/21 1026       PT LONG TERM GOAL #1   Title Patient will improve FOTO score by at least 5 points in order to indicate improved tolerance to activity.    Baseline 58% function    Time 4    Period Weeks     Status New    Target Date 05/11/21      PT LONG TERM GOAL #2   Title Pain-free left cervical rotation to improve safety/comfort when driving    Baseline 2/95 pain    Time 4    Period Weeks    Status New    Target Date 05/11/21                   Plan - 04/19/21 1157     Clinical Impression Statement Cues for minimizng upper trap compensation with lifting and stretching manuevers. Demonstrates left shoulder pain with resisted flexion/abduction/scaption. Pain with Neer's and Hawkin's Kennedy maneuver. Reports centralization of neck pain to central, lower c-spine and less radiating pain into left shoulder/arm.  Continued sessions indicated to progress c-spine ROM, stretching, postural awareness and UE strengthening/body mechanics    Personal Factors and Comorbidities Age;Time since onset of injury/illness/exacerbation    Examination-Activity Limitations Carry;Lift;Sleep    Examination-Participation Restrictions Cleaning;Yard Work;Driving    Stability/Clinical Decision Making Stable/Uncomplicated    Rehab Potential Good    PT Frequency 2x / week    PT Duration 4 weeks    PT Treatment/Interventions ADLs/Self Care Home Management;Electrical Stimulation;Ultrasound;Traction;Gait training;Functional mobility training;Therapeutic activities;Therapeutic exercise;Neuromuscular re-education;Dry needling;Manual techniques;Passive range of motion;Taping;Spinal Manipulations;Joint Manipulations    PT Next Visit Plan postural re-education, STM, joint mobilizations?    Consulted and Agree with Plan of Care Patient             Patient will benefit from skilled therapeutic intervention in order to improve the following deficits and impairments:  Decreased range of motion, Hypomobility, Increased muscle spasms, Pain, Postural dysfunction  Visit Diagnosis: Neck pain  Abnormal posture     Problem List Patient Active Problem List   Diagnosis Date Noted   Gastroesophageal reflux disease  without esophagitis 09/15/2020   Seasonal allergic rhinitis 09/15/2020   Sore throat 09/15/2020   Bilateral carotid artery disease ([place]) 09/13/2018   Vaginitis and vulvovaginitis 09/07/2018   Arthritis 09/07/2018   Coronary artery calcification 08/29/2016   Dyspnea 08/29/2016  Mixed hyperlipidemia 08/29/2016   Coronary artery disease involving native coronary artery of native heart with angina pectoris ([place]) 08/29/2016   Obesity (BMI 30-39.9) 08/25/2015   Essential hypertension 07/08/2013   Dyslipidemia 07/08/2013   Tremor     Toniann Fail, PT 04/19/2021, 12:00 PM  [facility] [address] [place], [place], [address] Phone: [phone]   Fax:  [phone]  Name: SHEKERA BEAVERS MRN: [id number] Date of Birth: 1940-11-26

## 2021-04-21 ENCOUNTER — Other Ambulatory Visit: Payer: Self-pay

## 2021-04-21 ENCOUNTER — Ambulatory Visit (HOSPITAL_COMMUNITY): Payer: Medicare PPO

## 2021-04-21 DIAGNOSIS — R293 Abnormal posture: Secondary | ICD-10-CM

## 2021-04-21 DIAGNOSIS — M542 Cervicalgia: Secondary | ICD-10-CM | POA: Diagnosis not present

## 2021-04-21 NOTE — Therapy (Signed)
[place], [place], [address] Phone: [phone]   Fax:  [phone]  Physical Therapy Treatment  Patient Details  Name: Nicole Meadows MRN: [id number] Date of Birth: 1941-04-05 Referring Provider (PT): Carole Civil, MD   Encounter Date: 04/21/2021   PT End of Session - 04/21/21 0904     Visit Number 3    Number of Visits 8    Date for PT Re-Evaluation 05/11/21    Authorization Type [employer] Choice, auth sheet filled out on eval    PT Start Time 0900    PT Stop Time 0945    PT Time Calculation (min) 45 min    Activity Tolerance Patient tolerated treatment well    Behavior During Therapy [facility] for tasks assessed/performed             Past Medical History:  Diagnosis Date   Anemia    Arthritis    Coronary artery disease    moderate, by cath   Dyslipidemia    Fibroid    Glaucoma 09/25/2017   patient denies having this or being diagnosed with this ever   History of nuclear stress test 05/2010   bruce myoview; small fixed distal anteroapical, apical defect (breast attenuation or apical thinning), no reversible ischemia, post-stress EF 83%, abnormal but low risk study    Hyperlipidemia    Hypertension    Tremor    Vaginal dryness     Past Surgical History:  Procedure Laterality Date   ABDOMINAL HYSTERECTOMY  1995   BREAST SURGERY     Biopsy negative   CARDIAC CATHETERIZATION  05/31/2007   no signficant CAD, low normal EF (Dr. Gerrie Nordmann)    CATARACT EXTRACTION W/PHACO Right 03/02/2015   Procedure: CATARACT EXTRACTION PHACO AND INTRAOCULAR LENS PLACEMENT RIGHT EYE CDE=5.78;  Surgeon: [name], MD;  Location: [facility];  Service: Ophthalmology;  Laterality: Right;   CATARACT EXTRACTION W/PHACO Left 03/19/2015   Procedure: CATARACT EXTRACTION PHACO AND INTRAOCULAR LENS PLACEMENT LEFT EYE CDE=5.44;  Surgeon: [name], MD;  Location: [facility];  Service: Ophthalmology;  Laterality: Left;    TRANSTHORACIC ECHOCARDIOGRAM  2005   EF 60% with borderline conc LVH; mild TR, RVSP 32mmHg    TUBAL LIGATION  1977    There were no vitals filed for this visit.   Subjective Assessment - 04/21/21 0907     Subjective Feeling somewhat better with decrease in strain feeling. Trying different pillows for sleeping. Shoulder is not too sore from strengthening    Currently in Pain? Yes    Pain Score 4    4-5 pain during the day, 7-8/10 at night in neck and shoulder   Pain Location Neck    Pain Orientation Left    Pain Type Chronic pain    Pain Onset More than a month ago                [facility] PT Assessment - 04/21/21 0001       Assessment   Medical Diagnosis cervical spondylosis without myelopathy                           [facility] Adult PT Treatment/Exercise - 04/21/21 0001       Neck Exercises: Standing   Neck Retraction 10 reps    Other Standing Exercises chin retraction hold with bicep curl: 4 lbs 3x10    Other Standing Exercises bent over row using counter  for support: 3x10 3 lbs      Neck Exercises: Stretches   Upper Trapezius Stretch Right;Left;3 reps;30 seconds    Corner Stretch 3 reps;30 seconds      Shoulder Exercises: Standing   Horizontal ABduction Strengthening;Both;20 reps;Theraband    Theraband Level (Shoulder Horizontal ABduction) Level 2 (Red)    External Rotation Strengthening;Both;20 reps;Theraband    Theraband Level (Shoulder External Rotation) Level 2 (Red)                     PT Education - 04/21/21 0939     Education Details discussion regarding program structure and repetitions e.g. postural exercises vs strengthening    Person(s) Educated Patient    Methods Explanation;Handout    Comprehension Verbalized understanding              PT Short Term Goals - 04/13/21 1025       PT SHORT TERM GOAL #1   Title Patient will be independent with HEP in order to improve functional outcomes.    Time 2    Period Weeks     Status New    Target Date 04/27/21      PT SHORT TERM GOAL #2   Title Pt will rate neck/shoulder pain not exceeding 5/10 to enable improved quality of sleep    Baseline 8/10    Time 2    Period Weeks    Status New    Target Date 04/27/21      PT SHORT TERM GOAL #3   Title Improve left cervical rotation to 25% restricted to improve safety/comfort when driving    Baseline 31% limit    Time 2    Period Weeks    Status New    Target Date 04/27/21               PT Long Term Goals - 04/13/21 1026       PT LONG TERM GOAL #1   Title Patient will improve FOTO score by at least 5 points in order to indicate improved tolerance to activity.    Baseline 58% function    Time 4    Period Weeks    Status New    Target Date 05/11/21      PT LONG TERM GOAL #2   Title Pain-free left cervical rotation to improve safety/comfort when driving    Baseline 5/40 pain    Time 4    Period Weeks    Status New    Target Date 05/11/21                   Plan - 04/21/21 0931     Clinical Impression Statement Progressing with POC details and demonstrating good compliance with HEP for postural re-education and shoulder/rotator cuff strengthning. Visual and tactile cues still needed for form and to correct compensation. Continued sessions indicated to progress HEP and improve postural alignment and develop strategies to improve comfort for sleep to decrease pain    Personal Factors and Comorbidities Age;Time since onset of injury/illness/exacerbation    Examination-Activity Limitations Carry;Lift;Sleep    Examination-Participation Restrictions Cleaning;Yard Work;Driving    Stability/Clinical Decision Making Stable/Uncomplicated    Rehab Potential Good    PT Frequency 2x / week    PT Duration 4 weeks    PT Treatment/Interventions ADLs/Self Care Home Management;Electrical Stimulation;Ultrasound;Traction;Gait training;Functional mobility training;Therapeutic activities;Therapeutic  exercise;Neuromuscular re-education;Dry needling;Manual techniques;Passive range of motion;Taping;Spinal Manipulations;Joint Manipulations    PT Next Visit Plan postural re-education, STM, joint  mobilizations?    Consulted and Agree with Plan of Care Patient             Patient will benefit from skilled therapeutic intervention in order to improve the following deficits and impairments:  Decreased range of motion, Hypomobility, Increased muscle spasms, Pain, Postural dysfunction  Visit Diagnosis: Neck pain  Abnormal posture     Problem List Patient Active Problem List   Diagnosis Date Noted   Gastroesophageal reflux disease without esophagitis 09/15/2020   Seasonal allergic rhinitis 09/15/2020   Sore throat 09/15/2020   Bilateral carotid artery disease ([place]) 09/13/2018   Vaginitis and vulvovaginitis 09/07/2018   Arthritis 09/07/2018   Coronary artery calcification 08/29/2016   Dyspnea 08/29/2016   Mixed hyperlipidemia 08/29/2016   Coronary artery disease involving native coronary artery of native heart with angina pectoris ([place]) 08/29/2016   Obesity (BMI 30-39.9) 08/25/2015   Essential hypertension 07/08/2013   Dyslipidemia 07/08/2013   Tremor     Toniann Fail, PT 04/21/2021, 9:40 AM  [facility] [address] [place], [place], [address] Phone: [phone]   Fax:  [phone]  Name: RHANDA LEMIRE MRN: [id number] Date of Birth: 1941/07/22

## 2021-04-22 DIAGNOSIS — G8929 Other chronic pain: Secondary | ICD-10-CM | POA: Diagnosis not present

## 2021-04-22 DIAGNOSIS — J301 Allergic rhinitis due to pollen: Secondary | ICD-10-CM | POA: Diagnosis not present

## 2021-04-22 DIAGNOSIS — E785 Hyperlipidemia, unspecified: Secondary | ICD-10-CM | POA: Diagnosis not present

## 2021-04-22 DIAGNOSIS — Z20822 Contact with and (suspected) exposure to covid-19: Secondary | ICD-10-CM | POA: Diagnosis not present

## 2021-04-22 DIAGNOSIS — Z79899 Other long term (current) drug therapy: Secondary | ICD-10-CM | POA: Diagnosis not present

## 2021-04-22 DIAGNOSIS — K219 Gastro-esophageal reflux disease without esophagitis: Secondary | ICD-10-CM | POA: Diagnosis not present

## 2021-04-22 DIAGNOSIS — M792 Neuralgia and neuritis, unspecified: Secondary | ICD-10-CM | POA: Diagnosis not present

## 2021-04-22 DIAGNOSIS — Z791 Long term (current) use of non-steroidal anti-inflammatories (NSAID): Secondary | ICD-10-CM | POA: Diagnosis not present

## 2021-04-22 DIAGNOSIS — I10 Essential (primary) hypertension: Secondary | ICD-10-CM | POA: Diagnosis not present

## 2021-04-23 ENCOUNTER — Encounter (HOSPITAL_COMMUNITY): Payer: Medicare PPO

## 2021-04-26 ENCOUNTER — Ambulatory Visit (HOSPITAL_COMMUNITY): Payer: Medicare PPO | Attending: Orthopedic Surgery

## 2021-04-26 ENCOUNTER — Other Ambulatory Visit: Payer: Self-pay

## 2021-04-26 ENCOUNTER — Encounter (HOSPITAL_COMMUNITY): Payer: Self-pay

## 2021-04-26 DIAGNOSIS — M542 Cervicalgia: Secondary | ICD-10-CM | POA: Diagnosis not present

## 2021-04-26 DIAGNOSIS — R293 Abnormal posture: Secondary | ICD-10-CM | POA: Insufficient documentation

## 2021-04-26 NOTE — Therapy (Signed)
[place], [place], [address] Phone: [phone]   Fax:  [phone]  Physical Therapy Treatment  Patient Details  Name: Nicole Meadows MRN: [id number] Date of Birth: 1941/04/27 Referring Provider (PT): Carole Civil, MD   Encounter Date: 04/26/2021   PT End of Session - 04/26/21 1034     Visit Number 4    Number of Visits 8    Date for PT Re-Evaluation 05/11/21    Authorization Type [employer] Choice, auth sheet filled out on eval    PT Start Time 0945    PT Stop Time 1030    PT Time Calculation (min) 45 min    Activity Tolerance Patient tolerated treatment well    Behavior During Therapy [facility] for tasks assessed/performed             Past Medical History:  Diagnosis Date   Anemia    Arthritis    Coronary artery disease    moderate, by cath   Dyslipidemia    Fibroid    Glaucoma 09/25/2017   patient denies having this or being diagnosed with this ever   History of nuclear stress test 05/2010   bruce myoview; small fixed distal anteroapical, apical defect (breast attenuation or apical thinning), no reversible ischemia, post-stress EF 83%, abnormal but low risk study    Hyperlipidemia    Hypertension    Tremor    Vaginal dryness     Past Surgical History:  Procedure Laterality Date   ABDOMINAL HYSTERECTOMY  1995   BREAST SURGERY     Biopsy negative   CARDIAC CATHETERIZATION  05/31/2007   no signficant CAD, low normal EF (Dr. Gerrie Nordmann)    CATARACT EXTRACTION W/PHACO Right 03/02/2015   Procedure: CATARACT EXTRACTION PHACO AND INTRAOCULAR LENS PLACEMENT RIGHT EYE CDE=5.78;  Surgeon: [name], MD;  Location: [facility];  Service: Ophthalmology;  Laterality: Right;   CATARACT EXTRACTION W/PHACO Left 03/19/2015   Procedure: CATARACT EXTRACTION PHACO AND INTRAOCULAR LENS PLACEMENT LEFT EYE CDE=5.44;  Surgeon: [name], MD;  Location: [facility];  Service: Ophthalmology;  Laterality: Left;    TRANSTHORACIC ECHOCARDIOGRAM  2005   EF 60% with borderline conc LVH; mild TR, RVSP 70mmHg    TUBAL LIGATION  1977    There were no vitals filed for this visit.   Subjective Assessment - 04/26/21 0958     Subjective Pt notes increased left shoulder pain especially when reaching overhead and lowering items down (e.g. getting dishes from cupboard)    Currently in Pain? Yes    Pain Score 7    left shoulder when using UE   Pain Location Shoulder    Pain Orientation Left    Pain Descriptors / Indicators Aching;Sore    Pain Type Chronic pain    Pain Onset More than a month ago                               [facility] Adult PT Treatment/Exercise - 04/26/21 0001       Manual Therapy   Manual Therapy Soft tissue mobilization;Taping    Manual therapy comments manual therapy intervention completed seperately from all other interventions this date    Soft tissue mobilization left upper trap/scapular muscles to reduce spasm/pain using massage gun, poorly tolerated. Continued with joint mobilization to left shoulder with grade 3-4 traction/distration oscialltions to reduce pain    Kinesiotex Facilitate Muscle  Kinesiotix   Facilitate Muscle  left supraspinatus/impingement technique                     PT Education - 04/26/21 1035     Education Details modified POC to reduce strengthening to LUE to see if inflammation decreases    Person(s) Educated Patient    Methods Explanation    Comprehension Verbalized understanding              PT Short Term Goals - 04/13/21 1025       PT SHORT TERM GOAL #1   Title Patient will be independent with HEP in order to improve functional outcomes.    Time 2    Period Weeks    Status New    Target Date 04/27/21      PT SHORT TERM GOAL #2   Title Pt will rate neck/shoulder pain not exceeding 5/10 to enable improved quality of sleep    Baseline 8/10    Time 2    Period Weeks    Status New    Target Date  04/27/21      PT SHORT TERM GOAL #3   Title Improve left cervical rotation to 25% restricted to improve safety/comfort when driving    Baseline 38% limit    Time 2    Period Weeks    Status New    Target Date 04/27/21               PT Long Term Goals - 04/13/21 1026       PT LONG TERM GOAL #1   Title Patient will improve FOTO score by at least 5 points in order to indicate improved tolerance to activity.    Baseline 58% function    Time 4    Period Weeks    Status New    Target Date 05/11/21      PT LONG TERM GOAL #2   Title Pain-free left cervical rotation to improve safety/comfort when driving    Baseline 1/82 pain    Time 4    Period Weeks    Status New    Target Date 05/11/21                   Plan - 04/26/21 1034     Clinical Impression Statement left shoulder painful arc, painful resisted abduction/scaption. Tenderness to palpation supraspinous/upper trap. Decreased pain with overhead movements LUE following manual. Discussed with pt stopping UE strengthening at this time to see if shoulder becomes less inflamed. Continued sessions to improve left shoulder pain and reduce left side neck pain    Personal Factors and Comorbidities Age;Time since onset of injury/illness/exacerbation    Examination-Activity Limitations Carry;Lift;Sleep    Examination-Participation Restrictions Cleaning;Yard Work;Driving    Stability/Clinical Decision Making Stable/Uncomplicated    Rehab Potential Good    PT Frequency 2x / week    PT Duration 4 weeks    PT Treatment/Interventions ADLs/Self Care Home Management;Electrical Stimulation;Ultrasound;Traction;Gait training;Functional mobility training;Therapeutic activities;Therapeutic exercise;Neuromuscular re-education;Dry needling;Manual techniques;Passive range of motion;Taping;Spinal Manipulations;Joint Manipulations    PT Next Visit Plan postural re-education, STM, joint mobilizations?    Consulted and Agree with Plan of  Care Patient             Patient will benefit from skilled therapeutic intervention in order to improve the following deficits and impairments:  Decreased range of motion, Hypomobility, Increased muscle spasms, Pain, Postural dysfunction  Visit Diagnosis: Neck pain  Abnormal posture  Problem List Patient Active Problem List   Diagnosis Date Noted   Gastroesophageal reflux disease without esophagitis 09/15/2020   Seasonal allergic rhinitis 09/15/2020   Sore throat 09/15/2020   Bilateral carotid artery disease ([place]) 09/13/2018   Vaginitis and vulvovaginitis 09/07/2018   Arthritis 09/07/2018   Coronary artery calcification 08/29/2016   Dyspnea 08/29/2016   Mixed hyperlipidemia 08/29/2016   Coronary artery disease involving native coronary artery of native heart with angina pectoris ([place]) 08/29/2016   Obesity (BMI 30-39.9) 08/25/2015   Essential hypertension 07/08/2013   Dyslipidemia 07/08/2013   Tremor     Toniann Fail, PT 04/26/2021, 10:36 AM  [place] [address] [place], [place], [address] Phone: [phone]   Fax:  [phone]  Name: DEBORAHA GOAR MRN: [id number] Date of Birth: March 20, 1941

## 2021-04-28 ENCOUNTER — Ambulatory Visit (HOSPITAL_COMMUNITY): Payer: Medicare PPO

## 2021-04-28 ENCOUNTER — Encounter (HOSPITAL_COMMUNITY): Payer: Self-pay

## 2021-04-28 ENCOUNTER — Other Ambulatory Visit: Payer: Self-pay

## 2021-04-28 DIAGNOSIS — M542 Cervicalgia: Secondary | ICD-10-CM

## 2021-04-28 DIAGNOSIS — R293 Abnormal posture: Secondary | ICD-10-CM | POA: Diagnosis not present

## 2021-04-28 NOTE — Therapy (Signed)
[place], [place], [address] Phone: [phone]   Fax:  [phone]  Physical Therapy Treatment  Patient Details  Name: KRYSTYNE TEWKSBURY MRN: [id number] Date of Birth: 15-Mar-1941 Referring Provider (PT): Carole Civil, MD   Encounter Date: 04/28/2021   PT End of Session - 04/28/21 0946     Visit Number 5    Number of Visits 8    Date for PT Re-Evaluation 05/11/21    Authorization Type [employer] Choice, auth sheet filled out on eval    PT Start Time 0945    PT Stop Time 1030    PT Time Calculation (min) 45 min    Activity Tolerance Patient tolerated treatment well    Behavior During Therapy [facility] for tasks assessed/performed             Past Medical History:  Diagnosis Date   Anemia    Arthritis    Coronary artery disease    moderate, by cath   Dyslipidemia    Fibroid    Glaucoma 09/25/2017   patient denies having this or being diagnosed with this ever   History of nuclear stress test 05/2010   bruce myoview; small fixed distal anteroapical, apical defect (breast attenuation or apical thinning), no reversible ischemia, post-stress EF 83%, abnormal but low risk study    Hyperlipidemia    Hypertension    Tremor    Vaginal dryness     Past Surgical History:  Procedure Laterality Date   ABDOMINAL HYSTERECTOMY  1995   BREAST SURGERY     Biopsy negative   CARDIAC CATHETERIZATION  05/31/2007   no signficant CAD, low normal EF (Dr. Gerrie Nordmann)    CATARACT EXTRACTION W/PHACO Right 03/02/2015   Procedure: CATARACT EXTRACTION PHACO AND INTRAOCULAR LENS PLACEMENT RIGHT EYE CDE=5.78;  Surgeon: [name], MD;  Location: [facility];  Service: Ophthalmology;  Laterality: Right;   CATARACT EXTRACTION W/PHACO Left 03/19/2015   Procedure: CATARACT EXTRACTION PHACO AND INTRAOCULAR LENS PLACEMENT LEFT EYE CDE=5.44;  Surgeon: [name], MD;  Location: [facility];  Service: Ophthalmology;  Laterality: Left;    TRANSTHORACIC ECHOCARDIOGRAM  2005   EF 60% with borderline conc LVH; mild TR, RVSP 60mmHg    TUBAL LIGATION  1977    There were no vitals filed for this visit.   Subjective Assessment - 04/28/21 0949     Subjective Some improvement in left shoulder since decreasing the UE resisted exercises    Pain Onset More than a month ago                [facility] PT Assessment - 04/28/21 0001       Assessment   Medical Diagnosis cervical spondylosis without myelopathy    Referring Provider (PT) Carole Civil, MD    Next MD Visit 05/27/21                           [facility] Adult PT Treatment/Exercise - 04/28/21 0001       Neck Exercises: Supine   Neck Retraction 10 reps      Neck Exercises: Stretches   Upper Trapezius Stretch Left;Right;2 reps;30 seconds    Corner Stretch 3 reps;30 seconds      Shoulder Exercises: Supine   Protraction AROM;Both;15 reps    Protraction Limitations fingers laced and performinng scapular protraction      Shoulder Exercises: Standing   Protraction Strengthening;Both;20 reps  Protraction Limitations forearms against wall      Manual Therapy   Manual Therapy Joint mobilization;Taping    Manual therapy comments manual therapy intervention completed seperately from all other interventions this date    Joint Mobilization left shoulder traction/distraction grade 2-4 oscillation for painrelief    Kinesiotex Facilitate Muscle;Create Space      Kinesiotix   Facilitate Muscle  left supraspinatus/impingement technique                       PT Short Term Goals - 04/13/21 1025       PT SHORT TERM GOAL #1   Title Patient will be independent with HEP in order to improve functional outcomes.    Time 2    Period Weeks    Status New    Target Date 04/27/21      PT SHORT TERM GOAL #2   Title Pt will rate neck/shoulder pain not exceeding 5/10 to enable improved quality of sleep    Baseline 8/10    Time 2    Period Weeks     Status New    Target Date 04/27/21      PT SHORT TERM GOAL #3   Title Improve left cervical rotation to 25% restricted to improve safety/comfort when driving    Baseline 59% limit    Time 2    Period Weeks    Status New    Target Date 04/27/21               PT Long Term Goals - 04/13/21 1026       PT LONG TERM GOAL #1   Title Patient will improve FOTO score by at least 5 points in order to indicate improved tolerance to activity.    Baseline 58% function    Time 4    Period Weeks    Status New    Target Date 05/11/21      PT LONG TERM GOAL #2   Title Pain-free left cervical rotation to improve safety/comfort when driving    Baseline 1/63 pain    Time 4    Period Weeks    Status New    Target Date 05/11/21                   Plan - 04/28/21 1035     Clinical Impression Statement decreased left shoulder pain with abduction following manual and reports improved symptoms/support with K--tape.  Continues to demo pain with resisted scaption/external rotation left shoulder. Some discomfort noted with left neck flexion with pain radiating into shoulder.  Seems to be nerve and shoulder tendon involvement. Continued with scapular stabilization and serratus anterior recruitment as pt tends to overcompensate with upper traps for elevation. Continued sessoins indictaed to decrease shoulder and neck pain    Personal Factors and Comorbidities Age;Time since onset of injury/illness/exacerbation    Examination-Activity Limitations Carry;Lift;Sleep    Examination-Participation Restrictions Cleaning;Yard Work;Driving    Stability/Clinical Decision Making Stable/Uncomplicated    Rehab Potential Good    PT Frequency 2x / week    PT Duration 4 weeks    PT Treatment/Interventions ADLs/Self Care Home Management;Electrical Stimulation;Ultrasound;Traction;Gait training;Functional mobility training;Therapeutic activities;Therapeutic exercise;Neuromuscular re-education;Dry  needling;Manual techniques;Passive range of motion;Taping;Spinal Manipulations;Joint Manipulations    PT Next Visit Plan postural re-education, STM, joint mobilizations?    Consulted and Agree with Plan of Care Patient             Patient will benefit from skilled therapeutic  intervention in order to improve the following deficits and impairments:  Decreased range of motion, Hypomobility, Increased muscle spasms, Pain, Postural dysfunction  Visit Diagnosis: Neck pain  Abnormal posture     Problem List Patient Active Problem List   Diagnosis Date Noted   Gastroesophageal reflux disease without esophagitis 09/15/2020   Seasonal allergic rhinitis 09/15/2020   Sore throat 09/15/2020   Bilateral carotid artery disease ([place]) 09/13/2018   Vaginitis and vulvovaginitis 09/07/2018   Arthritis 09/07/2018   Coronary artery calcification 08/29/2016   Dyspnea 08/29/2016   Mixed hyperlipidemia 08/29/2016   Coronary artery disease involving native coronary artery of native heart with angina pectoris ([place]) 08/29/2016   Obesity (BMI 30-39.9) 08/25/2015   Essential hypertension 07/08/2013   Dyslipidemia 07/08/2013   Tremor     Toniann Fail, PT 04/28/2021, 10:38 AM  [place] [address] [place], [place], [address] Phone: [phone]   Fax:  [phone]  Name: SHELENA CASTELLUCCIO MRN: [id number] Date of Birth: 06-30-1941

## 2021-04-30 ENCOUNTER — Encounter (HOSPITAL_COMMUNITY): Payer: Medicare PPO

## 2021-05-03 ENCOUNTER — Ambulatory Visit (HOSPITAL_COMMUNITY): Payer: Medicare PPO

## 2021-05-03 ENCOUNTER — Other Ambulatory Visit: Payer: Self-pay

## 2021-05-03 DIAGNOSIS — R293 Abnormal posture: Secondary | ICD-10-CM

## 2021-05-03 DIAGNOSIS — M542 Cervicalgia: Secondary | ICD-10-CM | POA: Diagnosis not present

## 2021-05-03 NOTE — Therapy (Signed)
[place], [place], [address] Phone: [phone]   Fax:  [phone]  Physical Therapy Treatment and D/C Summary  Patient Details  Name: Nicole Meadows MRN: [id number] Date of Birth: 1940-08-26 Referring Provider (PT): Carole Civil, MD  PHYSICAL THERAPY DISCHARGE SUMMARY  Visits from Start of Care: 6  Current functional level related to goals / functional outcomes: Able to improve cervical spine ROM and symptoms   Remaining deficits: Patient notes worse symptoms in her left shoulder and radiating pain into her left arm and increased pain with active use of LUE   Education / Equipment: HEP with emphasis on cervical spine activities and limiting activities for LUE   Patient agrees to discharge. Patient goals were partially met. Patient is being discharged due to did not respond to therapy.  Recommend further assessment of left shoulder  Encounter Date: 05/03/2021   PT End of Session - 05/03/21 1601     Visit Number 6    Number of Visits 8    Date for PT Re-Evaluation 05/11/21    Authorization Type [employer] Choice, auth sheet filled out on eval    PT Start Time 1600    PT Stop Time 1645    PT Time Calculation (min) 45 min    Activity Tolerance Patient tolerated treatment well    Behavior During Therapy WFL for tasks assessed/performed             Past Medical History:  Diagnosis Date   Anemia    Arthritis    Coronary artery disease    moderate, by cath   Dyslipidemia    Fibroid    Glaucoma 09/25/2017   patient denies having this or being diagnosed with this ever   History of nuclear stress test 05/2010   bruce myoview; small fixed distal anteroapical, apical defect (breast attenuation or apical thinning), no reversible ischemia, post-stress EF 83%, abnormal but low risk study    Hyperlipidemia    Hypertension    Tremor    Vaginal dryness     Past Surgical History:   Procedure Laterality Date   ABDOMINAL HYSTERECTOMY  1995   BREAST SURGERY     Biopsy negative   CARDIAC CATHETERIZATION  05/31/2007   no signficant CAD, low normal EF (Dr. Gerrie Nordmann)    CATARACT EXTRACTION W/PHACO Right 03/02/2015   Procedure: CATARACT EXTRACTION PHACO AND INTRAOCULAR LENS PLACEMENT RIGHT EYE CDE=5.78;  Surgeon: [name], MD;  Location: [facility];  Service: Ophthalmology;  Laterality: Right;   CATARACT EXTRACTION W/PHACO Left 03/19/2015   Procedure: CATARACT EXTRACTION PHACO AND INTRAOCULAR LENS PLACEMENT LEFT EYE CDE=5.44;  Surgeon: [name], MD;  Location: [facility];  Service: Ophthalmology;  Laterality: Left;   TRANSTHORACIC ECHOCARDIOGRAM  2005   EF 60% with borderline conc LVH; mild TR, RVSP 64mHg    TUBAL LIGATION  1977    There were no vitals filed for this visit.   Subjective Assessment - 05/03/21 1630     Subjective Patient notes worse symptoms with her LUE and shoulder and notes movements have become more painful and difficulty with simple tasks such as reaching up to close her window blinds.  Notes worse symptoms eminating from left shoulder and has been having increased difficulty in sleeping due to pain in her left shoulder and arm    Currently in Pain? Yes    Pain Score 8     Pain Location Shoulder    Pain Orientation Left  Pain Descriptors / Indicators Aching;Sharp;Sore    Pain Type Chronic pain    Pain Radiating Towards left shoulder, scapula    Pain Onset More than a month ago    Aggravating Factors  raising LUE, lifting objects with left arm                [facility] PT Assessment - 05/03/21 0001       Assessment   Medical Diagnosis cervical spondylosis without myelopathy    Referring Provider (PT) Carole Civil, MD    Next MD Visit 05/27/21      AROM   Left Shoulder Flexion 160 Degrees    Left Shoulder ABduction 160 Degrees    Left Shoulder External Rotation 75 Degrees    Cervical Extension WNL    Cervical - Right Side Bend WNL     Cervical - Left Side Bend 10% limited    Cervical - Right Rotation WNL    Cervical - Left Rotation 25% limited      Special Tests    Special Tests Rotator Cuff Impingement;Cervical    Cervical Tests Spurling's    Rotator Cuff Impingment tests Painful Arc of Motion;Neer impingement test;Full Can test;Belly Press;Lift- off test;Drop Arm test      Spurling's   Findings Negative    Comment some discomfort in left cervical column with compression, but no radiating pain experienced      Neer Impingement test    Findings Positive    Side Left      Lift-Off test   Findings Negative    Side Left      Full Can test   Findings Positive    Side Left      Drop Arm test   Findings Negative    Side Left      Painful Arc of Motion   Findings Positive    Side Left                           [facility] Adult PT Treatment/Exercise - 05/03/21 0001       Neck Exercises: Seated   Neck Retraction 10 reps    Lateral Flexion Both;10 reps    Shoulder Rolls Backwards;Forwards;10 reps      Neck Exercises: Supine   Neck Retraction 10 reps      Neck Exercises: Stretches   Upper Trapezius Stretch Left;Right;2 reps;30 seconds                     PT Education - 05/03/21 1633     Education Details discussion of assessment findings and rationale of her worsening symptoms and nature of musculoskeletal injuries    Person(s) Educated Patient    Methods Explanation    Comprehension Verbalized understanding              PT Short Term Goals - 05/03/21 1637       PT SHORT TERM GOAL #1   Title Patient will be independent with HEP in order to improve functional outcomes.    Time 2    Period Weeks    Status Achieved    Target Date 04/27/21      PT SHORT TERM GOAL #2   Title Pt will rate neck/shoulder pain not exceeding 5/10 to enable improved quality of sleep    Baseline 8/10    Time 2    Period Weeks    Status Not Met    Target Date 04/27/21  PT SHORT  TERM GOAL #3   Title Improve left cervical rotation to 25% restricted to improve safety/comfort when driving    Baseline 27% limited    Time 2    Period Weeks    Status Achieved    Target Date 04/27/21               PT Long Term Goals - 05/03/21 1638       PT LONG TERM GOAL #1   Title Patient will improve FOTO score by at least 5 points in order to indicate improved tolerance to activity.    Baseline 58% function    Time 4    Period Weeks    Status Not Met      PT LONG TERM GOAL #2   Title Pain-free left cervical rotation to improve safety/comfort when driving    Baseline "I really don't notice it"    Time 4    Period Weeks    Status Achieved                   Plan - 05/03/21 1633     Clinical Impression Statement LUE symptoms have been worsening despite activity modification and notes her left arm pain has been increasing with minor activities and has been having increased difficulty in sleeping due to pain/discomfort and reports the pain has been present during the day and with activity where it was previously tolerable with ADL and housekeeping. Demonstrates painful arc with active and passive movements of left shoulder. Resisted abduction, scaption, external rotation are most painful. Spurling's maneuver elicited some discomfort in left cervical column with compression, but no radiating pain reported. Recommend D/C from PT services at this time and refer back to ortho MD for further assessment due to progression of symptoms despite conservative care and activity modification    Personal Factors and Comorbidities Age;Time since onset of injury/illness/exacerbation    Examination-Activity Limitations Carry;Lift;Sleep    Examination-Participation Restrictions Cleaning;Yard Work;Driving    Stability/Clinical Decision Making Stable/Uncomplicated    Rehab Potential Good    PT Frequency 2x / week    PT Duration 4 weeks    PT Treatment/Interventions ADLs/Self Care  Home Management;Electrical Stimulation;Ultrasound;Traction;Gait training;Functional mobility training;Therapeutic activities;Therapeutic exercise;Neuromuscular re-education;Dry needling;Manual techniques;Passive range of motion;Taping;Spinal Manipulations;Joint Manipulations    PT Next Visit Plan postural re-education, STM, joint mobilizations?    Consulted and Agree with Plan of Care Patient             Patient will benefit from skilled therapeutic intervention in order to improve the following deficits and impairments:  Decreased range of motion, Hypomobility, Increased muscle spasms, Pain, Postural dysfunction  Visit Diagnosis: Neck pain  Abnormal posture     Problem List Patient Active Problem List   Diagnosis Date Noted   Gastroesophageal reflux disease without esophagitis 09/15/2020   Seasonal allergic rhinitis 09/15/2020   Sore throat 09/15/2020   Bilateral carotid artery disease ([place]) 09/13/2018   Vaginitis and vulvovaginitis 09/07/2018   Arthritis 09/07/2018   Coronary artery calcification 08/29/2016   Dyspnea 08/29/2016   Mixed hyperlipidemia 08/29/2016   Coronary artery disease involving native coronary artery of native heart with angina pectoris ([place]) 08/29/2016   Obesity (BMI 30-39.9) 08/25/2015   Essential hypertension 07/08/2013   Dyslipidemia 07/08/2013   Tremor     4:46 PM, 05/03/21 M. Sherlyn Lees, PT, DPT Physical Therapist- [place] Office Number: [phone]   [place] [address] [place], [place], [address] Phone: [phone]  Fax:  [phone]  Name: Nicole Meadows MRN: [id number] Date of Birth: [birth year]

## 2021-05-10 ENCOUNTER — Encounter (HOSPITAL_COMMUNITY): Payer: Medicare PPO

## 2021-05-12 ENCOUNTER — Encounter (HOSPITAL_COMMUNITY): Payer: Medicare PPO

## 2021-05-17 ENCOUNTER — Encounter (HOSPITAL_COMMUNITY): Payer: Medicare PPO

## 2021-05-19 ENCOUNTER — Encounter (HOSPITAL_COMMUNITY): Payer: Medicare PPO

## 2021-05-27 ENCOUNTER — Ambulatory Visit: Payer: Medicare PPO | Admitting: Orthopedic Surgery

## 2021-05-27 ENCOUNTER — Encounter: Payer: Self-pay | Admitting: Orthopedic Surgery

## 2021-05-27 ENCOUNTER — Other Ambulatory Visit: Payer: Self-pay

## 2021-05-27 VITALS — Ht 65.5 in | Wt 172.0 lb

## 2021-05-27 DIAGNOSIS — M792 Neuralgia and neuritis, unspecified: Secondary | ICD-10-CM

## 2021-05-27 DIAGNOSIS — M47812 Spondylosis without myelopathy or radiculopathy, cervical region: Secondary | ICD-10-CM | POA: Diagnosis not present

## 2021-05-27 DIAGNOSIS — M7542 Impingement syndrome of left shoulder: Secondary | ICD-10-CM | POA: Diagnosis not present

## 2021-05-27 NOTE — Progress Notes (Signed)
FOLLOW-UP OFFICE VISIT   Encounter Diagnoses  Name Primary?   Radicular pain of left upper extremity Yes   Cervical spondylosis without myelopathy     80 year old female previously seen in our office for shoulder pain diagnosed with tendinitis of the rotator cuff has a history of neck pain which was evaluated back in 2012 which was secondary to hitting her head that MRI showed some mild disc protrusions nothing serious  She comes in now with worsening neck and shoulder pain which is making it difficult for her to sleep.  The pain is in the cervical spine left side of the neck and radiates down the back of the left arm up to the elbow with occasional numbness in the left hand primarily long finger small and ring finger  She had therapy ibuprofen gabapentin and robaxin   (and prior treatment)  + EXAM FINDINGS: Abduction and flexion strength are normal but painful and she has impingement at 120 degrees  ASSESSMENT AND PLAN After further discussion we decided to go ahead and inject the subacromial space and then come back in 2 weeks and if were still not better we would proceed with MRI.  She did not tolerate the physical therapy as the exercises became painful and made the shoulder worse  Procedure note the subacromial injection shoulder left   Verbal consent was obtained to inject the  Left   Shoulder  Timeout was completed to confirm the injection site is a subacromial space of the  left  shoulder  Medication used Depo-Medrol 40 mg and lidocaine 1% 3 cc  Anesthesia was provided by ethyl chloride  The injection was performed in the left  posterior subacromial space. After pinning the skin with alcohol and anesthetized the skin with ethyl chloride the subacromial space was injected using a 20-gauge needle. There were no complications  Sterile dressing was applied.

## 2021-05-29 ENCOUNTER — Other Ambulatory Visit: Payer: Self-pay | Admitting: Orthopedic Surgery

## 2021-05-29 DIAGNOSIS — M47812 Spondylosis without myelopathy or radiculopathy, cervical region: Secondary | ICD-10-CM

## 2021-05-29 DIAGNOSIS — M792 Neuralgia and neuritis, unspecified: Secondary | ICD-10-CM

## 2021-06-10 ENCOUNTER — Other Ambulatory Visit: Payer: Self-pay

## 2021-06-10 ENCOUNTER — Ambulatory Visit: Payer: Medicare PPO | Admitting: Orthopedic Surgery

## 2021-06-10 DIAGNOSIS — M7542 Impingement syndrome of left shoulder: Secondary | ICD-10-CM | POA: Diagnosis not present

## 2021-06-10 DIAGNOSIS — M8580 Other specified disorders of bone density and structure, unspecified site: Secondary | ICD-10-CM | POA: Insufficient documentation

## 2021-06-10 NOTE — Progress Notes (Signed)
Chief Complaint  Patient presents with   Shoulder Pain    LT shoulder improving    Encounter Diagnosis  Name Primary?   Impingement syndrome of left shoulder Yes    80 year old female status post subacromial injection left shoulder with excellent results.  She can sleep at night now without pain she has improved range of motion  I reexamined her cuff and she has normal strength in abduction and flexion  She just has a little bit of discomfort in certain positions  I told her we could reinject in 6 weeks if necessary she does not appear to have a rotator cuff tear and I have released her until she gets more symptoms

## 2021-07-09 DIAGNOSIS — I10 Essential (primary) hypertension: Secondary | ICD-10-CM | POA: Diagnosis not present

## 2021-07-09 DIAGNOSIS — E785 Hyperlipidemia, unspecified: Secondary | ICD-10-CM | POA: Diagnosis not present

## 2021-07-09 DIAGNOSIS — Z1231 Encounter for screening mammogram for malignant neoplasm of breast: Secondary | ICD-10-CM | POA: Diagnosis not present

## 2021-08-03 ENCOUNTER — Other Ambulatory Visit: Payer: Self-pay | Admitting: Orthopedic Surgery

## 2021-08-03 DIAGNOSIS — M47812 Spondylosis without myelopathy or radiculopathy, cervical region: Secondary | ICD-10-CM

## 2021-08-03 DIAGNOSIS — M792 Neuralgia and neuritis, unspecified: Secondary | ICD-10-CM

## 2021-08-17 ENCOUNTER — Ambulatory Visit
Admission: EM | Admit: 2021-08-17 | Discharge: 2021-08-17 | Disposition: A | Discharge status: Home / Self Care | Payer: Medicare Other | Attending: Family Medicine | Admitting: Family Medicine

## 2021-08-17 ENCOUNTER — Other Ambulatory Visit: Payer: Self-pay

## 2021-08-17 ENCOUNTER — Encounter: Payer: Self-pay | Admitting: Emergency Medicine

## 2021-08-17 DIAGNOSIS — R197 Diarrhea, unspecified: Secondary | ICD-10-CM | POA: Diagnosis not present

## 2021-08-17 DIAGNOSIS — J069 Acute upper respiratory infection, unspecified: Secondary | ICD-10-CM

## 2021-08-17 MED ORDER — GUAIFENESIN ER 600 MG PO TB12: 600.0000 mg | ORAL_TABLET | Freq: Two times a day (BID) | ORAL | 0 refills | Status: DC | PRN

## 2021-08-17 MED ORDER — MOLNUPIRAVIR EUA 200MG CAPSULE: 4.0000 | ORAL_CAPSULE | Freq: Two times a day (BID) | ORAL | 0 refills | Status: AC

## 2021-08-17 NOTE — ED Provider Notes (Signed)
[facility]    CSN: [id number] Arrival date & time: 08/17/21  3614      History   Chief Complaint Chief Complaint  Patient presents with   Generalized Body Aches    HPI Nicole Meadows is a 81 y.o. female.   Presenting today with 2-day history of progressively worsening sore throat, nasal congestion, low-grade fever, headache, body aches, fatigue, productive cough, diarrhea.  Denies chest pain, shortness of breath, abdominal pain, nausea, vomiting.  So far trying some over-the-counter allergy medication, Coricidin with mild temporary relief.  Multiple sick contacts recently.  No known history of chronic pulmonary disease.   Past Medical History:  Diagnosis Date   Anemia    Arthritis    Coronary artery disease    moderate, by cath   Dyslipidemia    Fibroid    Glaucoma 09/25/2017   patient denies having this or being diagnosed with this ever   History of nuclear stress test 05/2010   bruce myoview; small fixed distal anteroapical, apical defect (breast attenuation or apical thinning), no reversible ischemia, post-stress EF 83%, abnormal but low risk study    Hyperlipidemia    Hypertension    Tremor    Vaginal dryness     Patient Active Problem List   Diagnosis Date Noted   Hypocalciuric hypercalcemia 06/10/2021   Osteopenia 06/10/2021   Gastroesophageal reflux disease without esophagitis 09/15/2020   Seasonal allergic rhinitis 09/15/2020   Sore throat 09/15/2020   Bilateral carotid artery disease ([place]) 09/13/2018   Vaginitis and vulvovaginitis 09/07/2018   Arthritis 09/07/2018   Coronary artery calcification 08/29/2016   Dyspnea 08/29/2016   Mixed hyperlipidemia 08/29/2016   Coronary artery disease involving native coronary artery of native heart with angina pectoris ([place]) 08/29/2016   Obesity (BMI 30-39.9) 08/25/2015   Essential hypertension 07/08/2013   Dyslipidemia 07/08/2013   Tremor     Past Surgical History:  Procedure Laterality Date    ABDOMINAL HYSTERECTOMY  1995   BREAST SURGERY     Biopsy negative   CARDIAC CATHETERIZATION  05/31/2007   no signficant CAD, low normal EF (Dr. Gerrie Nordmann)    CATARACT EXTRACTION W/PHACO Right 03/02/2015   Procedure: CATARACT EXTRACTION PHACO AND INTRAOCULAR LENS PLACEMENT RIGHT EYE CDE=5.78;  Surgeon: [name], MD;  Location: [facility];  Service: Ophthalmology;  Laterality: Right;   CATARACT EXTRACTION W/PHACO Left 03/19/2015   Procedure: CATARACT EXTRACTION PHACO AND INTRAOCULAR LENS PLACEMENT LEFT EYE CDE=5.44;  Surgeon: [name], MD;  Location: [facility];  Service: Ophthalmology;  Laterality: Left;   TRANSTHORACIC ECHOCARDIOGRAM  2005   EF 60% with borderline conc LVH; mild TR, RVSP 50mmHg    TUBAL LIGATION  1977    OB History     Gravida  2   Para  2   Term      Preterm      AB      Living         SAB      IAB      Ectopic      Multiple      Live Births               Home Medications    Prior to Admission medications   Medication Sig Start Date End Date Taking? Authorizing Provider  guaiFENesin (MUCINEX) 600 MG 12 hr tablet Take 1 tablet (600 mg total) by mouth 2 (two) times daily as needed. 08/17/21  Yes Volney American, PA-C  ibuprofen (ADVIL) 800 MG  tablet TAKE 1 TABLET BY MOUTH EVERY 8 HOURS AS NEEDED 05/31/21   Carole Civil, MD  molnupiravir EUA (LAGEVRIO) 200 mg CAPS capsule Take 4 capsules (800 mg total) by mouth 2 (two) times daily for 5 days. 08/17/21 08/22/21 Yes Volney American, PA-C  azelastine (ASTELIN) 137 MCG/SPRAY nasal spray Place 1 spray into the nose at bedtime. Use in each nostril as directed    [provider]  cholecalciferol (VITAMIN D) 1000 units tablet Take 2,000 Units by mouth daily.    [provider]  Cinnamon 500 MG capsule Take 500 mg by mouth daily.    [provider]  cycloSPORINE (RESTASIS) 0.05 % ophthalmic emulsion Place 1 drop into both eyes 2 (two) times daily.     [provider]  desonide (DESOWEN) 0.05 % cream Apply topically 2 (two) times daily.    [provider]  diclofenac Sodium (VOLTAREN) 1 % GEL Apply topically 4 (four) times daily.    [provider]  FLAX OIL-FISH OIL-BORAGE OIL PO Take 1 capsule by mouth daily.     [provider]  fluticasone (FLONASE) 50 MCG/ACT nasal spray Place 1 spray into both nostrils every morning.  Patient not taking: Reported on 08/17/2021 04/25/13   [provider]  gabapentin (NEURONTIN) 100 MG capsule Take 1 capsule (100 mg total) by mouth at bedtime. As needed for tingling 03/31/21   Carole Civil, MD  methocarbamol (ROBAXIN) 500 MG tablet TAKE 1 TABLET BY MOUTH EVERYDAY AT BEDTIME 08/03/21   Carole Civil, MD  nitroGLYCERIN (NITROLINGUAL) 0.4 MG/SPRAY spray Place 1 spray under the tongue every 5 (five) minutes x 3 doses as needed for chest pain. Patient not taking: Reported on 03/31/2021 08/25/15   Pixie Casino, MD  nystatin cream (MYCOSTATIN) Apply topically 2 (two) times daily.    [provider]  Nystatin POWD by Does not apply route.    [provider]  pantoprazole (PROTONIX) 20 MG tablet Take 1 tablet (20 mg total) by mouth daily. 01/28/18   Dorie Rank, MD  rosuvastatin (CRESTOR) 5 MG tablet Take 5 mg by mouth every evening.     [provider]  sucralfate (CARAFATE) 1 g tablet Take 1 tablet (1 g total) by mouth 4 (four) times daily -  with meals and at bedtime. Patient not taking: No sig reported 01/28/18   Dorie Rank, MD  telmisartan-hydrochlorothiazide (MICARDIS HCT) 80-12.5 MG per tablet Take 1 tablet by mouth daily. 07/08/13   Hilty, Nadean Corwin, MD    Family History Family History  Problem Relation Age of Onset   Hypertension Mother    Sudden death Mother    Heart disease Father    Kidney failure Father    Cancer Sister        uterine   Heart failure Brother        DM, HTN   Aneurysm Brother        abdominal; also HTN    Hypertension Brother        sudden death   Hypertension Brother    Hyperlipidemia Brother        also DM   Hypertension Brother    Other Brother        2 brain surgeries, bleeding    Social History Social History   Tobacco Use   Smoking status: Never   Smokeless tobacco: Never  Vaping Use   Vaping Use: Never used  Substance Use Topics   Alcohol use:  No   Drug use: No     Allergies   Hydrocodone, Molds & smuts, Zocor [simvastatin], Latex, and Penicillins   Review of Systems Review of Systems Per HPI  Physical Exam Triage Vital Signs ED Triage Vitals  Enc Vitals Group     BP 08/17/21 0948 120/72     Pulse Rate 08/17/21 0948 88     Resp 08/17/21 0948 18     Temp 08/17/21 0948 99.5 F (37.5 C)     Temp Source 08/17/21 0948 Oral     SpO2 08/17/21 0948 96 %     Weight --      Height --      Head Circumference --      Peak Flow --      Pain Score 08/17/21 0943 6     Pain Loc --      Pain Edu? --      Excl. in [place]? --    No data found.  Updated Vital Signs BP 120/72 (BP Location: Right Arm)    Pulse 88    Temp 99.5 F (37.5 C) (Oral)    Resp 18    SpO2 96%   Visual Acuity Right Eye Distance:   Left Eye Distance:   Bilateral Distance:    Right Eye Near:   Left Eye Near:    Bilateral Near:     Physical Exam Vitals and nursing note reviewed.  Constitutional:      Appearance: Normal appearance.  HENT:     Head: Atraumatic.     Right Ear: Tympanic membrane and external ear normal.     Left Ear: Tympanic membrane and external ear normal.     Nose: Rhinorrhea present.     Mouth/Throat:     Mouth: Mucous membranes are moist.     Pharynx: Posterior oropharyngeal erythema present.  Eyes:     Extraocular Movements: Extraocular movements intact.     Conjunctiva/sclera: Conjunctivae normal.  Cardiovascular:     Rate and Rhythm: Normal rate and regular rhythm.     Heart sounds: Normal heart sounds.  Pulmonary:     Effort: Pulmonary effort is normal.      Breath sounds: Normal breath sounds. No wheezing or rales.  Abdominal:     General: Bowel sounds are normal. There is no distension.     Palpations: Abdomen is soft.     Tenderness: There is no abdominal tenderness. There is no right CVA tenderness, left CVA tenderness or guarding.  Musculoskeletal:        General: Normal range of motion.     Cervical back: Normal range of motion and neck supple.  Skin:    General: Skin is warm and dry.  Neurological:     Mental Status: She is alert and oriented to person, place, and time.  Psychiatric:        Mood and Affect: Mood normal.        Thought Content: Thought content normal.   UC Treatments / Results  Labs (all labs ordered are listed, but only abnormal results are displayed) Labs Reviewed  COVID-19, FLU A+B NAA    EKG  Radiology No results found.  Procedures Procedures (including critical care time)  Medications Ordered in UC Medications - No data to display  Initial Impression / Assessment and Plan / UC Course  I have reviewed the triage vital signs and the nursing notes.  Pertinent labs & imaging results that were available during my care of the patient were  reviewed by me and considered in my medical decision making (see chart for details).     Vital signs benign and reassuring today, exam indicative of a viral upper respiratory infection.  Given symptoms and community exposures, suspect COVID-19.  COVID and flu testing pending, will start antiviral for COVID while awaiting results.  Mucinex, Coricidin, supportive home care reviewed additionally.  Return for acutely worsening symptoms.  Final Clinical Impressions(s) / UC Diagnoses   Final diagnoses:  Viral URI with cough  Diarrhea, unspecified type   Discharge Instructions   None    ED Prescriptions     Medication Sig Dispense Auth. Provider   molnupiravir EUA (LAGEVRIO) 200 mg CAPS capsule Take 4 capsules (800 mg total) by mouth 2 (two) times daily for 5 days.  40 capsule Volney American, [place]   guaiFENesin (MUCINEX) 600 MG 12 hr tablet Take 1 tablet (600 mg total) by mouth 2 (two) times daily as needed. 30 tablet Volney American, [place]      PDMP not reviewed this encounter.   Volney American, [place] 08/17/21 1010

## 2021-08-17 NOTE — ED Triage Notes (Signed)
General body aches on Sunday.  Yesterday was still sore and started having frequent stool, low grade temperature, headache and productive cough with yellow phlegm and blowing yellow from nose.    Patient returned Sunday from a funeral out of town.

## 2021-08-18 LAB — COVID-19, FLU A+B NAA
Influenza A, NAA: NOT DETECTED
Influenza B, NAA: NOT DETECTED
SARS-CoV-2, NAA: DETECTED — AB

## 2021-09-21 ENCOUNTER — Ambulatory Visit (HOSPITAL_BASED_OUTPATIENT_CLINIC_OR_DEPARTMENT_OTHER): Payer: Medicare PPO | Admitting: Internal Medicine

## 2021-09-30 ENCOUNTER — Ambulatory Visit: Payer: Medicare PPO | Admitting: Neurology

## 2021-09-30 ENCOUNTER — Other Ambulatory Visit: Payer: Self-pay

## 2021-09-30 VITALS — BP 119/63 | HR 58 | Ht 65.5 in | Wt 178.5 lb

## 2021-09-30 DIAGNOSIS — G250 Essential tremor: Secondary | ICD-10-CM | POA: Diagnosis not present

## 2021-09-30 NOTE — Progress Notes (Signed)
Subjective:    Patient ID: Nicole Meadows is a 81 y.o. female.  HPI    Interim history:   Nicole Meadows is a 81 year old right-handed woman with an underlying medical history of allergies, hypertension, reflux disease and obesity, who presents for follow-up consultation of her head tremor. The patient is unaccompanied today and presents for her yearly checkup. I last saw her on 09/24/2020, at which time she was stable with her tremor. She reported left-sided neck pain.    Today, 09/30/21: She reports doing well, tremor does not bother her, flares up when she is nervous, has to speak in front of a crowd, or has not slept well.  She has a prescription for gabapentin from her orthopedic doctor but has taken it only 5 or 6 times.  She has a prescription for a muscle relaxer for her shoulder pain as well but is very cautious with it.  She is not able to sleep on her left side which is her preferred side because of her left shoulder pain. She has seen orthopedics for left shoulder pain.  She recently received a cortisone injection.  She has been in PT for neck pain but feels that the physical therapy actually exacerbated her shoulder pain.  The patient's allergies, current medications, family history, past medical history, past social history, past surgical history and problem list were reviewed and updated as appropriate.    Previously:  I saw her on 09/25/19, at which time she was stable with her tremor.   I saw her on 09/20/2018, at which time she reported doing well from the tremor standpoint.  She did have a fall in December 2019, and hurt her right shoulder.  She had cardiac work-up because of chest pain which was negative.  She was felt to have reflux related symptoms.    I saw her on 09/18/2017, at which time she felt her tremor was stable. Her tremor was mostly noticeable when she was fatigued. She did have some in from issues with low back pain and joint pain. We mutually agreed to follow her  clinically.     I first met her on 09/14/2016 at the request of her primary care physician, at which time she reported a long-standing history of tremors. I suggested observation of her overall mild head tremor, and recheck in one year routinely.   09/14/2016: (She) reports a long-standing history of tremors. Some 6 weeks ago she had an exacerbation of her tremor and it affected her whole body at the time. She did have joint pain at the time and also flareup in her headaches. She has a remote history of migraine headaches but they improved significantly after she had her hysterectomy in 1995. She has occasional snoring, no morning headaches.  She has previously been seen by Dr. Erling Cruz in our office for many years. I reviewed an office note by Dr. Erling Cruz from 11/30/2010, at which time he talk to her about her head tremor and headaches. He had seen her many years ago in 2005 for recurrent headaches. She reported in 2012 that her head tremor was getting worse. She declined symptomatic medication at the time. She also had symptomatic dizziness at the time. I also reviewed an office note by Dr. love from 04/11/2008, at which time he reported that she had a long-standing history of essential tremor for which he saw her in 2005. He also saw her for headaches. He also mentions a prior visit from November 1998 for vertigo and essential tremor.  He reported in 2009 that she had never been on Topamax her beta blocker. He suggested trial of Inderal versus Topamax but was hesitant to suggest Inderal because of prior history of pulmonary problems. They mutually agreed to forego treatment at the time. I reviewed blood test results from your office from 08/19/2016. CMP was unremarkable with the exception of calcium borderline at 11. CBC with differential was unremarkable. ESR was normal. Total cholesterol was 139, triglycerides 129, LDL 54. TSH normal. Hemoglobin A1c was 5.7, free T4 and free T3 were normal. Vitamin D was  borderline at 29. She had a brain MRI with and without contrast on 08/22/2003 as well as a brain MRA without contrast on 08/22/2003 which I reviewed: Brain MRA was normal, brain MRI was normal. She had a CTH wo contrast on 10/30/13: normal.  She had a recent nuclear stress test under Dr. Debara Pickett on 08/30/16, and I reviewed the results: Normal EF and no ischemia.  [facility], even lost weight, but d/t to her husband's health issues did not go as regularly as she did not want to leave him alone at home.  She reports a recent incident of whole body trembling and had a few other episodes since then. She has a history of arthritis and had joint pains in both ankles and both knees and was given a tapering course of prednisone for 2 weeks, which started a week after the increase in tremor.  She has since then seen a rheumatologist last week, had a number of X rays, was told she has OA. She may have had a viral illness, her rheumatologist suspected d/t to flare up of joint pain and headaches last month.  Overall, she feels that since the flareup of her tremor she has reverted back to her baseline. She is not particularly bothered by her head tremor and does not report any hand tremor. She has a strong family history of tremors. Her father had hand tremors, she had a total of 9 siblings out of which 6 had some form of tremor including head, voice or hand tremors. She is very active, she is still able to do everything around the house, she drives without problems. She does not drink alcohol regularly, only on special occasions, she does not drink caffeine daily, coffee maybe 3 cups per week. She has 2 grown children. She lives with her husband, son and daughter-in-law. She is retired. She has occasional forgetfulness and mostly difficulty with names but nothing sinister.  Her Past Medical History Is Significant For: Past Medical History:  Diagnosis Date   Anemia    Arthritis    Coronary  artery disease    moderate, by cath   Dyslipidemia    Fibroid    Glaucoma 09/25/2017   patient denies having this or being diagnosed with this ever   History of nuclear stress test 05/2010   bruce myoview; small fixed distal anteroapical, apical defect (breast attenuation or apical thinning), no reversible ischemia, post-stress EF 83%, abnormal but low risk study    Hyperlipidemia    Hypertension    Tremor    Vaginal dryness     Her Past Surgical History Is Significant For: Past Surgical History:  Procedure Laterality Date   ABDOMINAL HYSTERECTOMY  1995   BREAST SURGERY     Biopsy negative   CARDIAC CATHETERIZATION  05/31/2007   no signficant CAD, low normal EF (Dr. Gerrie Nordmann)    CATARACT EXTRACTION W/PHACO  Right 03/02/2015   Procedure: CATARACT EXTRACTION PHACO AND INTRAOCULAR LENS PLACEMENT RIGHT EYE CDE=5.78;  Surgeon: [name], MD;  Location: [facility];  Service: Ophthalmology;  Laterality: Right;   CATARACT EXTRACTION W/PHACO Left 03/19/2015   Procedure: CATARACT EXTRACTION PHACO AND INTRAOCULAR LENS PLACEMENT LEFT EYE CDE=5.44;  Surgeon: [name], MD;  Location: [facility];  Service: Ophthalmology;  Laterality: Left;   TRANSTHORACIC ECHOCARDIOGRAM  2005   EF 60% with borderline conc LVH; mild TR, RVSP 74mHg    TUBAL LIGATION  1977    Her Family History Is Significant For: Family History  Problem Relation Age of Onset   Hypertension Mother    Sudden death Mother    Heart disease Father    Kidney failure Father    Cancer Sister        uterine   Heart failure Brother        DM, HTN   Aneurysm Brother        abdominal; also HTN   Hypertension Brother        sudden death   Hypertension Brother    Hyperlipidemia Brother        also DM   Hypertension Brother    Other Brother        2 brain surgeries, bleeding    Her Social History Is Significant For: Social History   Socioeconomic History   Marital status: Married    Spouse name: Not on file   Number of children:  Not on file   Years of education: Not on file   Highest education level: Not on file  Occupational History   Not on file  Tobacco Use   Smoking status: Never   Smokeless tobacco: Never  Vaping Use   Vaping Use: Never used  Substance and Sexual Activity   Alcohol use: No   Drug use: No   Sexual activity: Not on file    Comment: Hysterectomy  Other Topics Concern   Not on file  Social History Narrative   Not on file   Social Determinants of Health   Financial Resource Strain: Not on file  Food Insecurity: Not on file  Transportation Needs: Not on file  Physical Activity: Not on file  Stress: Not on file  Social Connections: Not on file    Her Allergies Are:  Allergies  Allergen Reactions   Hydrocodone Nausea And Vomiting   Molds & Smuts     Other reaction(s): Other (See Comments)   Zocor [Simvastatin]     myalgias   Latex Rash   Penicillins Rash    Has patient had a PCN reaction causing immediate rash, facial/tongue/throat swelling, SOB or lightheadedness with hypotension: Unknown Has patient had a PCN reaction causing severe rash involving mucus membranes or skin necrosis: Yes Has patient had a PCN reaction that required hospitalization: No Has patient had a PCN reaction occurring within the last 10 years: No If all of the above answers are "NO", then may proceed with Cephalosporin use.  :   Her Current Medications Are:  Outpatient Encounter Medications as of 09/30/2021  Medication Sig   azelastine (ASTELIN) 137 MCG/SPRAY nasal spray Place 1 spray into the nose at bedtime. Use in each nostril as directed   cholecalciferol (VITAMIN D) 1000 units tablet Take 2,000 Units by mouth daily.   Cinnamon 500 MG capsule Take 500 mg by mouth daily.   cycloSPORINE (RESTASIS) 0.05 % ophthalmic emulsion Place 1 drop into both eyes 2 (two) times daily.  desonide (DESOWEN) 0.05 % cream Apply topically 2 (two) times daily.   diclofenac Sodium (VOLTAREN) 1 % GEL Apply topically 4  (four) times daily.   FLAX OIL-FISH OIL-BORAGE OIL PO Take 1 capsule by mouth daily.    fluticasone (FLONASE) 50 MCG/ACT nasal spray Place 1 spray into both nostrils every morning.   gabapentin (NEURONTIN) 100 MG capsule Take 1 capsule (100 mg total) by mouth at bedtime. As needed for tingling   guaiFENesin (MUCINEX) 600 MG 12 hr tablet Take 1 tablet (600 mg total) by mouth 2 (two) times daily as needed.   ibuprofen (ADVIL) 800 MG tablet TAKE 1 TABLET BY MOUTH EVERY 8 HOURS AS NEEDED   methocarbamol (ROBAXIN) 500 MG tablet TAKE 1 TABLET BY MOUTH EVERYDAY AT BEDTIME   nitroGLYCERIN (NITROLINGUAL) 0.4 MG/SPRAY spray Place 1 spray under the tongue every 5 (five) minutes x 3 doses as needed for chest pain.   nystatin cream (MYCOSTATIN) Apply topically 2 (two) times daily.   Nystatin POWD by Does not apply route.   pantoprazole (PROTONIX) 20 MG tablet Take 1 tablet (20 mg total) by mouth daily.   rosuvastatin (CRESTOR) 5 MG tablet Take 5 mg by mouth every evening.    sucralfate (CARAFATE) 1 g tablet Take 1 tablet (1 g total) by mouth 4 (four) times daily -  with meals and at bedtime.   telmisartan-hydrochlorothiazide (MICARDIS HCT) 80-12.5 MG per tablet Take 1 tablet by mouth daily.   No facility-administered encounter medications on file as of 09/30/2021.  :  Review of Systems:  Out of a complete 14 point review of systems, all are reviewed and negative with the exception of these symptoms as listed below:  Review of Systems  Neurological:        Here for yearly f/u on tremors- sx are the same. Pt reports sx are most pronounced when she is tired or stressed.    Objective:  Neurological Exam  Physical Exam Physical Examination:   Vitals:   09/30/21 1052  BP: 119/63  Pulse: (!) 58    General Examination: The patient is a very pleasant 81 y.o. female in no acute distress. She appears well-developed and well-nourished and well groomed.   HEENT: Normocephalic, atraumatic, pupils are  equal, round and reactive to light, extraocular tracking is well-preserved, corrective eyeglasses in place.  Hearing grossly intact, face is symmetric with normal facial animation, no dysarthria, intermittent voice tremor noted.  She has a stable head tremor, neck is supple otherwise, no carotid bruits.  Oropharynx exam reveals: mild mouth dryness, adequate dental hygiene. Tongue protrudes centrally and palate elevates symmetrically.    Chest: Clear to auscultation without wheezing, rhonchi or crackles noted.   Heart: S1+S2+0, regular and normal without murmurs, rubs or gallops noted.    Abdomen: Soft, non-tender and non-distended.   Extremities: There is no pitting edema in the distal lower extremities bilaterally.   Skin: Warm and dry without trophic changes noted.   Musculoskeletal: exam reveals no obvious joint deformities, decreased range of motion in L shoulder.     Neurologically:  Mental status: The patient is awake, alert and oriented in all 4 spheres. Her immediate and remote memory, attention, language skills and fund of knowledge are appropriate. There is no evidence of aphasia, agnosia, apraxia or anomia. Speech is clear with normal prosody and enunciation. Thought process is linear. Mood is normal and affect is normal.  Cranial nerves II - XII are as described above under HEENT exam. Motor exam: Normal bulk,  strength and tone is noted. There is no drift, or resting tremor.    (On 09/14/2016: Handwriting is not micrographic, very legible. On Archimedes spiral drawing she has no difficulty with her dominant hand which is the right and minimal trembling noted on the left).    She has no significant postural or action tremor in the UEs, no lower extremity tremor. Fine motor skills and coordination: grossly intact for age.   Cerebellar testing: No dysmetria or intention tremor. There is no truncal or gait ataxia.  Sensory exam: intact to light touch.  Gait, station and balance: She  stands easily. No veering to one side is noted. No leaning to one side is noted. Posture is age-appropriate and stance is narrow based. Gait shows normal stride length and normal pace. No problems turning are noted.  No walking aid.        Assessment and Plan:  In summary, SYNIA DOUGLASS is a very pleasant 81 year old female with an underlying medical history of allergies, hypertension, reflux disease and overweight state, who presents for follow-up consultation of her essential tremor affecting her head and voice with mild progression over the years.  She has a strong family history of essential tremor.  Tremor does not bother her and she does not have much in the way of hand tremors.  Prior testing included head CT and she also has had a brain MRI and MR angiogram in the past. She has experienced neck pain in the past year, she also had left shoulder pain.  She has seen orthopedics and has had physical therapy.  She received a steroid shot recently.  From the tremor standpoint, we mutually agreed to continue to monitor on a yearly basis.  She is advised to follow-up to see one of our nurse practitioners next time. I answered all her questions today and she was in agreement.   I spent 25 minutes in total face-to-face time and in reviewing records during pre-charting, more than 50% of which was spent in counseling and coordination of care, reviewing test results, reviewing medications and treatment regimen and/or in discussing or reviewing the diagnosis of ET, the prognosis and treatment options. Pertinent laboratory and imaging test results that were available during this visit with the patient were reviewed by me and considered in my medical decision making (see chart for details).

## 2021-09-30 NOTE — Patient Instructions (Signed)
It was nice to see you again today.  Your tremor is indeed stable.  Please continue to hydrate well.  I am glad to hear that you are doing well at this time.  Please follow-up routinely to see 1 of our nurse practitioners next year. ?

## 2021-12-10 DIAGNOSIS — I10 Essential (primary) hypertension: Secondary | ICD-10-CM | POA: Diagnosis not present

## 2021-12-10 DIAGNOSIS — E785 Hyperlipidemia, unspecified: Secondary | ICD-10-CM | POA: Diagnosis not present

## 2021-12-10 DIAGNOSIS — Z0000 Encounter for general adult medical examination without abnormal findings: Secondary | ICD-10-CM | POA: Diagnosis not present

## 2021-12-10 DIAGNOSIS — Z6829 Body mass index (BMI) 29.0-29.9, adult: Secondary | ICD-10-CM | POA: Diagnosis not present

## 2021-12-10 DIAGNOSIS — E1169 Type 2 diabetes mellitus with other specified complication: Secondary | ICD-10-CM | POA: Diagnosis not present

## 2021-12-10 DIAGNOSIS — G259 Extrapyramidal and movement disorder, unspecified: Secondary | ICD-10-CM | POA: Diagnosis not present

## 2021-12-15 ENCOUNTER — Encounter: Payer: Self-pay | Admitting: Podiatry

## 2021-12-15 ENCOUNTER — Ambulatory Visit: Payer: Medicare PPO | Admitting: Podiatry

## 2021-12-15 DIAGNOSIS — L600 Ingrowing nail: Secondary | ICD-10-CM | POA: Diagnosis not present

## 2021-12-15 DIAGNOSIS — M21619 Bunion of unspecified foot: Secondary | ICD-10-CM | POA: Diagnosis not present

## 2021-12-15 NOTE — Patient Instructions (Signed)

## 2021-12-16 NOTE — Progress Notes (Signed)
Subjective:   Patient ID: Nicole Meadows, female   DOB: 81 y.o.   MRN: [id number]   HPI Patient presents stating she has an ingrown toenail of her left big toe that is very sore and she has tried to trim it soak it without relief of symptoms and also does have structural bunion deformity that she tries to accommodate.  Patient does not smoke likes to be active   Review of Systems  All other systems reviewed and are negative.      Objective:  Physical Exam Vitals and nursing note reviewed.  Constitutional:      Appearance: She is well-developed.  Pulmonary:     Effort: Pulmonary effort is normal.  Musculoskeletal:        General: Normal range of motion.  Skin:    General: Skin is warm.  Neurological:     Mental Status: She is alert.    Neurovascular status found to be intact muscle strength found to be adequate range of motion within normal limits.  Patient is noted to have incurvated medial border left hallux painful when pressed with slight redness no active drainage noted and has moderate prominence of the first metatarsal head of both feet with redness around the joint surface and pain.  Patient has good digital perfusion well oriented x3     Assessment:  Ingrown toenail deformity left hallux medial border with pain along with bunion deformity bilateral that is moderately symptomatic     Plan:  H&P done with patient and went ahead today discussed conditions do not recommend current treatment for bunions but I did educate her on them and the type of shoe gear that would be effective.  Recommended correction of ingrown toenail she wants it fixed and I allowed her to read it and then signed consent form going over all possible complications associated with surgery.  Patient wants to have this done and is willing to accept risk and today and signed consent form and I infiltrated the left hallux 60 mg like Marcaine mixture sterile prep done and using sterile instrumentation I remove  the medial border exposed matrix applied phenol 3 applications 30 seconds followed by alcohol lavage sterile dressing and instructed on soaks leave dressing on 24 hours but take it off earlier if throbbing were to occur and encouraged her to call with questions concerns which may arise during the recovery

## 2021-12-22 DIAGNOSIS — Z23 Encounter for immunization: Secondary | ICD-10-CM | POA: Diagnosis not present

## 2021-12-22 DIAGNOSIS — S61202A Unspecified open wound of right middle finger without damage to nail, initial encounter: Secondary | ICD-10-CM | POA: Diagnosis not present

## 2021-12-27 ENCOUNTER — Other Ambulatory Visit: Payer: Self-pay | Admitting: Family Medicine

## 2021-12-27 ENCOUNTER — Other Ambulatory Visit (HOSPITAL_COMMUNITY): Payer: Self-pay | Admitting: Family Medicine

## 2021-12-27 ENCOUNTER — Telehealth: Payer: Self-pay | Admitting: Podiatry

## 2021-12-27 DIAGNOSIS — I82409 Acute embolism and thrombosis of unspecified deep veins of unspecified lower extremity: Secondary | ICD-10-CM | POA: Diagnosis not present

## 2021-12-27 DIAGNOSIS — M7989 Other specified soft tissue disorders: Secondary | ICD-10-CM

## 2021-12-27 DIAGNOSIS — R197 Diarrhea, unspecified: Secondary | ICD-10-CM | POA: Diagnosis not present

## 2021-12-27 NOTE — Telephone Encounter (Signed)
Patient got right great toe ingrown toenail done. She stated she was not instructed on how long to soak the toe. She has been soaking the toe for 11 days now. There is not pus, just a little sore and some numbness under the toe.  Please advise   Cell: [phone]

## 2021-12-27 NOTE — Telephone Encounter (Signed)
Should be able to stop this week

## 2021-12-29 ENCOUNTER — Ambulatory Visit (HOSPITAL_COMMUNITY)
Admission: RE | Admit: 2021-12-29 | Discharge: 2021-12-29 | Disposition: A | Discharge status: Home / Self Care | Payer: Medicare PPO | Source: Ambulatory Visit | Attending: Family Medicine | Admitting: Family Medicine

## 2021-12-29 DIAGNOSIS — R600 Localized edema: Secondary | ICD-10-CM | POA: Diagnosis not present

## 2021-12-29 DIAGNOSIS — M79662 Pain in left lower leg: Secondary | ICD-10-CM | POA: Insufficient documentation

## 2021-12-29 DIAGNOSIS — M79605 Pain in left leg: Secondary | ICD-10-CM | POA: Diagnosis not present

## 2021-12-29 DIAGNOSIS — M7989 Other specified soft tissue disorders: Secondary | ICD-10-CM | POA: Diagnosis not present

## 2021-12-30 ENCOUNTER — Other Ambulatory Visit: Payer: Self-pay

## 2021-12-30 ENCOUNTER — Encounter (HOSPITAL_COMMUNITY): Payer: Self-pay | Admitting: Emergency Medicine

## 2021-12-30 ENCOUNTER — Emergency Department (HOSPITAL_COMMUNITY): Payer: Medicare PPO

## 2021-12-30 ENCOUNTER — Emergency Department (HOSPITAL_COMMUNITY)
Admission: EM | Admit: 2021-12-30 | Discharge: 2021-12-30 | Disposition: A | Discharge status: Home / Self Care | Payer: Medicare PPO | Attending: Emergency Medicine | Admitting: Emergency Medicine

## 2021-12-30 DIAGNOSIS — R079 Chest pain, unspecified: Secondary | ICD-10-CM

## 2021-12-30 DIAGNOSIS — J9811 Atelectasis: Secondary | ICD-10-CM | POA: Diagnosis not present

## 2021-12-30 DIAGNOSIS — R0789 Other chest pain: Secondary | ICD-10-CM | POA: Diagnosis not present

## 2021-12-30 DIAGNOSIS — Z91040 Latex allergy status: Secondary | ICD-10-CM | POA: Diagnosis not present

## 2021-12-30 LAB — CBC WITH DIFFERENTIAL/PLATELET
Abs Immature Granulocytes: 0.01 10*3/uL (ref 0.00–0.07)
Basophils Absolute: 0 10*3/uL (ref 0.0–0.1)
Basophils Relative: 0 %
Eosinophils Absolute: 0 10*3/uL (ref 0.0–0.5)
Eosinophils Relative: 1 %
HCT: 41.9 % (ref 36.0–46.0)
Hemoglobin: 13.6 g/dL (ref 12.0–15.0)
Immature Granulocytes: 0 %
Lymphocytes Relative: 27 %
Lymphs Abs: 1.3 10*3/uL (ref 0.7–4.0)
MCH: 30.6 pg (ref 26.0–34.0)
MCHC: 32.5 g/dL (ref 30.0–36.0)
MCV: 94.2 fL (ref 80.0–100.0)
Monocytes Absolute: 0.4 10*3/uL (ref 0.1–1.0)
Monocytes Relative: 8 %
Neutro Abs: 3.1 10*3/uL (ref 1.7–7.7)
Neutrophils Relative %: 64 %
Platelets: 190 10*3/uL (ref 150–400)
RBC: 4.45 MIL/uL (ref 3.87–5.11)
RDW: 13.2 % (ref 11.5–15.5)
WBC: 4.8 10*3/uL (ref 4.0–10.5)
nRBC: 0 % (ref 0.0–0.2)

## 2021-12-30 LAB — TROPONIN I (HIGH SENSITIVITY)
Troponin I (High Sensitivity): 2 ng/L (ref <18)
Troponin I (High Sensitivity): 2 ng/L (ref <18)

## 2021-12-30 LAB — BASIC METABOLIC PANEL
Anion gap: 9 (ref 5–15)
BUN: 20 mg/dL (ref 8–23)
CO2: 25 mmol/L (ref 22–32)
Calcium: 10.4 mg/dL — ABNORMAL HIGH (ref 8.9–10.3)
Chloride: 106 mmol/L (ref 98–111)
Creatinine, Ser: 0.81 mg/dL (ref 0.44–1.00)
GFR, Estimated: >60 mL/min (ref >60)
Glucose, Bld: 102 mg/dL — ABNORMAL HIGH (ref 70–99)
Potassium: 4.1 mmol/L (ref 3.5–5.1)
Sodium: 140 mmol/L (ref 135–145)

## 2021-12-30 NOTE — ED Triage Notes (Signed)
Pt to the ED with left side chest pain under her left breast that she describes as nagging pain.  Pt also states she has numbness and tingling that radiates up her neck and on her left face. Patient states the symptoms started a week ago and her husband made her come to the ED.  Pt states she has mild-moderate carotid artery blockage.  Pt is negative for weakness in all limbs, face is symmetrical, and denies vision changes. Stroke screen negative.

## 2021-12-30 NOTE — ED Provider Notes (Signed)
[facility] EMERGENCY DEPARTMENT Provider Note   CSN: [id number] Arrival date & time: 12/30/21  9211     History  Chief Complaint  Patient presents with   Chest Pain    Nicole Meadows is a 81 y.o. female.   Chest Pain Patient presents with chest pain.  Anterior chest.  Goes to the left neck at times.  States she has shoulder problems on that time.  States pain is not exertional.  States she was able to walk without difficulty.  No numbness or weakness.  No confusion.  No cough.  Has had pains for about a week now.  Did have a negative stress test although it was about 5 years ago now.     Home Medications Prior to Admission medications   Medication Sig Start Date End Date Taking? Authorizing Provider  azelastine (ASTELIN) 137 MCG/SPRAY nasal spray Place 1 spray into the nose at bedtime. Use in each nostril as directed   Yes [provider]  cholecalciferol (VITAMIN D) 1000 units tablet Take 2,000 Units by mouth daily.   Yes [provider]  Cinnamon 500 MG capsule Take 500 mg by mouth daily.   Yes [provider]  cycloSPORINE (RESTASIS) 0.05 % ophthalmic emulsion Place 1 drop into both eyes 2 (two) times daily.    Yes [provider]  desonide (DESOWEN) 0.05 % cream Apply topically 2 (two) times daily.   Yes [provider]  diclofenac Sodium (VOLTAREN) 1 % GEL Apply topically 4 (four) times daily.   Yes [provider]  doxycycline (VIBRAMYCIN) 100 MG capsule Take 100 mg by mouth 2 (two) times daily. 12/22/21  Yes [provider]  FLAX OIL-FISH OIL-BORAGE OIL PO Take 1 capsule by mouth daily.    Yes [provider]  nitroGLYCERIN (NITROLINGUAL) 0.4 MG/SPRAY spray Place 1 spray under the tongue every 5 (five) minutes x 3 doses as needed for chest pain. 08/25/15  Yes Hilty, Nadean Corwin, MD  nystatin cream (MYCOSTATIN) Apply topically 2 (two) times daily.   Yes [provider]  pantoprazole (PROTONIX) 40  MG tablet Take 40 mg by mouth daily. 11/28/21  Yes [provider]  rosuvastatin (CRESTOR) 5 MG tablet Take 5 mg by mouth every evening.    Yes [provider]  telmisartan-hydrochlorothiazide (MICARDIS HCT) 80-12.5 MG per tablet Take 1 tablet by mouth daily. 07/08/13  Yes Hilty, Nadean Corwin, MD  gabapentin (NEURONTIN) 100 MG capsule Take 1 capsule (100 mg total) by mouth at bedtime. As needed for tingling Patient not taking: Reported on 12/30/2021 03/31/21   Carole Civil, MD  guaiFENesin (MUCINEX) 600 MG 12 hr tablet Take 1 tablet (600 mg total) by mouth 2 (two) times daily as needed. Patient not taking: Reported on 12/30/2021 08/17/21   Volney American, PA-C  ibuprofen (ADVIL) 800 MG tablet TAKE 1 TABLET BY MOUTH EVERY 8 HOURS AS NEEDED Patient not taking: Reported on 12/30/2021 05/31/21   Carole Civil, MD  methocarbamol (ROBAXIN) 500 MG tablet TAKE 1 TABLET BY MOUTH EVERYDAY AT BEDTIME Patient not taking: Reported on 12/30/2021 08/03/21   Carole Civil, MD  sucralfate (CARAFATE) 1 g tablet Take 1 tablet (1 g total) by mouth 4 (four) times daily -  with meals and at bedtime. Patient not taking: Reported on 12/30/2021 01/28/18   Dorie Rank, MD      Allergies    Hydrocodone, Molds & smuts, Zocor [simvastatin], Latex, and Penicillins    Review of Systems  Review of Systems  Cardiovascular:  Positive for chest pain.    Physical Exam Updated Vital Signs BP 123/82   Pulse 60   Temp 98 F (36.7 C) (Oral)   Resp 19   Ht 5' 5.5" (1.664 m)   Wt 81 kg   SpO2 96%   BMI 29.26 kg/m  Physical Exam Vitals reviewed.  HENT:     Head: Atraumatic.  Cardiovascular:     Rate and Rhythm: Regular rhythm.  Pulmonary:     Breath sounds: No decreased breath sounds or wheezing.  Chest:     Chest wall: Tenderness present.     Comments: Tenderness to right anterior chest wall. Abdominal:     Tenderness: There is no abdominal tenderness.  Musculoskeletal:     Right lower  leg: No edema.     Left lower leg: No edema.  Skin:    General: Skin is warm.     Capillary Refill: Capillary refill takes less than 2 seconds.  Neurological:     Mental Status: She is alert.     ED Results / Procedures / Treatments   Labs (all labs ordered are listed, but only abnormal results are displayed) Labs Reviewed  BASIC METABOLIC PANEL - Abnormal; Notable for the following components:      Result Value   Glucose, Bld 102 (*)    Calcium 10.4 (*)    All other components within normal limits  CBC WITH DIFFERENTIAL/PLATELET  TROPONIN I (HIGH SENSITIVITY)  TROPONIN I (HIGH SENSITIVITY)    EKG EKG Interpretation  Date/Time:  Thursday December 30 2021 10:08:44 EDT Ventricular Rate:  55 PR Interval:  189 QRS Duration: 101 QT Interval:  419 QTC Calculation: 401 R Axis:   -43 Text Interpretation: Sinus rhythm Left ventricular hypertrophy No significant change since last tracing Confirmed by Davonna Belling [phone]) on 12/30/2021 10:18:56 AM  Radiology DG Chest Portable 1 View  Result Date: 12/30/2021 CLINICAL DATA:  Chest pain EXAM: PORTABLE CHEST 1 VIEW COMPARISON:  Portable exam 1017 hours compared to 01/28/2018 FINDINGS: Normal heart size, mediastinal contours, and pulmonary vascularity. Minimal bibasilar atelectasis. Lungs otherwise clear. No pulmonary infiltrate, pleural effusion, or pneumothorax. No acute osseous findings. IMPRESSION: Bibasilar atelectasis. Electronically Signed   By: Lavonia Dana M.D.   On: 12/30/2021 10:32   US Venous Img Lower Unilateral Left (DVT)  Result Date: 12/29/2021 CLINICAL DATA:  Left lower extremity pain and edema for the past 2-3 weeks. Evaluate for DVT. EXAM: LEFT LOWER EXTREMITY VENOUS DOPPLER ULTRASOUND TECHNIQUE: Gray-scale sonography with graded compression, as well as color Doppler and duplex ultrasound were performed to evaluate the lower extremity deep venous systems from the level of the common femoral vein and including the common  femoral, femoral, profunda femoral, popliteal and calf veins including the posterior tibial, peroneal and gastrocnemius veins when visible. The superficial great saphenous vein was also interrogated. Spectral Doppler was utilized to evaluate flow at rest and with distal augmentation maneuvers in the common femoral, femoral and popliteal veins. COMPARISON:  None Available. FINDINGS: Contralateral Common Femoral Vein: Respiratory phasicity is normal and symmetric with the symptomatic side. No evidence of thrombus. Normal compressibility. Common Femoral Vein: No evidence of thrombus. Normal compressibility, respiratory phasicity and response to augmentation. Saphenofemoral Junction: No evidence of thrombus. Normal compressibility and flow on color Doppler imaging. Profunda Femoral Vein: No evidence of thrombus. Normal compressibility and flow on color Doppler imaging. Femoral Vein: No evidence of thrombus. Normal compressibility, respiratory phasicity and response to augmentation. Popliteal Vein:  No evidence of thrombus. Normal compressibility, respiratory phasicity and response to augmentation. Calf Veins: No evidence of thrombus. Normal compressibility and flow on color Doppler imaging. Superficial Great Saphenous Vein: No evidence of thrombus. Normal compressibility. Other Findings:  None. IMPRESSION: No evidence of DVT within the left lower extremity. Electronically Signed   By: Sandi Mariscal M.D.   On: 12/29/2021 14:36    Procedures Procedures    Medications Ordered in ED Medications - No data to display  ED Course/ Medical Decision Making/ A&P                           Medical Decision Making Amount and/or Complexity of Data Reviewed Labs: ordered. Radiology: ordered.   Patient with anterior chest pain.  Worse with palpation.  Worse with certain movements.  Reproducible.  EKG reassuring.  Troponin negative.  Not tachycardic.  Doubt cardiac ischemia.  Troponin negative x2.  Doubt pulmonary  embolism.  Patient denied lower extremity pain but it does appear that she had recent ultrasound was negative the lower extremity.  EKG is reassuring.  And doubt cardiac ischemia.  No pneumonia.  Doubt aortic dissection.  Appears stable for discharge home with outpatient follow-up        Final Clinical Impression(s) / ED Diagnoses Final diagnoses:  Nonspecific chest pain    Rx / DC Orders ED Discharge Orders     None         Davonna Belling, MD 12/30/21 1329

## 2022-01-17 ENCOUNTER — Ambulatory Visit: Payer: Medicare PPO | Admitting: Internal Medicine

## 2022-01-17 ENCOUNTER — Encounter: Payer: Self-pay | Admitting: Internal Medicine

## 2022-01-17 VITALS — BP 110/62 | HR 58 | Ht 65.5 in | Wt 180.0 lb

## 2022-01-17 DIAGNOSIS — I10 Essential (primary) hypertension: Secondary | ICD-10-CM | POA: Diagnosis not present

## 2022-01-17 DIAGNOSIS — I2584 Coronary atherosclerosis due to calcified coronary lesion: Secondary | ICD-10-CM | POA: Diagnosis not present

## 2022-01-17 DIAGNOSIS — R0789 Other chest pain: Secondary | ICD-10-CM | POA: Diagnosis not present

## 2022-01-17 DIAGNOSIS — E785 Hyperlipidemia, unspecified: Secondary | ICD-10-CM | POA: Diagnosis not present

## 2022-01-17 DIAGNOSIS — I2510 Atherosclerotic heart disease of native coronary artery without angina pectoris: Secondary | ICD-10-CM

## 2022-01-17 DIAGNOSIS — I6522 Occlusion and stenosis of left carotid artery: Secondary | ICD-10-CM

## 2022-04-12 DIAGNOSIS — E1169 Type 2 diabetes mellitus with other specified complication: Secondary | ICD-10-CM | POA: Diagnosis not present

## 2022-04-12 DIAGNOSIS — M130 Polyarthritis, unspecified: Secondary | ICD-10-CM | POA: Diagnosis not present

## 2022-04-12 DIAGNOSIS — I10 Essential (primary) hypertension: Secondary | ICD-10-CM | POA: Diagnosis not present

## 2022-04-12 DIAGNOSIS — G4489 Other headache syndrome: Secondary | ICD-10-CM | POA: Diagnosis not present

## 2022-04-12 DIAGNOSIS — Z6829 Body mass index (BMI) 29.0-29.9, adult: Secondary | ICD-10-CM | POA: Diagnosis not present

## 2022-04-12 DIAGNOSIS — E119 Type 2 diabetes mellitus without complications: Secondary | ICD-10-CM | POA: Diagnosis not present

## 2022-04-20 ENCOUNTER — Encounter: Payer: Self-pay | Admitting: Neurology

## 2022-04-20 ENCOUNTER — Ambulatory Visit: Payer: Medicare PPO | Admitting: Neurology

## 2022-04-20 VITALS — BP 105/62 | HR 70 | Ht 60.0 in | Wt 179.0 lb

## 2022-04-20 DIAGNOSIS — R0683 Snoring: Secondary | ICD-10-CM

## 2022-04-20 DIAGNOSIS — R519 Headache, unspecified: Secondary | ICD-10-CM | POA: Diagnosis not present

## 2022-04-20 DIAGNOSIS — E66811 Obesity, class 1: Secondary | ICD-10-CM

## 2022-04-20 DIAGNOSIS — E669 Obesity, unspecified: Secondary | ICD-10-CM | POA: Diagnosis not present

## 2022-04-20 DIAGNOSIS — Z8669 Personal history of other diseases of the nervous system and sense organs: Secondary | ICD-10-CM | POA: Diagnosis not present

## 2022-04-20 DIAGNOSIS — G250 Essential tremor: Secondary | ICD-10-CM | POA: Diagnosis not present

## 2022-04-20 NOTE — Patient Instructions (Signed)
It was nice to see you again today.  As discussed, your headaches could be coming from the neck, they do not sound like migraines.  You have had migraines in the past.  We will go ahead and do a brain scan and a sleep study.  We will do a brain scan, called MRI and call you with the test results. We will have to schedule you for this on a separate date. This test requires authorization from your insurance, and we will take care of the insurance process.  Please remember, common headache triggers are: sleep deprivation, dehydration, overheating, stress, hypoglycemia or skipping meals and blood sugar fluctuations, excessive pain medications or excessive alcohol use or caffeine withdrawal. Some people have food triggers such as aged cheese, orange juice or chocolate, especially dark chocolate, or MSG (monosodium glutamate). Try to avoid these headache triggers as much possible. It may be helpful to keep a headache diary to figure out what makes your headaches worse or brings them on and what alleviates them. Some people report headache onset after exercise but studies have shown that regular exercise may actually prevent headaches from coming. If you have exercise-induced headaches, please make sure that you drink plenty of fluid before and after exercising and that you do not over do it and do not overheat.   As discussed, underlying obstructive sleep apnea could be a possibility and we can check with a sleep study and consider treatment with a CPAP or AutoPap machine, similar to what your husband has.  Our sleep lab administrative assistant will call you to schedule your sleep study. If you don't hear back from her by about 2 weeks from now, please feel free to call her at [phone]. You can leave a message with your phone number and concerns, if you get the voicemail box. She will call back as soon as possible.   Please continue to exercise regularly, stay active, hydrate well with water and get enough  rest at night.  We will call you with the results, we will not start any new medication at this time, you can continue to take Tylenol as needed.

## 2022-04-20 NOTE — Progress Notes (Signed)
Subjective:    Patient ID: GEOVANNA SIMKO is a 81 y.o. female.  HPI    Star Age, MD, PhD [facility] [address] [place], [place] [address]  Dear Dr. Criss Rosales,  I saw your patient, Elowen Debruyn, upon your kind request in my neurologic clinic today for initial consultation of her recurrent headaches.  The patient is unaccompanied today.  As you know, Ms. Burbano is a 81 year old right-handed woman with an underlying medical history of anemia, arthritis, coronary artery disease, hyperlipidemia, glaucoma, hypertension, tremors, and obesity, who reports an approximately 5-week history of recurrent headaches.  Headaches are occipital, happen about twice a week, are more dull and achy, sometimes sharp.  She has no associated nausea or vomiting, no associated photophobia.  Headaches are not debilitating, she does typically take Tylenol, 1 pill, sometimes a second pill.  She used to have migraines, stopped having them when she turned 52 or thereabouts.  Her migraines were very severe, debilitating, she had associated light sensitivity and nausea.  Her headaches currently are nothing like her migraines.  She denies any sudden onset of one-sided weakness or numbness or tingling or droopy face or slurring of speech.  Epworth sleepiness score is 8 out of 24, fatigue severity score is 17 out of 63.  She did not start any new medications recently.  She does not drink caffeine on a day-to-day basis.  She goes to bed between 11 and midnight and rise time is somewhere between 6 and 7, rarely 8.  She denies night to night nocturia.  She is not aware of any family history of sleep apnea.  Weight has been fluctuating a little bit.  She tries to hydrate well with water.  She has had recent stress, her daughter-in-law had to have surgery yesterday.  She did not get enough sleep because she was in the hospital supporting her son.  She does report not always sleeping well  and having some difficulty going to sleep.  She does not take anything to help her sleep.  She snores some per husband.  Her husband has a sleep apnea machine.  She takes allergy medication seasonal, typically in the spring and fall.  She had her eye examination in January and had an updated set of eyeglasses.  She denies any blurry vision or double vision.  She does have residual neck pain and she has seen orthopedics for this.  I have evaluated her and followed her for her essential tremor for the past 5 years or so.  I last saw her on 09/30/21, at which time she reported a fairly stable tremor.  She had noticed flareup of her tremor when she was nervous.  She had a prescription for gabapentin from her orthopedic surgeon as well as a prescription for muscle relaxer for shoulder pain but she was very cautious with her medications.  She had received a cortisone injection and had been in physical therapy for neck pain.  I reviewed your office note from 04/12/2022.  She reports no longer taking her Robaxin or gabapentin.  She took these medicines for about 2 to 3 weeks.  She did have some side effects including grogginess and drugged feeling the next day.  She had blood work through your office on 04/12/2022 and I reviewed the results.  Lipid panel showed total cholesterol of 162, HDL 68, triglycerides 107, LDL 75.  CMP showed glucose of 99, BUN 15, creatinine 1.97, calcium mildly elevated at 11.6.  Alk phos 67, AST 24, ALT 17, CBC with differential and platelets benign.  She reports that she has had an elevated calcium level and has seen endocrinology.  They have just continue to monitor.  She does not take any calcium supplement any longer.  She was advised to stop her calcium supplementation when she saw endocrinology.  She had a remote brain MRI with and without contrast, as well as MR angiogram of the head without contrast on 08/22/2003 for indication of headaches. I reviewed the results:  IMPRESSION:  Normal  MRI of the brain.     Normal intracranial MRA.    The patient's allergies, current medications, family history, past medical history, past social history, past surgical history and problem list were reviewed and updated as appropriate.    Previously (copied from previous notes for reference):   I saw her on 09/24/2020, at which time she was stable with her tremor. She reported left-sided neck pain.    I saw her on 09/25/19, at which time she was stable with her tremor.    I saw her on 09/20/2018, at which time she reported doing well from the tremor standpoint.  She did have a fall in December 2019, and hurt her right shoulder.  She had cardiac work-up because of chest pain which was negative.  She was felt to have reflux related symptoms.     I saw her on 09/18/2017, at which time she felt her tremor was stable. Her tremor was mostly noticeable when she was fatigued. She did have some in from issues with low back pain and joint pain. We mutually agreed to follow her clinically.     I first met her on 09/14/2016 at the request of her primary care physician, at which time she reported a long-standing history of tremors. I suggested observation of her overall mild head tremor, and recheck in one year routinely.   09/14/2016: (She) reports a long-standing history of tremors. Some 6 weeks ago she had an exacerbation of her tremor and it affected her whole body at the time. She did have joint pain at the time and also flareup in her headaches. She has a remote history of migraine headaches but they improved significantly after she had her hysterectomy in 1995. She has occasional snoring, no morning headaches.  She has previously been seen by Dr. Erling Cruz in our office for many years. I reviewed an office note by Dr. Erling Cruz from 11/30/2010, at which time he talk to her about her head tremor and headaches. He had seen her many years ago in 2005 for recurrent headaches. She reported in 2012 that her head tremor was  getting worse. She declined symptomatic medication at the time. She also had symptomatic dizziness at the time. I also reviewed an office note by Dr. love from 04/11/2008, at which time he reported that she had a long-standing history of essential tremor for which he saw her in 2005. He also saw her for headaches. He also mentions a prior visit from November 1998 for vertigo and essential tremor. He reported in 2009 that she had never been on Topamax her beta blocker. He suggested trial of Inderal versus Topamax but was hesitant to suggest Inderal because of prior history of pulmonary problems. They mutually agreed to forego treatment at the time. I reviewed blood test results from your office from 08/19/2016. CMP was unremarkable with the exception of calcium borderline at 11. CBC with differential was unremarkable. ESR was normal. Total cholesterol was  139, triglycerides 129, LDL 54. TSH normal. Hemoglobin A1c was 5.7, free T4 and free T3 were normal. Vitamin D was borderline at 29. She had a brain MRI with and without contrast on 08/22/2003 as well as a brain MRA without contrast on 08/22/2003 which I reviewed: Brain MRA was normal, brain MRI was normal. She had a CTH wo contrast on 10/30/13: normal.  She had a recent nuclear stress test under Dr. Debara Pickett on 08/30/16, and I reviewed the results: Normal EF and no ischemia.  [facility], even lost weight, but d/t to her husband's health issues did not go as regularly as she did not want to leave him alone at home.  She reports a recent incident of whole body trembling and had a few other episodes since then. She has a history of arthritis and had joint pains in both ankles and both knees and was given a tapering course of prednisone for 2 weeks, which started a week after the increase in tremor.  She has since then seen a rheumatologist last week, had a number of X rays, was told she has OA. She may have had a viral illness, her  rheumatologist suspected d/t to flare up of joint pain and headaches last month.  Overall, she feels that since the flareup of her tremor she has reverted back to her baseline. She is not particularly bothered by her head tremor and does not report any hand tremor. She has a strong family history of tremors. Her father had hand tremors, she had a total of 9 siblings out of which 6 had some form of tremor including head, voice or hand tremors. She is very active, she is still able to do everything around the house, she drives without problems. She does not drink alcohol regularly, only on special occasions, she does not drink caffeine daily, coffee maybe 3 cups per week. She has 2 grown children. She lives with her husband, son and daughter-in-law. She is retired. She has occasional forgetfulness and mostly difficulty with names but nothing sinister.    Her Past Medical History Is Significant For: Past Medical History:  Diagnosis Date   Anemia    Arthritis    Coronary artery disease    moderate, by cath   Dyslipidemia    Fibroid    Glaucoma 09/25/2017   patient denies having this or being diagnosed with this ever   History of nuclear stress test 05/2010   bruce myoview; small fixed distal anteroapical, apical defect (breast attenuation or apical thinning), no reversible ischemia, post-stress EF 83%, abnormal but low risk study    Hyperlipidemia    Hypertension    Tremor    Vaginal dryness     Her Past Surgical History Is Significant For: Past Surgical History:  Procedure Laterality Date   ABDOMINAL HYSTERECTOMY  1995   BREAST SURGERY     Biopsy negative   CARDIAC CATHETERIZATION  05/31/2007   no signficant CAD, low normal EF (Dr. Gerrie Nordmann)    CATARACT EXTRACTION W/PHACO Right 03/02/2015   Procedure: CATARACT EXTRACTION PHACO AND INTRAOCULAR LENS PLACEMENT RIGHT EYE CDE=5.78;  Surgeon: [name], MD;  Location: [facility];  Service: Ophthalmology;  Laterality: Right;   CATARACT EXTRACTION  W/PHACO Left 03/19/2015   Procedure: CATARACT EXTRACTION PHACO AND INTRAOCULAR LENS PLACEMENT LEFT EYE CDE=5.44;  Surgeon: [name], MD;  Location: [facility];  Service: Ophthalmology;  Laterality: Left;   TRANSTHORACIC ECHOCARDIOGRAM  2005  EF 60% with borderline conc LVH; mild TR, RVSP 27mHg    TUBAL LIGATION  1977    Her Family History Is Significant For: Family History  Problem Relation Age of Onset   Hypertension Mother    Sudden death Mother    Heart disease Father    Kidney failure Father    Cancer Sister        uterine   Heart failure Brother        DM, HTN   Aneurysm Brother        abdominal; also HTN   Hypertension Brother        sudden death   Hypertension Brother    Hyperlipidemia Brother        also DM   Hypertension Brother    Other Brother        2 brain surgeries, bleeding   Migraines Neg Hx    Headache Neg Hx     Her Social History Is Significant For: Social History   Socioeconomic History   Marital status: Married    Spouse name: Not on file   Number of children: Not on file   Years of education: Not on file   Highest education level: Not on file  Occupational History   Not on file  Tobacco Use   Smoking status: Never   Smokeless tobacco: Never  Vaping Use   Vaping Use: Never used  Substance and Sexual Activity   Alcohol use: No   Drug use: No   Sexual activity: Not on file    Comment: Hysterectomy  Other Topics Concern   Not on file  Social History Narrative   Not on file   Social Determinants of Health   Financial Resource Strain: Not on file  Food Insecurity: Not on file  Transportation Needs: Not on file  Physical Activity: Not on file  Stress: Not on file  Social Connections: Not on file    Her Allergies Are:  Allergies  Allergen Reactions   Hydrocodone Nausea And Vomiting   Molds & Smuts     Other reaction(s): Other (See Comments)   Zocor [Simvastatin]     myalgias   Latex Rash   Penicillins Rash    Has patient had a  PCN reaction causing immediate rash, facial/tongue/throat swelling, SOB or lightheadedness with hypotension: Unknown Has patient had a PCN reaction causing severe rash involving mucus membranes or skin necrosis: Yes Has patient had a PCN reaction that required hospitalization: No Has patient had a PCN reaction occurring within the last 10 years: No If all of the above answers are "NO", then may proceed with Cephalosporin use.  :   Her Current Medications Are:  Outpatient Encounter Medications as of 04/20/2022  Medication Sig   azelastine (ASTELIN) 137 MCG/SPRAY nasal spray Place 1 spray into the nose at bedtime. Use in each nostril as directed   cholecalciferol (VITAMIN D) 1000 units tablet Take 2,000 Units by mouth daily.   Cinnamon 500 MG capsule Take 500 mg by mouth daily.   cycloSPORINE (RESTASIS) 0.05 % ophthalmic emulsion Place 1 drop into both eyes 2 (two) times daily.    desonide (DESOWEN) 0.05 % cream Apply topically as needed.   diclofenac Sodium (VOLTAREN) 1 % GEL Apply topically 4 (four) times daily.   doxycycline (VIBRAMYCIN) 100 MG capsule Take 100 mg by mouth 2 (two) times daily.   FLAX OIL-FISH OIL-BORAGE OIL PO Take 1 capsule by mouth daily.    nitroGLYCERIN (NITROLINGUAL) 0.4 MG/SPRAY spray Place  1 spray under the tongue every 5 (five) minutes x 3 doses as needed for chest pain. (Patient taking differently: Place 1 spray under the tongue as needed for chest pain.)   nystatin cream (MYCOSTATIN) Apply topically 2 (two) times daily.   pantoprazole (PROTONIX) 40 MG tablet Take 40 mg by mouth daily.   rosuvastatin (CRESTOR) 5 MG tablet Take 5 mg by mouth every evening.    telmisartan-hydrochlorothiazide (MICARDIS HCT) 80-12.5 MG per tablet Take 1 tablet by mouth daily.   gabapentin (NEURONTIN) 100 MG capsule Take 1 capsule (100 mg total) by mouth at bedtime. As needed for tingling   guaiFENesin (MUCINEX) 600 MG 12 hr tablet Take 1 tablet (600 mg total) by mouth 2 (two) times daily  as needed.   methocarbamol (ROBAXIN) 500 MG tablet TAKE 1 TABLET BY MOUTH EVERYDAY AT BEDTIME (Patient not taking: Reported on 04/20/2022)   sucralfate (CARAFATE) 1 g tablet Take 1 tablet (1 g total) by mouth 4 (four) times daily -  with meals and at bedtime.   No facility-administered encounter medications on file as of 04/20/2022.  :   Review of Systems:  Out of a complete 14 point review of systems, all are reviewed and negative with the exception of these symptoms as listed below:   Review of Systems  Neurological:        Pt here for headaches  Pt states 8-10 headaches in last month Pt states headache pain in back of head . Pt states neck hurts .    ESS FSS    Objective:  Neurological Exam  Physical Exam Physical Examination:   Vitals:   04/20/22 1313  BP: 105/62  Pulse: 70    General Examination: The patient is a very pleasant 81 y.o. female in no acute distress. She appears well-developed and well-nourished and well groomed.   HEENT: Normocephalic, atraumatic, pupils are equal, round and reactive to light, extraocular tracking is well-preserved, corrective eyeglasses in place.  Status post cataract repairs bilaterally, no photophobia.  Hearing grossly intact, face is symmetric with normal facial animation, no dysarthria, intermittent voice tremor noted.  She has a stable head tremor, neck is supple otherwise, no carotid bruits.  Oropharynx exam reveals: mild mouth dryness, adequate dental hygiene, Mallampati class III.  Neck circumference 14-1/2 inches.  Slightly wider uvula noted, tonsils small, small airway entry. Tongue protrudes centrally and palate elevates symmetrically.    Chest: Clear to auscultation without wheezing, rhonchi or crackles noted.   Heart: S1+S2+0, regular and normal without murmurs, rubs or gallops noted.    Abdomen: Soft, non-tender and non-distended.   Extremities: There is no pitting edema in the distal lower extremities bilaterally.   Skin:  Warm and dry without trophic changes noted.   Musculoskeletal: exam reveals no obvious joint deformities, decreased range of motion in L shoulder, stable.     Neurologically:  Mental status: The patient is awake, alert and oriented in all 4 spheres. Her immediate and remote memory, attention, language skills and fund of knowledge are appropriate. There is no evidence of aphasia, agnosia, apraxia or anomia. Speech is clear with normal prosody and enunciation. Thought process is linear. Mood is normal and affect is normal.  Cranial nerves II - XII are as described above under HEENT exam. Motor exam: Normal bulk, strength and tone is noted. There is no drift, or resting tremor.  No rebound.  Reflexes 1+ throughout trace in the ankles.  (On 09/14/2016: Handwriting is not micrographic, very legible. On Archimedes spiral  drawing she has no difficulty with her dominant hand which is the right and minimal trembling noted on the left).    She has no significant postural or action tremor in the UEs, no lower extremity tremor.  Mild trembling with handwriting.  Fine motor skills and coordination: grossly intact for age.   Cerebellar testing: No dysmetria or intention tremor. There is no truncal or gait ataxia.  Normal finger-to-nose and normal heel-to-shin. Sensory exam: intact to light touch.  Gait, station and balance: She stands easily. No veering to one side is noted. No leaning to one side is noted. Posture is age-appropriate and stance is narrow based. Gait shows normal stride length and normal pace. No problems turning are noted.  No walking aid.        Assessment and Plan:  In summary, AAYLAH POKORNY is a very pleasant 81 year old female with an underlying medical history of allergies, hypertension, reflux disease and overweight state, who presents for evaluation of her recent onset of headaches in the occipital area.  She describes a dull, achy type of headache, could be tension type, could be  cervicogenic, unlikely migraines.  She used to have migraines in the past and her migraines were much different and much more debilitating.  She currently does not have any associated neurological accompaniments, no nausea or vomiting, no photophobia or visual symptoms in general.  She does not always take medication for her headache.  Underlying obstructive sleep disordered breathing is also a possibility.  Neurological exam is stable and nonfocal other than known tremor.  Prior testing included head CT and she also has had a brain MRI and MR angiogram in the past. She has experienced neck pain in the past year, she also had left shoulder pain.  She has seen orthopedics and has had physical therapy.  She received a steroid shot into the left shoulder in the recent past.   A brain MRI with and without contrast to rule out a structural cause of her headaches and I would like to proceed with a sleep study to look for the possibility of underlying sleep apnea.  She is familiar with the diagnosis of sleep apnea and with CPAP or AutoPap therapy as her husband has machine.  We will keep her posted as to her test results by phone call for now and follow-up accordingly.  She may be a candidate for an AutoPap machine.  She is advised to continue to try to hydrate well with water and use Tylenol as needed.  We mutually agreed not to start any new medications from my end of things.  I answered all her questions today and she was in agreement with our plan.  Thank you very much for allowing me to participate in the care of this nice patient. If I can be of any further assistance to you please do not hesitate to call me at [phone].  Sincerely,   Star Age, MD, PhD

## 2022-04-29 ENCOUNTER — Telehealth: Payer: Self-pay | Admitting: Neurology

## 2022-04-29 NOTE — Telephone Encounter (Signed)
Mcarthur Rossetti Josem Kaufmann: [id number] exp.  04/29/22-05/29/22 sent to GI [phone]

## 2022-05-24 ENCOUNTER — Ambulatory Visit
Admission: RE | Admit: 2022-05-24 | Discharge: 2022-05-24 | Disposition: A | Discharge status: Home / Self Care | Payer: Medicare PPO | Source: Ambulatory Visit | Attending: Neurology | Admitting: Neurology

## 2022-05-24 DIAGNOSIS — G250 Essential tremor: Secondary | ICD-10-CM

## 2022-05-24 DIAGNOSIS — R0683 Snoring: Secondary | ICD-10-CM

## 2022-05-24 DIAGNOSIS — R519 Headache, unspecified: Secondary | ICD-10-CM

## 2022-05-24 DIAGNOSIS — Z8669 Personal history of other diseases of the nervous system and sense organs: Secondary | ICD-10-CM

## 2022-05-24 DIAGNOSIS — E669 Obesity, unspecified: Secondary | ICD-10-CM

## 2022-05-24 MED ORDER — GADOPICLENOL 0.5 MMOL/ML IV SOLN
8.0000 mL | Freq: Once | INTRAVENOUS | Status: AC | PRN
  Administered 2022-05-24: 8 mL via INTRAVENOUS

## 2022-05-30 ENCOUNTER — Telehealth: Payer: Self-pay | Admitting: Neurology

## 2022-05-30 ENCOUNTER — Telehealth: Payer: Self-pay | Admitting: *Deleted

## 2022-05-30 NOTE — Telephone Encounter (Signed)
[employer] pending uploaded notes

## 2022-05-30 NOTE — Telephone Encounter (Signed)
-----   Message from Star Age, MD sent at 05/30/2022 10:36 AM EST ----- Please call patient and advise the pt that her recent brain MRI did not show any acute findings and no cause for her headaches.  As discussed, we will proceed with a sleep study, I ordered a sleep study in September.   Raquel Sarna and Meagan: Please look into the delay in scheduling.

## 2022-05-30 NOTE — Telephone Encounter (Signed)
Left message for patient to call office back to get MRI results

## 2022-06-01 NOTE — Telephone Encounter (Signed)
Spoke with patient and discussed her MRI results. I see the insurance auth for sleep study was submitted again on 11/6. I told the pt that it was pending and if she doesn't hear back within 1-2 weeks to schedule then please call our office. Pt was very appreciative and verbalized understanding.

## 2022-06-01 NOTE — Telephone Encounter (Signed)
Pt is asking for a call back to discuss results to MRI

## 2022-06-02 NOTE — Telephone Encounter (Signed)
[employer] Josem Kaufmann: [id number] (exp. 05/30/22 to 08/28/22).  Patient is scheduled at [facility] for 08/14/22 at 8 pm.  Mailed packet to the patient.

## 2022-06-02 NOTE — Telephone Encounter (Signed)
[employer] Josem Kaufmann: [id number] (exp. 05/30/22 to 08/28/22).  Patient is scheduled at [facility] for 08/14/22 at 8 pm.  Mailed packet to the patient.

## 2022-06-21 ENCOUNTER — Emergency Department (HOSPITAL_COMMUNITY): Payer: Medicare PPO

## 2022-06-21 ENCOUNTER — Emergency Department (HOSPITAL_COMMUNITY)
Admission: EM | Admit: 2022-06-21 | Discharge: 2022-06-22 | Disposition: A | Discharge status: Home / Self Care | Payer: Medicare PPO | Attending: Emergency Medicine | Admitting: Emergency Medicine

## 2022-06-21 ENCOUNTER — Other Ambulatory Visit: Payer: Self-pay

## 2022-06-21 ENCOUNTER — Encounter (HOSPITAL_COMMUNITY): Payer: Self-pay

## 2022-06-21 DIAGNOSIS — R202 Paresthesia of skin: Secondary | ICD-10-CM | POA: Insufficient documentation

## 2022-06-21 DIAGNOSIS — R251 Tremor, unspecified: Secondary | ICD-10-CM | POA: Insufficient documentation

## 2022-06-21 DIAGNOSIS — I639 Cerebral infarction, unspecified: Secondary | ICD-10-CM | POA: Insufficient documentation

## 2022-06-21 DIAGNOSIS — I10 Essential (primary) hypertension: Secondary | ICD-10-CM | POA: Diagnosis not present

## 2022-06-21 DIAGNOSIS — R29818 Other symptoms and signs involving the nervous system: Secondary | ICD-10-CM | POA: Diagnosis not present

## 2022-06-21 DIAGNOSIS — Z79899 Other long term (current) drug therapy: Secondary | ICD-10-CM | POA: Insufficient documentation

## 2022-06-21 DIAGNOSIS — R519 Headache, unspecified: Secondary | ICD-10-CM | POA: Insufficient documentation

## 2022-06-21 DIAGNOSIS — Z91040 Latex allergy status: Secondary | ICD-10-CM | POA: Insufficient documentation

## 2022-06-21 DIAGNOSIS — R200 Anesthesia of skin: Secondary | ICD-10-CM | POA: Diagnosis not present

## 2022-06-21 LAB — COMPREHENSIVE METABOLIC PANEL
ALT: 25 U/L (ref 0–44)
AST: 31 U/L (ref 15–41)
Albumin: 4 g/dL (ref 3.5–5.0)
Alkaline Phosphatase: 69 U/L (ref 38–126)
Anion gap: 8 (ref 5–15)
BUN: 15 mg/dL (ref 8–23)
CO2: 21 mmol/L — ABNORMAL LOW (ref 22–32)
Calcium: 10.5 mg/dL — ABNORMAL HIGH (ref 8.9–10.3)
Chloride: 110 mmol/L (ref 98–111)
Creatinine, Ser: 0.99 mg/dL (ref 0.44–1.00)
GFR, Estimated: 58 mL/min — ABNORMAL LOW (ref >60)
Glucose, Bld: 124 mg/dL — ABNORMAL HIGH (ref 70–99)
Potassium: 3.6 mmol/L (ref 3.5–5.1)
Sodium: 139 mmol/L (ref 135–145)
Total Bilirubin: 0.7 mg/dL (ref 0.3–1.2)
Total Protein: 7.1 g/dL (ref 6.5–8.1)

## 2022-06-21 LAB — PROTIME-INR
INR: 1 (ref 0.8–1.2)
Prothrombin Time: 13.4 seconds (ref 11.4–15.2)

## 2022-06-21 LAB — DIFFERENTIAL
Abs Immature Granulocytes: 0.01 10*3/uL (ref 0.00–0.07)
Basophils Absolute: 0 10*3/uL (ref 0.0–0.1)
Basophils Relative: 1 %
Eosinophils Absolute: 0.1 10*3/uL (ref 0.0–0.5)
Eosinophils Relative: 1 %
Immature Granulocytes: 0 %
Lymphocytes Relative: 41 %
Lymphs Abs: 2.4 10*3/uL (ref 0.7–4.0)
Monocytes Absolute: 0.4 10*3/uL (ref 0.1–1.0)
Monocytes Relative: 7 %
Neutro Abs: 2.9 10*3/uL (ref 1.7–7.7)
Neutrophils Relative %: 50 %

## 2022-06-21 LAB — RAPID URINE DRUG SCREEN, HOSP PERFORMED
Amphetamines: NOT DETECTED
Barbiturates: NOT DETECTED
Benzodiazepines: NOT DETECTED
Cocaine: NOT DETECTED
Opiates: NOT DETECTED
Tetrahydrocannabinol: NOT DETECTED

## 2022-06-21 LAB — URINALYSIS, ROUTINE W REFLEX MICROSCOPIC
Bilirubin Urine: NEGATIVE
Glucose, UA: NEGATIVE mg/dL
Hgb urine dipstick: NEGATIVE
Ketones, ur: NEGATIVE mg/dL
Leukocytes,Ua: NEGATIVE
Nitrite: NEGATIVE
Protein, ur: NEGATIVE mg/dL
Specific Gravity, Urine: 1.017 (ref 1.005–1.030)
pH: 5 (ref 5.0–8.0)

## 2022-06-21 LAB — CBC
HCT: 44.7 % (ref 36.0–46.0)
Hemoglobin: 14.5 g/dL (ref 12.0–15.0)
MCH: 31 pg (ref 26.0–34.0)
MCHC: 32.4 g/dL (ref 30.0–36.0)
MCV: 95.7 fL (ref 80.0–100.0)
Platelets: 204 10*3/uL (ref 150–400)
RBC: 4.67 MIL/uL (ref 3.87–5.11)
RDW: 13.3 % (ref 11.5–15.5)
WBC: 5.8 10*3/uL (ref 4.0–10.5)
nRBC: 0 % (ref 0.0–0.2)

## 2022-06-21 LAB — APTT: aPTT: 31 seconds (ref 24–36)

## 2022-06-21 LAB — ETHANOL: Alcohol, Ethyl (B): <10 mg/dL (ref <10)

## 2022-06-21 NOTE — ED Triage Notes (Signed)
C/o HA and L facial numbness ; HA x a few days, L facial numbness numbness started around 1600 this afternoon; denies stroke hx; hx HTN, takes medication; denies weakness, endorses some fatigue; denies changed to speech, vision; endorses some soreness in L neck, shoulder, hx arthritis; no facial droop, no aphasia, equal strength bilaterally in all extremities, no drift, PERRLA

## 2022-06-21 NOTE — ED Provider Triage Note (Signed)
Emergency Medicine Provider Triage Evaluation Note  Nicole Meadows , a 81 y.o. female  was evaluated in triage.  Pt complains of left-sided facial numbness onset at 1600.  Patient states that she has been having months of headache across her head.  She had an MRI recently that was negative.  She has no other abnormal complaints such as weakness, difficulty with speech or swallowing, ataxia, vision changes..  Review of Systems  Positive: Headache and facial numbness Negative: Weaknes  Physical Exam  There were no vitals taken for this visit. Gen:   Awake, no distress   Resp:  Normal effort  MSK:   Moves extremities without difficulty  Other: No facial asymmetry  Medical Decision Making  Medically screening exam initiated at 5:38 PM.  Appropriate orders placed.  SWETA HALSETH was informed that the remainder of the evaluation will be completed by another provider, this initial triage assessment does not replace that evaluation, and the importance of remaining in the ED until their evaluation is complete.     Margarita Mail, PA-C 06/21/22 1740

## 2022-06-22 LAB — I-STAT CHEM 8, ED
BUN: 18 mg/dL (ref 8–23)
Calcium, Ion: 1.25 mmol/L (ref 1.15–1.40)
Chloride: 108 mmol/L (ref 98–111)
Creatinine, Ser: 1 mg/dL (ref 0.44–1.00)
Glucose, Bld: 126 mg/dL — ABNORMAL HIGH (ref 70–99)
HCT: 43 % (ref 36.0–46.0)
Hemoglobin: 14.6 g/dL (ref 12.0–15.0)
Potassium: 3.6 mmol/L (ref 3.5–5.1)
Sodium: 139 mmol/L (ref 135–145)
TCO2: 23 mmol/L (ref 22–32)

## 2022-06-22 MED ORDER — BUTALBITAL-APAP-CAFFEINE 50-325-40 MG PO TABS
1.0000 | ORAL_TABLET | Freq: Once | ORAL | Status: AC
  Administered 2022-06-22: 1 via ORAL
  Filled 2022-06-22: qty 1

## 2022-06-22 MED ORDER — BUTALBITAL-APAP-CAFFEINE 50-325-40 MG PO TABS: 1.0000 | ORAL_TABLET | Freq: Four times a day (QID) | ORAL | 0 refills | Status: AC | PRN

## 2022-06-22 NOTE — ED Provider Notes (Signed)
[facility] EMERGENCY DEPARTMENT Provider Note   CSN: [id number] Arrival date & time: 06/21/22  1659     History  Chief Complaint  Patient presents with   Numbness    Nicole Meadows is a 81 y.o. female.  81 year old female who presents the ER today secondary to headache.  Patient has a history of migraines in the past and this does not feel that.  She states over the last couple months pretty much every day she has had a headache that wraps around her forehead above her ears to her occiput.  She actually saw her neurologist about this and they did an MRI which was negative back in beginning of November.  She states that any concern today is that she had numbness in her left cheek.  When I ask if it was more tingling or more true numbness the way she describes it makes me think it is more paresthesias.  Her husband was present while this happened he states that he did not notice any facial asymmetry, she was speaking normally.  Her vision was normal.  Her upper and lower extremity strength were normal to the point where she is able to walk into the [employer] to talk to them about it.  She came here for further evaluation.  Review of systems she states she does have paresthesias oftentimes at night mostly in her right hand with symptoms in her left hand to it is becoming more frequent.  She also has a known tremor that is genetic but she does not have any other real diagnosis for that.  She has been seeing neurology for it.  Neurology also wants to do a sleep study to see if that is contributing to her headaches.  She has been in the ER for over 9 hours at this point she said that she had some intermittent floaters which she has had with her cataract surgery in the past.  Nothing at this time.  No vision changes.        Home Medications Prior to Admission medications   Medication Sig Start Date End Date Taking? Authorizing Provider  butalbital-acetaminophen-caffeine  (FIORICET) 50-325-40 MG tablet Take 1-2 tablets by mouth every 6 (six) hours as needed for headache. 06/22/22 06/22/23 Yes Quinta Eimer, Corene Cornea, MD  azelastine (ASTELIN) 137 MCG/SPRAY nasal spray Place 1 spray into the nose at bedtime. Use in each nostril as directed    [provider]  cholecalciferol (VITAMIN D) 1000 units tablet Take 2,000 Units by mouth daily.    [provider]  Cinnamon 500 MG capsule Take 500 mg by mouth daily.    [provider]  cycloSPORINE (RESTASIS) 0.05 % ophthalmic emulsion Place 1 drop into both eyes 2 (two) times daily.     [provider]  desonide (DESOWEN) 0.05 % cream Apply topically as needed.    [provider]  diclofenac Sodium (VOLTAREN) 1 % GEL Apply topically 4 (four) times daily.    [provider]  doxycycline (VIBRAMYCIN) 100 MG capsule Take 100 mg by mouth 2 (two) times daily. 12/22/21   [provider]  FLAX OIL-FISH OIL-BORAGE OIL PO Take 1 capsule by mouth daily.     [provider]  gabapentin (NEURONTIN) 100 MG capsule Take 1 capsule (100 mg total) by mouth at bedtime. As needed for tingling 03/31/21   Carole Civil, MD  guaiFENesin (MUCINEX) 600 MG 12 hr tablet Take 1 tablet (600 mg total) by mouth 2 (two)  times daily as needed. 08/17/21   Volney American, PA-C  nitroGLYCERIN (NITROLINGUAL) 0.4 MG/SPRAY spray Place 1 spray under the tongue every 5 (five) minutes x 3 doses as needed for chest pain. Patient taking differently: Place 1 spray under the tongue as needed for chest pain. 08/25/15   Hilty, Nadean Corwin, MD  nystatin cream (MYCOSTATIN) Apply topically 2 (two) times daily.    [provider]  pantoprazole (PROTONIX) 40 MG tablet Take 40 mg by mouth daily. 11/28/21   [provider]  rosuvastatin (CRESTOR) 5 MG tablet Take 5 mg by mouth every evening.     [provider]  sucralfate (CARAFATE) 1 g tablet Take 1 tablet (1 g total) by mouth 4 (four)  times daily -  with meals and at bedtime. 01/28/18   Dorie Rank, MD  telmisartan-hydrochlorothiazide (MICARDIS HCT) 80-12.5 MG per tablet Take 1 tablet by mouth daily. 07/08/13   Hilty, Nadean Corwin, MD      Allergies    Hydrocodone, Molds & smuts, Zocor [simvastatin], Latex, and Penicillins    Review of Systems   Review of Systems  Physical Exam Updated Vital Signs BP (!) 141/69 (BP Location: Left Arm)   Pulse (!) 55   Temp 97.8 F (36.6 C)   Resp 16   SpO2 97%  Physical Exam Vitals and nursing note reviewed.  Constitutional:      Appearance: She is well-developed.  HENT:     Head: Normocephalic and atraumatic.     Mouth/Throat:     Mouth: Mucous membranes are moist.     Pharynx: Oropharynx is clear.  Eyes:     Pupils: Pupils are equal, round, and reactive to light.  Cardiovascular:     Rate and Rhythm: Normal rate and regular rhythm.  Pulmonary:     Effort: No respiratory distress.     Breath sounds: No stridor.  Abdominal:     General: There is no distension.     Tenderness: There is no abdominal tenderness.  Musculoskeletal:     Cervical back: Normal range of motion.  Neurological:     General: No focal deficit present.     Mental Status: She is alert.     Comments: Vision at baseline per patient with corrective lenses No obvious forehead or mouth asymmetry with smiling or raising eyebrows.  Able to close her eyes tightly on both sides.  Pupils equal round reactive to light.  Extraocular movements intact.  Tongue and uvula at midline.  Shoulder shrug is symmetric and normal.  Grip strength and bicep strength is symmetric and normal.  Dorsiflexion plantarflexion and leg raise is symmetric on both sides.     ED Results / Procedures / Treatments   Labs (all labs ordered are listed, but only abnormal results are displayed) Labs Reviewed  COMPREHENSIVE METABOLIC PANEL - Abnormal; Notable for the following components:      Result Value   CO2 21 (*)    Glucose, Bld 124  (*)    Calcium 10.5 (*)    GFR, Estimated 58 (*)    All other components within normal limits  ETHANOL  PROTIME-INR  APTT  CBC  DIFFERENTIAL  RAPID URINE DRUG SCREEN, HOSP PERFORMED  URINALYSIS, ROUTINE W REFLEX MICROSCOPIC  I-STAT CHEM 8, ED    EKG None  Radiology MR BRAIN WO CONTRAST  Result Date: 06/21/2022 CLINICAL DATA:  Acute neurologic deficit EXAM: MRI HEAD WITHOUT CONTRAST TECHNIQUE: Multiplanar, multiecho pulse sequences of the brain and surrounding structures were  obtained without intravenous contrast. COMPARISON:  05/24/2022 FINDINGS: Brain: No acute infarct, mass effect or extra-axial collection. No acute or chronic hemorrhage. There is multifocal hyperintense T2-weighted signal within the white matter. Parenchymal volume and CSF spaces are normal. The midline structures are normal. Vascular: Major flow voids are preserved. Skull and upper cervical spine: Normal calvarium and skull base. Visualized upper cervical spine and soft tissues are normal. Sinuses/Orbits:No paranasal sinus fluid levels or advanced mucosal thickening. No mastoid or middle ear effusion. Normal orbits. IMPRESSION: 1. No acute intracranial abnormality. 2. Findings of chronic small vessel ischemia. Electronically Signed   By: Ulyses Jarred M.D.   On: 06/21/2022 20:55   CT HEAD WO CONTRAST  Result Date: 06/21/2022 CLINICAL DATA:  Left-sided facial numbness EXAM: CT HEAD WITHOUT CONTRAST TECHNIQUE: Contiguous axial images were obtained from the base of the skull through the vertex without intravenous contrast. RADIATION DOSE REDUCTION: This exam was performed according to the departmental dose-optimization program which includes automated exposure control, adjustment of the mA and/or kV according to patient size and/or use of iterative reconstruction technique. COMPARISON:  MRI 05/21/2022, CT brain 10/30/2013 FINDINGS: Brain: No acute territorial infarction, hemorrhage or intracranial mass. The ventricles are  non enlarged. Vascular: No hyperdense vessel or unexpected calcification. Skull: Normal. Negative for fracture or focal lesion. Sinuses/Orbits: No acute finding. Other: None IMPRESSION: Negative non contrasted CT appearance of the brain. Electronically Signed   By: Donavan Foil M.D.   On: 06/21/2022 20:38    Procedures Procedures    Medications Ordered in ED Medications  butalbital-acetaminophen-caffeine (FIORICET) 50-325-40 MG per tablet 1 tablet (has no administration in time range)    ED Course/ Medical Decision Making/ A&P                           Medical Decision Making Risk Prescription drug management.   Patient had a CT and MRI done prior to my evaluation which were unremarkable.  No evidence of stroke.  I suspect her headaches are more tension type headaches with the bandlike distribution but she cannot really take anti-inflammatories secondary to ulcer she has had in the past.  Her neurologist was going to suggest an medicine but she stated that she did not really want any so they never prescribed it and on review of the neurology notes it does not say what she was thinking about.  I offered a migraine cocktail but patient does not really want a wait around much longer so wants to take a pill instead so we will give her Fioricet for now and a short prescription for the same and she will follow-up with her neurologist for further management of her headaches.  CT head and MRI head done and showed no obvious infarcts or mass lesions (independently viewed and interpreted by myself and radiology read reviewed).   Final Clinical Impression(s) / ED Diagnoses Final diagnoses:  Paresthesia  Nonintractable headache, unspecified chronicity pattern, unspecified headache type    Rx / DC Orders ED Discharge Orders          Ordered    butalbital-acetaminophen-caffeine (FIORICET) 50-325-40 MG tablet  Every 6 hours PRN        06/22/22 0250              Cleven Jansma, Corene Cornea,  MD 06/22/22 2130

## 2022-06-24 ENCOUNTER — Telehealth: Payer: Self-pay

## 2022-06-24 NOTE — Telephone Encounter (Signed)
        Patient  visited [facility] on 11/29 Telephone encounter attempt :  1sy  A HIPAA compliant voice message was left requesting a return call.  Instructed patient to call back     [place], [facility]  [phone] [address], [place], [place] [address] Phone: [phone] Email: [email or web address]

## 2022-06-24 NOTE — Telephone Encounter (Signed)
     Patient  visit on 11/29  at [facility]  Have you been able to follow up with your primary care physician? Yes   The patient was or was not able to obtain any needed medicine or equipment. Yes   Are there diet recommendations that you are having difficulty following? Na   Patient expresses understanding of discharge instructions and education provided has no other needs at this time. Yes      [place], [facility], [facility]  [phone] [address], [place], [place] [address] Phone: [phone] Email: [email or web address]

## 2022-06-30 DIAGNOSIS — E785 Hyperlipidemia, unspecified: Secondary | ICD-10-CM | POA: Diagnosis not present

## 2022-06-30 DIAGNOSIS — G250 Essential tremor: Secondary | ICD-10-CM | POA: Diagnosis not present

## 2022-06-30 DIAGNOSIS — G44201 Tension-type headache, unspecified, intractable: Secondary | ICD-10-CM | POA: Diagnosis not present

## 2022-06-30 DIAGNOSIS — I10 Essential (primary) hypertension: Secondary | ICD-10-CM | POA: Diagnosis not present

## 2022-07-12 DIAGNOSIS — Z1231 Encounter for screening mammogram for malignant neoplasm of breast: Secondary | ICD-10-CM | POA: Diagnosis not present

## 2022-07-28 DIAGNOSIS — R922 Inconclusive mammogram: Secondary | ICD-10-CM | POA: Diagnosis not present

## 2022-08-11 NOTE — Telephone Encounter (Signed)
Patient called and r/s her SS due to she is sick and not feeling well.  She is r/s for 10/10/22 at 9 pm.  Mailed new packet.  The [employer] will expire before the study I will redo the auth once the time gets closer.

## 2022-08-11 NOTE — Telephone Encounter (Signed)
Updated [employer] auth:  [employer]- [employer] Josem Kaufmann: [id number] (exp. 10/10/22 to 01/08/23)   Patient is scheduled at [facility] for 10/10/22 at 9 pm.

## 2022-08-22 DIAGNOSIS — Z0389 Encounter for observation for other suspected diseases and conditions ruled out: Secondary | ICD-10-CM | POA: Diagnosis not present

## 2022-08-22 DIAGNOSIS — N958 Other specified menopausal and perimenopausal disorders: Secondary | ICD-10-CM | POA: Diagnosis not present

## 2022-09-20 DIAGNOSIS — M130 Polyarthritis, unspecified: Secondary | ICD-10-CM | POA: Diagnosis not present

## 2022-09-20 DIAGNOSIS — E785 Hyperlipidemia, unspecified: Secondary | ICD-10-CM | POA: Diagnosis not present

## 2022-09-20 DIAGNOSIS — I10 Essential (primary) hypertension: Secondary | ICD-10-CM | POA: Diagnosis not present

## 2022-09-20 DIAGNOSIS — H60312 Diffuse otitis externa, left ear: Secondary | ICD-10-CM | POA: Diagnosis not present

## 2022-09-22 ENCOUNTER — Encounter: Payer: Self-pay | Admitting: Radiology

## 2022-10-05 DIAGNOSIS — H60312 Diffuse otitis externa, left ear: Secondary | ICD-10-CM | POA: Diagnosis not present

## 2022-10-05 NOTE — Progress Notes (Unsigned)
PATIENT: Nicole Meadows DOB: 1940/12/26  REASON FOR VISIT: follow up HISTORY FROM: patient PRIMARY NEUROLOGIST: Dr. Rexene Alberts  Chief Complaint  Patient presents with   Follow-up    Pt in 5 Pt here for tremors and headache f/u Pt states tremors are same Pt states tremors increase when she is fatigue Pt states headaches are better Pt states taking  sleep study 10/10/2022      HISTORY OF PRESENT ILLNESS: Today 10/05/22  Nicole Meadows is a 82 y.o. female who has been followed in this office for headaches tremor. Returns today for follow-up.   Reports that headaches are better. Has not had a severe headache in over a month. Headaches continue to be located in the occipital region. Went to ED in Nov for headache. No photophobia or phonophobia. Usually can take tylenol and that helps. Reports only twice a month. No pain down the neck or into the arms. Has sleep study scheduled next week 10/10/22.  Tremor is about the same. Unless she is very fatigued she is not aware of the tremor. Other people may notice but she does not. No trouble writing and feeding herself.   HISTORY Nicole Meadows is a 82 year old right-handed woman with an underlying medical history of anemia, arthritis, coronary artery disease, hyperlipidemia, glaucoma, hypertension, tremors, and obesity, who reports an approximately 5-week history of recurrent headaches.  Headaches are occipital, happen about twice a week, are more dull and achy, sometimes sharp.  She has no associated nausea or vomiting, no associated photophobia.  Headaches are not debilitating, she does typically take Tylenol, 1 pill, sometimes a second pill.  She used to have migraines, stopped having them when she turned 52 or thereabouts.  Her migraines were very severe, debilitating, she had associated light sensitivity and nausea.  Her headaches currently are nothing like her migraines.  She denies any sudden onset of one-sided weakness or numbness or tingling or  droopy face or slurring of speech.   Epworth sleepiness score is 8 out of 24, fatigue severity score is 17 out of 63.  She did not start any new medications recently.  She does not drink caffeine on a day-to-day basis.  She goes to bed between 11 and midnight and rise time is somewhere between 6 and 7, rarely 8.  She denies night to night nocturia.  She is not aware of any family history of sleep apnea.  Weight has been fluctuating a little bit.   She tries to hydrate well with water.  She has had recent stress, her daughter-in-law had to have surgery yesterday.  She did not get enough sleep because she was in the hospital supporting her son.  She does report not always sleeping well and having some difficulty going to sleep.  She does not take anything to help her sleep.  She snores some per husband.  Her husband has a sleep apnea machine.  She takes allergy medication seasonal, typically in the spring and fall.  She had her eye examination in January and had an updated set of eyeglasses.  She denies any blurry vision or double vision.  She does have residual neck pain and she has seen orthopedics for this.   I have evaluated her and followed her for her essential tremor for the past 5 years or so.  I last saw her on 09/30/21, at which time she reported a fairly stable tremor.  She had noticed flareup of her tremor when she was nervous.  She had  a prescription for gabapentin from her orthopedic surgeon as well as a prescription for muscle relaxer for shoulder pain but she was very cautious with her medications.  She had received a cortisone injection and had been in physical therapy for neck pain.  I reviewed your office note from 04/12/2022.  She reports no longer taking her Robaxin or gabapentin.  She took these medicines for about 2 to 3 weeks.  She did have some side effects including grogginess and drugged feeling the next day.   She had blood work through your office on 04/12/2022 and I reviewed the  results.  Lipid panel showed total cholesterol of 162, HDL 68, triglycerides 107, LDL 75.  CMP showed glucose of 99, BUN 15, creatinine 1.97, calcium mildly elevated at 11.6.  Alk phos 67, AST 24, ALT 17, CBC with differential and platelets benign.  She reports that she has had an elevated calcium level and has seen endocrinology.  They have just continue to monitor.  She does not take any calcium supplement any longer.  She was advised to stop her calcium supplementation when she saw endocrinology.   She had a remote brain MRI with and without contrast, as well as MR angiogram of the head without contrast on 08/22/2003 for indication of headaches. I reviewed the results:  IMPRESSION:  Normal MRI of the brain.      Normal intracranial MRA.    The patient's allergies, current medications, family history, past medical history, past social history, past surgical history and problem list were reviewed and updated as appropriate.   REVIEW OF SYSTEMS: Out of a complete 14 system review of symptoms, the patient complains only of the following symptoms, and all other reviewed systems are negative.  ALLERGIES: Allergies  Allergen Reactions   Hydrocodone Nausea And Vomiting   Molds & Smuts     Other reaction(s): Other (See Comments)   Zocor [Simvastatin]     myalgias   Latex Rash   Penicillins Rash    Has patient had a PCN reaction causing immediate rash, facial/tongue/throat swelling, SOB or lightheadedness with hypotension: Unknown Has patient had a PCN reaction causing severe rash involving mucus membranes or skin necrosis: Yes Has patient had a PCN reaction that required hospitalization: No Has patient had a PCN reaction occurring within the last 10 years: No If all of the above answers are "NO", then may proceed with Cephalosporin use.    HOME MEDICATIONS: Outpatient Medications Prior to Visit  Medication Sig Dispense Refill   azelastine (ASTELIN) 137 MCG/SPRAY nasal spray Place 1 spray into  the nose at bedtime. Use in each nostril as directed     butalbital-acetaminophen-caffeine (FIORICET) 50-325-40 MG tablet Take 1-2 tablets by mouth every 6 (six) hours as needed for headache. 20 tablet 0   cholecalciferol (VITAMIN D) 1000 units tablet Take 2,000 Units by mouth daily.     Cinnamon 500 MG capsule Take 500 mg by mouth daily.     cycloSPORINE (RESTASIS) 0.05 % ophthalmic emulsion Place 1 drop into both eyes 2 (two) times daily.      desonide (DESOWEN) 0.05 % cream Apply topically as needed.     diclofenac Sodium (VOLTAREN) 1 % GEL Apply topically 4 (four) times daily.     doxycycline (VIBRAMYCIN) 100 MG capsule Take 100 mg by mouth 2 (two) times daily.     FLAX OIL-FISH OIL-BORAGE OIL PO Take 1 capsule by mouth daily.      gabapentin (NEURONTIN) 100 MG capsule Take 1 capsule (  100 mg total) by mouth at bedtime. As needed for tingling 90 capsule 2   guaiFENesin (MUCINEX) 600 MG 12 hr tablet Take 1 tablet (600 mg total) by mouth 2 (two) times daily as needed. 30 tablet 0   nitroGLYCERIN (NITROLINGUAL) 0.4 MG/SPRAY spray Place 1 spray under the tongue every 5 (five) minutes x 3 doses as needed for chest pain. (Patient taking differently: Place 1 spray under the tongue as needed for chest pain.) 12 g 2   nystatin cream (MYCOSTATIN) Apply topically 2 (two) times daily.     pantoprazole (PROTONIX) 40 MG tablet Take 40 mg by mouth daily.     rosuvastatin (CRESTOR) 5 MG tablet Take 5 mg by mouth every evening.      sucralfate (CARAFATE) 1 g tablet Take 1 tablet (1 g total) by mouth 4 (four) times daily -  with meals and at bedtime. 28 tablet 0   telmisartan-hydrochlorothiazide (MICARDIS HCT) 80-12.5 MG per tablet Take 1 tablet by mouth daily. 28 tablet 0   No facility-administered medications prior to visit.    PAST MEDICAL HISTORY: Past Medical History:  Diagnosis Date   Anemia    Arthritis    Coronary artery disease    moderate, by cath   Dyslipidemia    Fibroid    Glaucoma  09/25/2017   patient denies having this or being diagnosed with this ever   History of nuclear stress test 05/2010   bruce myoview; small fixed distal anteroapical, apical defect (breast attenuation or apical thinning), no reversible ischemia, post-stress EF 83%, abnormal but low risk study    Hyperlipidemia    Hypertension    Tremor    Vaginal dryness     PAST SURGICAL HISTORY: Past Surgical History:  Procedure Laterality Date   ABDOMINAL HYSTERECTOMY  1995   BREAST SURGERY     Biopsy negative   CARDIAC CATHETERIZATION  05/31/2007   no signficant CAD, low normal EF (Dr. Gerrie Nordmann)    CATARACT EXTRACTION W/PHACO Right 03/02/2015   Procedure: CATARACT EXTRACTION PHACO AND INTRAOCULAR LENS PLACEMENT RIGHT EYE CDE=5.78;  Surgeon: [name], MD;  Location: [facility];  Service: Ophthalmology;  Laterality: Right;   CATARACT EXTRACTION W/PHACO Left 03/19/2015   Procedure: CATARACT EXTRACTION PHACO AND INTRAOCULAR LENS PLACEMENT LEFT EYE CDE=5.44;  Surgeon: [name], MD;  Location: [facility];  Service: Ophthalmology;  Laterality: Left;   TRANSTHORACIC ECHOCARDIOGRAM  2005   EF 60% with borderline conc LVH; mild TR, RVSP 72mmHg    TUBAL LIGATION  1977    FAMILY HISTORY: Family History  Problem Relation Age of Onset   Hypertension Mother    Sudden death Mother    Heart disease Father    Kidney failure Father    Cancer Sister        uterine   Heart failure Brother        DM, HTN   Aneurysm Brother        abdominal; also HTN   Hypertension Brother        sudden death   Hypertension Brother    Hyperlipidemia Brother        also DM   Hypertension Brother    Other Brother        2 brain surgeries, bleeding   Migraines Neg Hx    Headache Neg Hx     SOCIAL HISTORY: Social History   Socioeconomic History   Marital status: Married    Spouse name: Not on file   Number of children:  Not on file   Years of education: Not on file   Highest education level: Not on file  Occupational  History   Not on file  Tobacco Use   Smoking status: Never   Smokeless tobacco: Never  Vaping Use   Vaping Use: Never used  Substance and Sexual Activity   Alcohol use: No   Drug use: No   Sexual activity: Not on file    Comment: Hysterectomy  Other Topics Concern   Not on file  Social History Narrative   Not on file   Social Determinants of Health   Financial Resource Strain: Not on file  Food Insecurity: Not on file  Transportation Needs: Not on file  Physical Activity: Not on file  Stress: Not on file  Social Connections: Not on file  Intimate Partner Violence: Not on file      PHYSICAL EXAM  Vitals:   10/06/22 1028  BP: (!) 115/53  Pulse: 75  Weight: 180 lb (81.6 kg)  Height: 5\' 5"  (1.651 m)   Body mass index is 29.95 kg/m.  Generalized: Well developed, in no acute distress   Neurological examination  Mentation: Alert oriented to time, place, history taking. Follows all commands speech and language fluent Cranial nerve II-XII: Pupils were equal round reactive to light. Extraocular movements were full, visual field were full on confrontational test. Facial sensation and strength were normal. Head turning and shoulder shrug  were normal and symmetric. Motor: The motor testing reveals 5 over 5 strength of all 4 extremities. Good symmetric motor tone is noted throughout.  Sensory: Sensory testing is intact to soft touch on all 4 extremities. No evidence of extinction is noted.  Coordination: Cerebellar testing reveals good finger-nose-finger and heel-to-shin bilaterally.  Gait and station: Gait is normal.  Reflexes: Deep tendon reflexes are symmetric and normal bilaterally.   DIAGNOSTIC DATA (LABS, IMAGING, TESTING) - I reviewed patient records, labs, notes, testing and imaging myself where available.  Lab Results  Component Value Date   WBC 5.8 06/21/2022   HGB 14.6 06/21/2022   HCT 43.0 06/21/2022   MCV 95.7 06/21/2022   PLT 204 06/21/2022       Component Value Date/Time   NA 139 06/21/2022 1813   K 3.6 06/21/2022 1813   CL 108 06/21/2022 1813   CO2 21 (L) 06/21/2022 1752   GLUCOSE 126 (H) 06/21/2022 1813   BUN 18 06/21/2022 1813   CREATININE 1.00 06/21/2022 1813   CALCIUM 10.5 (H) 06/21/2022 1752   PROT 7.1 06/21/2022 1752   ALBUMIN 4.0 06/21/2022 1752   AST 31 06/21/2022 1752   ALT 25 06/21/2022 1752   ALKPHOS 69 06/21/2022 1752   BILITOT 0.7 06/21/2022 1752   GFRNONAA 58 (L) 06/21/2022 1752   GFRAA >60 01/28/2018 0807   Lab Results  Component Value Date   CHOL 160 09/11/2017   HDL 64 09/11/2017   LDLCALC 73 09/11/2017   TRIG 115 09/11/2017   CHOLHDL 2.5 09/11/2017     ASSESSMENT AND PLAN 82 y.o. year old female  has a past medical history of Anemia, Arthritis, Coronary artery disease, Dyslipidemia, Fibroid, Glaucoma (09/25/2017), History of nuclear stress test (05/2010), Hyperlipidemia, Hypertension, Tremor, and Vaginal dryness. here with:  1.  Tension type headaches  -Currently stable -Has a sleep study scheduled for 3/18  2.  Tremor  -Stable -Continue to monitor  Follow-up after sleep study  Ward Givens, MSN, NP-C 10/05/2022, 4:39 PM [facility] [address], [place], [place]  [address] [phone]   

## 2022-10-06 ENCOUNTER — Encounter: Payer: Self-pay | Admitting: Adult Health

## 2022-10-06 ENCOUNTER — Ambulatory Visit: Payer: Medicare PPO | Admitting: Adult Health

## 2022-10-06 VITALS — BP 115/53 | HR 75 | Ht 65.0 in | Wt 180.0 lb

## 2022-10-06 DIAGNOSIS — G44219 Episodic tension-type headache, not intractable: Secondary | ICD-10-CM | POA: Diagnosis not present

## 2022-10-06 DIAGNOSIS — R251 Tremor, unspecified: Secondary | ICD-10-CM

## 2022-10-10 ENCOUNTER — Ambulatory Visit (INDEPENDENT_AMBULATORY_CARE_PROVIDER_SITE_OTHER): Payer: Medicare PPO | Admitting: Neurology

## 2022-10-10 DIAGNOSIS — E669 Obesity, unspecified: Secondary | ICD-10-CM

## 2022-10-10 DIAGNOSIS — R519 Headache, unspecified: Secondary | ICD-10-CM

## 2022-10-10 DIAGNOSIS — Z8669 Personal history of other diseases of the nervous system and sense organs: Secondary | ICD-10-CM

## 2022-10-10 DIAGNOSIS — G250 Essential tremor: Secondary | ICD-10-CM

## 2022-10-10 DIAGNOSIS — R0683 Snoring: Secondary | ICD-10-CM

## 2022-10-10 DIAGNOSIS — G4720 Circadian rhythm sleep disorder, unspecified type: Secondary | ICD-10-CM

## 2022-10-18 NOTE — Procedures (Signed)
Physician Interpretation:     [facility] POLYSOMNOGRAPHY  INTERPRETATION REPORT   STUDY DATE:  10/10/2022     PATIENT NAME:  Nicole Meadows         DATE OF BIRTH:  1940/07/28  PATIENT ID:  [id number]    TYPE OF STUDY:  PSG  READING PHYSICIAN: Star Age, MD, PhD   SCORING TECHNICIAN: Richard Miu, RPSGT   Referred by: Lucianne Lei, MD   History and Indication for Testing: 82 year old right-handed woman with an underlying medical history of anemia, arthritis, coronary artery disease, hyperlipidemia, glaucoma, hypertension, tremors, and obesity, who reports recurrent headaches and daytime tiredness.  Height: 60 in Weight: 179 lb (BMI 34) Neck Size: 15 in   MEDICATIONS: Astelin, Vitamin D, Cinnamon, Restasis, Desowen, Voltaren Gel, Vibramycin, Flax Oil- Fish Oil-Borage Oil, Nitrolingual, Mycostatin, Protonix, Crestor, Micardis HCT, Neurontin (not currently taking), Mucinex, Robaxin (not currently taking), Carafate  TECHNICAL DESCRIPTION: A registered sleep technologist was in attendance for the duration of the recording.  Data collection, scoring, video monitoring, and reporting were performed in compliance with the AASM Manual for the Scoring of Sleep and Associated Events; (Hypopnea is scored based on the criteria listed in Section VIII D. 1b in the AASM Manual V2.6 using a 4% oxygen desaturation rule or Hypopnea is scored based on the criteria listed in Section VIII D. 1a in the AASM Manual V2.6 using 3% oxygen desaturation and /or arousal rule).   SLEEP CONTINUITY AND SLEEP ARCHITECTURE:  Lights-out was at 21:53: and lights-on at  05:27:, with a total recording time of 7 hours, 34 minutes. Total sleep time ( TST) was 349.5 minutes with a decreased sleep efficiency at 77.0%. There was  8.2% REM sleep.   BODY POSITION:  TST was divided  between the following sleep positions: 0.9% supine;  99.1% lateral;  0% prone. Duration of total sleep and percent of  total sleep in their respective position is as follows: supine 03 minutes (1%), non-supine 347 minutes (99%); right 273 minutes (78%), left 73 minutes (21%), and prone 00 minutes (0%).  Total supine REM sleep time was 00 minutes (0% of total REM sleep).  Sleep latency was increased at 30.0 minutes.  REM sleep latency was increased at 202.5 minutes. Of the total sleep time, the percentage of stage N1 sleep was 7.0%, stage N2 sleep was 64%, which is increased, stage N3 sleep was 20.6%, which is normal, and REM sleep was 8.2%, which is reduced. Wake after sleep onset (WASO) time accounted for 74.5 minutes with mild to moderate sleep fragmentation noted.   RESPIRATORY MONITORING:  Based on CMS criteria (using a 4% oxygen desaturation rule for scoring hypopneas), there were 0 apneas (0 obstructive; 0 central; 0 mixed), and 6 hypopneas.  Apnea index was 0.0. Hypopnea index was 1.0. The apnea-hypopnea index was 1.0/hour overall (20.0 supine, 0 non-supine; 0.0 REM, 0.0 supine REM).  There were 0 respiratory effort-related arousals (RERAs).  The RERA index was 0 events/h. Total respiratory disturbance index (RDI) was 1.0 events/h. RDI results showed: supine RDI  20.0 /h; non-supine RDI 0.9 /h; REM RDI 0.0 /h, supine REM RDI 0.0 /h.   Based on AASM criteria (using a 3% oxygen desaturation and /or arousal rule for scoring hypopneas), there were 0 apneas (0 obstructive; 0 central; 0 mixed), and 6 hypopneas. Apnea index was 0.0. Hypopnea index was 1.0. The apnea-hypopnea index was 1.0 overall (20.0 supine, 0 non-supine; 0.0 REM, 0.0 supine REM).  There were 0 respiratory  effort-related arousals (RERAs).  The RERA index was 0 events/h. Total respiratory disturbance index (RDI) was 1.0 events/h. RDI results showed: supine RDI  20.0 /h; non-supine RDI 0.9 /h; REM RDI 0.0 /h, supine REM RDI 0.0 /h.   OXIMETRY: Oxyhemoglobin Saturation Nadir during sleep was at  87% from a mean of 96%.  Of the Total sleep time (TST)    hypoxemia (=<88%) was present for  0.0 minutes, or 0.0% of total sleep time.   LIMB MOVEMENTS: There were 0 periodic limb movements of sleep (0.0/hr), of which 0 (0.0/hr) were associated with an arousal.  AROUSAL: There were 38 arousals in total, for an arousal index of 7 arousals/hour.  Of these, 6 were identified as respiratory-related arousals (1 /h), 0 were PLM-related arousals (0 /h), and 47 were non-specific arousals (8 /h).  EEG: Review of the EEG showed no abnormal electrical discharges and symmetrical bihemispheric findings.    EKG: The EKG revealed normal sinus rhythm (NSR). The average heart rate during sleep was 57 bpm.   AUDIO/VIDEO REVIEW: The audio and video review did not show any abnormal or unusual behaviors, movements, phonations or vocalizations. The patient took no bathroom breaks. Snoring was noted intermittently, in the mild to moderate range.  POST-STUDY QUESTIONNAIRE: Post study, the patient indicated, that sleep was better than usual. In her comments, she was very complimentary of the sleep technologist.  IMPRESSION:   1. Primary Snoring 2. Dysfunctions associated with sleep stages or arousal from sleep  RECOMMENDATIONS:   1. This study does not demonstrate any significant obstructive or central sleep disordered breathing with an AHI of less than 5/hour -  Her AHI was 1.0/hour - and oxygen desaturation nadir was 87%. Mild to moderate intermittent snoring was noted. Treatment with a positive airway pressure device, such as CPAP or autoPAP is not indicated. Weight loss and avoidance of the supine sleep position may aid in reducing her snoring.  2. This study shows sleep fragmentation and abnormal sleep stage percentages; these are nonspecific findings and per se do not signify an intrinsic sleep disorder or a cause for the patient's sleep-related symptoms. Causes include (but are not limited to) the first night effect of the sleep study, circadian rhythm disturbances,  medication effect or an underlying mood disorder or medical problem.  3. The patient should be cautioned not to drive, work at heights, or operate dangerous or heavy equipment when tired or sleepy. Review and reiteration of good sleep hygiene measures should be pursued with any patient. 4. The patient will be advised to follow up with the referring provider, who will be notified of the test results.   I certify that I have reviewed the entire raw data recording prior to the issuance of this report in accordance with the Standards of Accreditation of the American Academy of Sleep Medicine (AASM). Star Age, MD, PhD Medical Director, [facility]) [place], [place] (Neurology and Sleep)              Technical Report:   General Information  Name: Amzie, Marck BMI: 34.96 Physician: Star Age, MD  ID: [id number] Height: 60.0 in Technician: Richard Miu, RPSGT  Sex: Female Weight: 179.0 lb Record: [id number]  Age: 62 [1941/01/11] Date: 10/10/2022    Medical & Medication History    82 year old right-handed woman with an underlying medical history of anemia, arthritis, coronary artery disease, hyperlipidemia, glaucoma, hypertension, tremors, and obesity, who reports an approximately 5-week history of recurrent headaches. Headaches are occipital, happen  about twice a week, are more dull and achy, sometimes sharp. She has no associated nausea or vomiting, no associated photophobia. Headaches are not debilitating, she does typically take Tylenol, 1 pill, sometimes a second pill. She used to have migraines, stopped having them when she turned 52 or thereabouts. Her migraines were very severe, debilitating, she had associated light sensitivity and nausea. Her headaches currently are nothing like her migraines. She denies any sudden onset of one-sided weakness or numbness or tingling or droopy face or slurring of speech. Astelin, Vitamin D, Cinnamon,  Restasis, Desowen, Voltaren Gel, Vibramycin, Flax Oil- Fish Oil-Borage Oil, Nitrolingual, Mycostatin, Protonix, Crestor, Micardis HCT, Neurontin, Mucinex, Robaxin, Carafate   Sleep Disorder      Comments   The patient came into the lab for a PSG. Per the patient she is not currently taking gabapentin or Robaxin. The patient had no restroom breaks. EKG kept in NSR. Mild to moderate snoring. All sleep stages witnessed. Respiratory events scored with a 4% desat. Slept mostly lateral. The patient did have some spontaneous arousals. Some leg movements noted. The patients AHI was 0.5 after 2 hrs of TST.     Lights out: 09:53:07 PM Lights on: 05:27:02 AM   Time Total Supine Side Prone Upright  Recording (TRT) 7h 34.52m 0h 14.51m 7h 19.44m 0h 0.15m 0h 0.67m  Sleep (TST) 5h 49.42m 0h 3.72m 5h 46.22m 0h 0.32m 0h 0.37m   Latency N1 N2 N3 REM Onset Per. Slp. Eff.  Actual 0h 0.35m 0h 1.4m 0h 41.38m 3h 22.42m 0h 30.62m 0h 43.18m 76.98%   Stg Dur Wake N1 N2 N3 REM  Total 104.5 24.5 224.5 72.0 28.5  Supine 11.5 2.0 1.0 0.0 0.0  Side 93.0 22.5 223.5 72.0 28.5  Prone 0.0 0.0 0.0 0.0 0.0  Upright 0.0 0.0 0.0 0.0 0.0   Stg % Wake N1 N2 N3 REM  Total 23.0 7.0 64.2 20.6 8.2  Supine 2.5 0.6 0.3 0.0 0.0  Side 20.5 6.4 63.9 20.6 8.2  Prone 0.0 0.0 0.0 0.0 0.0  Upright 0.0 0.0 0.0 0.0 0.0     Apnea Summary Sub Supine Side Prone Upright  Total 0 Total 0 0 0 0 0    REM 0 0 0 0 0    NREM 0 0 0 0 0  Obs 0 REM 0 0 0 0 0    NREM 0 0 0 0 0  Mix 0 REM 0 0 0 0 0    NREM 0 0 0 0 0  Cen 0 REM 0 0 0 0 0    NREM 0 0 0 0 0   Rera Summary Sub Supine Side Prone Upright  Total 0 Total 0 0 0 0 0    REM 0 0 0 0 0    NREM 0 0 0 0 0   Hypopnea Summary Sub Supine Side Prone Upright  Total 6 Total 6 1 5  0 0    REM 0 0 0 0 0    NREM 6 1 5  0 0   4% Hypopnea Summary Sub Supine Side Prone Upright  Total (4%) 6 Total 6 1 5  0 0    REM 0 0 0 0 0    NREM 6 1 5  0 0     AHI Total Obs Mix Cen  1.03 Apnea 0.00 0.00 0.00 0.00    Hypopnea 1.03 -- -- --  1.03 Hypopnea (4%) 1.03 -- -- --    Total Supine Side Prone Upright  Position  AHI 1.03 20.00 0.87 0.00 0.00  REM AHI 0.00   NREM AHI 1.12   Position RDI 1.03 20.00 0.87 0.00 0.00  REM RDI 0.00   NREM RDI 1.12    4% Hypopnea Total Supine Side Prone Upright  Position AHI (4%) 1.03 20.00 0.87 0.00 0.00  REM AHI (4%) 0.00   NREM AHI (4%) 1.12   Position RDI (4%) 1.03 20.00 0.87 0.00 0.00  REM RDI (4%) 0.00   NREM RDI (4%) 1.12    Desaturation Information Threshold: 2% <100% <90% <80% <70% <60% <50% <40%  Supine 5.0 0.0 0.0 0.0 0.0 0.0 0.0  Side 46.0 3.0 0.0 0.0 0.0 0.0 0.0  Prone 0.0 0.0 0.0 0.0 0.0 0.0 0.0  Upright 0.0 0.0 0.0 0.0 0.0 0.0 0.0  Total 51.0 3.0 0.0 0.0 0.0 0.0 0.0  Index 7.7 0.5 0.0 0.0 0.0 0.0 0.0   Threshold: 3% <100% <90% <80% <70% <60% <50% <40%  Supine 4.0 0.0 0.0 0.0 0.0 0.0 0.0  Side 14.0 3.0 0.0 0.0 0.0 0.0 0.0  Prone 0.0 0.0 0.0 0.0 0.0 0.0 0.0  Upright 0.0 0.0 0.0 0.0 0.0 0.0 0.0  Total 18.0 3.0 0.0 0.0 0.0 0.0 0.0  Index 2.7 0.5 0.0 0.0 0.0 0.0 0.0   Threshold: 4% <100% <90% <80% <70% <60% <50% <40%  Supine 2.0 0.0 0.0 0.0 0.0 0.0 0.0  Side 14.0 3.0 0.0 0.0 0.0 0.0 0.0  Prone 0.0 0.0 0.0 0.0 0.0 0.0 0.0  Upright 0.0 0.0 0.0 0.0 0.0 0.0 0.0  Total 16.0 3.0 0.0 0.0 0.0 0.0 0.0  Index 2.4 0.5 0.0 0.0 0.0 0.0 0.0   Threshold: 3% <100% <90% <80% <70% <60% <50% <40%  Supine 4 0 0 0 0 0 0  Side 14 3 0 0 0 0 0  Prone 0 0 0 0 0 0 0  Upright 0 0 0 0 0 0 0  Total 18 3 0 0 0 0 0   Awakening/Arousal Information # of Awakenings 29  Wake after sleep onset 74.44m  Wake after persistent sleep 70.56m   Arousal Assoc. Arousals Index  Apneas 0 0.0  Hypopneas 6 1.0  Leg Movements 0 0.0  Snore 0 0.0  PTT Arousals 0 0.0  Spontaneous 47 8.1  Total 53 9.1  Leg Movement Information PLMS LMs Index  Total LMs during PLMS 0 0.0  LMs w/ Microarousals 0 0.0   LM LMs Index  w/ Microarousal 0 0.0  w/ Awakening 0 0.0  w/ Resp Event 0  0.0  Spontaneous 11 1.9  Total 11 1.9     Desaturation threshold setting: 3% Minimum desaturation setting: 10 seconds SaO2 nadir: 79% The longest event was a 43 sec obstructive Hypopnea with a minimum SaO2 of 94%. The lowest SaO2 was 86% associated with a 25 sec obstructive Hypopnea. EKG Rates EKG Avg Max Min  Awake 60 98 49  Asleep 57 75 49  EKG Events: N/A

## 2022-10-20 ENCOUNTER — Telehealth: Payer: Self-pay | Admitting: *Deleted

## 2022-10-20 NOTE — Telephone Encounter (Signed)
Spoke with the patient and discussed her sleep study results as noted below by Dr. Rexene Alberts.  The patient verbalized understanding that her sleep study did not show any significant obstructive sleep apnea.  Mild to moderate intermittent snoring was noted and some weight loss and avoiding sleeping on her back may aid in reducing the snoring.  Patient aware that treatment with a CPAP machine is not indicated.  She was pleased to hear this.  We scheduled her follow-up for October 02, 2023 at 11 AM arrival 10:30 AM.  Patient verbalized appreciation for the call.

## 2022-10-20 NOTE — Telephone Encounter (Signed)
-----   Message from Star Age, MD sent at 10/18/2022 11:48 AM EDT ----- Patient had a PSG on 10/10/22.  I had seen her in 03/2022 for new onset HA and she saw MM for tremor FU in March 2024. Please call and notify the patient that the recent sleep study did not show any significant obstructive sleep apnea. Mild to moderate intermittent snoring was noted. Some weight loss and avoiding sleeping on the back may aid in reducing her snoring. Treatment with a CPAP machine is not indicated.  She can FU routinely with Hedwig Morton. in one year. Please arrange follow up appt as none is scheduled.    Thanks,  Star Age, MD, PhD [facility])

## 2022-11-03 DIAGNOSIS — M130 Polyarthritis, unspecified: Secondary | ICD-10-CM | POA: Diagnosis not present

## 2022-11-03 DIAGNOSIS — H6502 Acute serous otitis media, left ear: Secondary | ICD-10-CM | POA: Diagnosis not present

## 2022-11-03 DIAGNOSIS — Z20822 Contact with and (suspected) exposure to covid-19: Secondary | ICD-10-CM | POA: Diagnosis not present

## 2022-11-03 DIAGNOSIS — E7800 Pure hypercholesterolemia, unspecified: Secondary | ICD-10-CM | POA: Diagnosis not present

## 2022-11-03 DIAGNOSIS — R251 Tremor, unspecified: Secondary | ICD-10-CM | POA: Diagnosis not present

## 2022-11-03 DIAGNOSIS — E782 Mixed hyperlipidemia: Secondary | ICD-10-CM | POA: Diagnosis not present

## 2022-11-03 DIAGNOSIS — I10 Essential (primary) hypertension: Secondary | ICD-10-CM | POA: Diagnosis not present

## 2023-01-17 DIAGNOSIS — G4489 Other headache syndrome: Secondary | ICD-10-CM | POA: Diagnosis not present

## 2023-01-17 DIAGNOSIS — M130 Polyarthritis, unspecified: Secondary | ICD-10-CM | POA: Diagnosis not present

## 2023-01-17 DIAGNOSIS — E1169 Type 2 diabetes mellitus with other specified complication: Secondary | ICD-10-CM | POA: Diagnosis not present

## 2023-01-17 DIAGNOSIS — I10 Essential (primary) hypertension: Secondary | ICD-10-CM | POA: Diagnosis not present

## 2023-01-17 DIAGNOSIS — R251 Tremor, unspecified: Secondary | ICD-10-CM | POA: Diagnosis not present

## 2023-01-24 DIAGNOSIS — I2510 Atherosclerotic heart disease of native coronary artery without angina pectoris: Secondary | ICD-10-CM | POA: Diagnosis not present

## 2023-01-24 DIAGNOSIS — G250 Essential tremor: Secondary | ICD-10-CM | POA: Diagnosis not present

## 2023-01-24 DIAGNOSIS — K219 Gastro-esophageal reflux disease without esophagitis: Secondary | ICD-10-CM | POA: Diagnosis not present

## 2023-01-24 DIAGNOSIS — E785 Hyperlipidemia, unspecified: Secondary | ICD-10-CM | POA: Diagnosis not present

## 2023-01-24 DIAGNOSIS — R739 Hyperglycemia, unspecified: Secondary | ICD-10-CM | POA: Diagnosis not present

## 2023-01-24 DIAGNOSIS — N182 Chronic kidney disease, stage 2 (mild): Secondary | ICD-10-CM | POA: Diagnosis not present

## 2023-01-24 DIAGNOSIS — M1990 Unspecified osteoarthritis, unspecified site: Secondary | ICD-10-CM | POA: Diagnosis not present

## 2023-01-24 DIAGNOSIS — E669 Obesity, unspecified: Secondary | ICD-10-CM | POA: Diagnosis not present

## 2023-01-24 DIAGNOSIS — J301 Allergic rhinitis due to pollen: Secondary | ICD-10-CM | POA: Diagnosis not present

## 2023-02-09 ENCOUNTER — Ambulatory Visit
Admission: EM | Admit: 2023-02-09 | Discharge: 2023-02-09 | Disposition: A | Discharge status: Home / Self Care | Payer: Medicare PPO | Attending: Family Medicine | Admitting: Family Medicine

## 2023-02-09 DIAGNOSIS — Z1152 Encounter for screening for COVID-19: Secondary | ICD-10-CM | POA: Insufficient documentation

## 2023-02-09 DIAGNOSIS — R059 Cough, unspecified: Secondary | ICD-10-CM | POA: Diagnosis not present

## 2023-02-09 DIAGNOSIS — J069 Acute upper respiratory infection, unspecified: Secondary | ICD-10-CM | POA: Diagnosis not present

## 2023-02-09 MED ORDER — GUAIFENESIN-CODEINE 100-10 MG/5ML PO SOLN: 10.0000 mL | Freq: Three times a day (TID) | ORAL | 0 refills | Status: DC | PRN

## 2023-02-09 MED ORDER — GUAIFENESIN-CODEINE 100-10 MG/5ML PO SOLN: 10.0000 mL | Freq: Three times a day (TID) | ORAL | 0 refills | Status: AC | PRN

## 2023-02-09 MED ORDER — PAXLOVID (300/100) 20 X 150 MG & 10 X 100MG PO TBPK: 3.0000 | ORAL_TABLET | Freq: Two times a day (BID) | ORAL | 0 refills | Status: AC

## 2023-02-09 NOTE — ED Triage Notes (Signed)
Cough, congestion, fatigue that started 6 days ago. Took a home Covid test Monday and it was negative. Taking OTC flu and cold medication, and cough syrup with little relief.

## 2023-02-09 NOTE — ED Provider Notes (Signed)
[facility]    CSN: [id number] Arrival date & time: 02/09/23  1103      History   Chief Complaint Chief Complaint  Patient presents with   Cough   Nasal Congestion    HPI Nicole Meadows is a 82 y.o. female.   Patient presenting today with almost a week of cough, congestion, fatigue, chills, low-grade fever.  Denies chest pain, shortness of breath, abdominal pain, nausea vomiting or diarrhea.  Took a home COVID test 2 days after onset which was negative.  Taking over-the-counter cold congestion medication and some old Robitussin AC that she had at home with mild temporary benefit.  No known history of chronic pulmonary disease.    Past Medical History:  Diagnosis Date   Anemia    Arthritis    Coronary artery disease    moderate, by cath   Dyslipidemia    Fibroid    Glaucoma 09/25/2017   patient denies having this or being diagnosed with this ever   History of nuclear stress test 05/2010   bruce myoview; small fixed distal anteroapical, apical defect (breast attenuation or apical thinning), no reversible ischemia, post-stress EF 83%, abnormal but low risk study    Hyperlipidemia    Hypertension    Tremor    Vaginal dryness     Patient Active Problem List   Diagnosis Date Noted   Hypocalciuric hypercalcemia 06/10/2021   Osteopenia 06/10/2021   Gastroesophageal reflux disease without esophagitis 09/15/2020   Seasonal allergic rhinitis 09/15/2020   Sore throat 09/15/2020   Bilateral carotid artery disease (HCC) 09/13/2018   Vaginitis and vulvovaginitis 09/07/2018   Arthritis 09/07/2018   Coronary artery calcification 08/29/2016   Dyspnea 08/29/2016   Mixed hyperlipidemia 08/29/2016   Coronary artery disease involving native coronary artery of native heart with angina pectoris (HCC) 08/29/2016   Obesity (BMI 30-39.9) 08/25/2015   Essential hypertension 07/08/2013   Dyslipidemia 07/08/2013   Tremor     Past Surgical History:  Procedure Laterality  Date   ABDOMINAL HYSTERECTOMY  1995   BREAST SURGERY     Biopsy negative   CARDIAC CATHETERIZATION  05/31/2007   no signficant CAD, low normal EF (Dr. Laurell Josephs)    CATARACT EXTRACTION W/PHACO Right 03/02/2015   Procedure: CATARACT EXTRACTION PHACO AND INTRAOCULAR LENS PLACEMENT RIGHT EYE CDE=5.78;  Surgeon: Gemma Payor, MD;  Location: [facility];  Service: Ophthalmology;  Laterality: Right;   CATARACT EXTRACTION W/PHACO Left 03/19/2015   Procedure: CATARACT EXTRACTION PHACO AND INTRAOCULAR LENS PLACEMENT LEFT EYE CDE=5.44;  Surgeon: Gemma Payor, MD;  Location: [facility];  Service: Ophthalmology;  Laterality: Left;   TRANSTHORACIC ECHOCARDIOGRAM  2005   EF 60% with borderline conc LVH; mild TR, RVSP    TUBAL LIGATION  1977    OB History     Gravida  2   Para  2   Term      Preterm      AB      Living         SAB      IAB      Ectopic      Multiple      Live Births               Home Medications    Prior to Admission medications   Medication Sig Start Date End Date Taking? Authorizing Provider  azelastine (ASTELIN) 137 MCG/SPRAY nasal spray Place 1 spray into the nose at bedtime. Use in each nostril as directed  Yes [provider]  butalbital-acetaminophen-caffeine (FIORICET) 50-325-40 MG tablet Take 1-2 tablets by mouth every 6 (six) hours as needed for headache. 06/22/22 06/22/23 Yes Mesner, Barbara Cower, MD  cholecalciferol (VITAMIN D) 1000 units tablet Take 2,000 Units by mouth daily.   Yes [provider]  Cinnamon 500 MG capsule Take 500 mg by mouth daily.   Yes [provider]  cycloSPORINE (RESTASIS) 0.05 % ophthalmic emulsion Place 1 drop into both eyes 2 (two) times daily.    Yes [provider]  diclofenac Sodium (VOLTAREN) 1 % GEL Apply topically 4 (four) times daily.   Yes [provider]  FLAX OIL-FISH OIL-BORAGE OIL PO Take 1 capsule by mouth daily.    Yes [provider]  nirmatrelvir & ritonavir  (PAXLOVID, 300/100,) 20 x 150 MG & 10 x 100MG  TBPK Take 3 tablets by mouth 2 (two) times daily for 5 days. 02/09/23 02/14/23 Yes Particia Nearing, PA-C  nystatin cream (MYCOSTATIN) Apply topically 2 (two) times daily.   Yes [provider]  pantoprazole (PROTONIX) 40 MG tablet Take 40 mg by mouth daily. 11/28/21  Yes [provider]  rosuvastatin (CRESTOR) 5 MG tablet Take 5 mg by mouth every evening.    Yes [provider]  telmisartan-hydrochlorothiazide (MICARDIS HCT) 80-12.5 MG per tablet Take 1 tablet by mouth daily. 07/08/13  Yes Hilty, Lisette Abu, MD  desonide (DESOWEN) 0.05 % cream Apply topically as needed.    [provider]  doxycycline (VIBRAMYCIN) 100 MG capsule Take 100 mg by mouth 2 (two) times daily. 12/22/21   [provider]  gabapentin (NEURONTIN) 100 MG capsule Take 1 capsule (100 mg total) by mouth at bedtime. As needed for tingling 03/31/21   Vickki Hearing, MD  guaiFENesin (MUCINEX) 600 MG 12 hr tablet Take 1 tablet (600 mg total) by mouth 2 (two) times daily as needed. 08/17/21   Particia Nearing, PA-C  guaiFENesin-codeine 100-10 MG/5ML syrup Take 10 mLs by mouth 3 (three) times daily as needed for cough. 02/09/23   Particia Nearing, PA-C  nitroGLYCERIN (NITROLINGUAL) 0.4 MG/SPRAY spray Place 1 spray under the tongue every 5 (five) minutes x 3 doses as needed for chest pain. Patient taking differently: Place 1 spray under the tongue as needed for chest pain. 08/25/15   Hilty, Lisette Abu, MD  sucralfate (CARAFATE) 1 g tablet Take 1 tablet (1 g total) by mouth 4 (four) times daily -  with meals and at bedtime. 01/28/18   Linwood Dibbles, MD    Family History Family History  Problem Relation Age of Onset   Hypertension Mother    Sudden death Mother    Heart disease Father    Kidney failure Father    Cancer Sister        uterine   Heart failure Brother        DM, HTN   Aneurysm Brother        abdominal; also HTN    Hypertension Brother        sudden death   Hypertension Brother    Hyperlipidemia Brother        also DM   Hypertension Brother    Other Brother        2 brain surgeries, bleeding   Migraines Neg Hx    Headache Neg Hx     Social History Social History   Tobacco Use   Smoking status: Never   Smokeless tobacco: Never  Vaping Use   Vaping status: Never  Used  Substance Use Topics   Alcohol use: No   Drug use: No     Allergies   Hydrocodone, Molds & smuts, Zocor [simvastatin], Latex, and Penicillins   Review of Systems Review of Systems Per HPI  Physical Exam Triage Vital Signs ED Triage Vitals  Encounter Vitals Group     BP 02/09/23 1146 122/66     Systolic BP Percentile --      Diastolic BP Percentile --      Pulse Rate 02/09/23 1146 93     Resp 02/09/23 1146 16     Temp 02/09/23 1146 98.6 F (37 C)     Temp Source 02/09/23 1146 Oral     SpO2 02/09/23 1146 96 %     Weight --      Height --      Head Circumference --      Peak Flow --      Pain Score 02/09/23 1148 0     Pain Loc --      Pain Education --      Exclude from Growth Chart --    No data found.  Updated Vital Signs BP 122/66 (BP Location: Right Arm)   Pulse 93   Temp 98.6 F (37 C) (Oral)   Resp 16   SpO2 96%   Visual Acuity Right Eye Distance:   Left Eye Distance:   Bilateral Distance:    Right Eye Near:   Left Eye Near:    Bilateral Near:     Physical Exam Vitals and nursing note reviewed.  Constitutional:      Appearance: Normal appearance.  HENT:     Head: Atraumatic.     Right Ear: Tympanic membrane and external ear normal.     Left Ear: Tympanic membrane and external ear normal.     Nose: Rhinorrhea present.     Mouth/Throat:     Mouth: Mucous membranes are moist.     Pharynx: Posterior oropharyngeal erythema present.  Eyes:     Extraocular Movements: Extraocular movements intact.     Conjunctiva/sclera: Conjunctivae normal.  Cardiovascular:     Rate and Rhythm:  Normal rate and regular rhythm.     Heart sounds: Normal heart sounds.  Pulmonary:     Effort: Pulmonary effort is normal.     Breath sounds: Normal breath sounds. No wheezing.  Musculoskeletal:        General: Normal range of motion.     Cervical back: Normal range of motion and neck supple.  Skin:    General: Skin is warm and dry.  Neurological:     Mental Status: She is alert and oriented to person, place, and time.  Psychiatric:        Mood and Affect: Mood normal.        Thought Content: Thought content normal.      UC Treatments / Results  Labs (all labs ordered are listed, but only abnormal results are displayed) Labs Reviewed  SARS CORONAVIRUS 2 (TAT 6-24 HRS)    EKG   Radiology No results found.  Procedures Procedures (including critical care time)  Medications Ordered in UC Medications - No data to display  Initial Impression / Assessment and Plan / UC Course  I have reviewed the triage vital signs and the nursing notes.  Pertinent labs & imaging results that were available during my care of the patient were reviewed by me and considered in my medical decision making (see chart for details).  Signs and exam reassuring and suggestive of a viral upper respiratory infection.  COVID testing pending, though this is my suspicion at this time.  Treat with Paxlovid, Robitussin AC, supportive over-the-counter medications and home care and await results.  Return for worsening symptoms.  Final Clinical Impressions(s) / UC Diagnoses   Final diagnoses:  Viral URI with cough   Discharge Instructions   None    ED Prescriptions     Medication Sig Dispense Auth. Provider   guaiFENesin-codeine 100-10 MG/5ML syrup  (Status: Discontinued) Take 10 mLs by mouth 3 (three) times daily as needed for cough. 120 mL Particia Nearing, PA-C   nirmatrelvir & ritonavir (PAXLOVID, 300/100,) 20 x 150 MG & 10 x 100MG  TBPK Take 3 tablets by mouth 2 (two) times daily for 5  days. 30 tablet Particia Nearing, [place]   guaiFENesin-codeine 100-10 MG/5ML syrup Take 10 mLs by mouth 3 (three) times daily as needed for cough. 120 mL Particia Nearing, [place]      I have reviewed the PDMP during this encounter.   Particia Nearing, [place] 02/09/23 1304

## 2023-02-10 LAB — SARS CORONAVIRUS 2 (TAT 6-24 HRS): SARS Coronavirus 2: NEGATIVE

## 2023-02-13 ENCOUNTER — Other Ambulatory Visit: Payer: Self-pay | Admitting: Family Medicine

## 2023-02-13 ENCOUNTER — Ambulatory Visit
Admission: RE | Admit: 2023-02-13 | Discharge: 2023-02-13 | Disposition: A | Discharge status: Home / Self Care | Payer: Medicare PPO | Source: Ambulatory Visit | Attending: Family Medicine | Admitting: Family Medicine

## 2023-02-13 DIAGNOSIS — E1169 Type 2 diabetes mellitus with other specified complication: Secondary | ICD-10-CM | POA: Diagnosis not present

## 2023-02-13 DIAGNOSIS — Z6830 Body mass index (BMI) 30.0-30.9, adult: Secondary | ICD-10-CM | POA: Diagnosis not present

## 2023-02-13 DIAGNOSIS — J470 Bronchiectasis with acute lower respiratory infection: Secondary | ICD-10-CM | POA: Diagnosis not present

## 2023-02-13 DIAGNOSIS — I10 Essential (primary) hypertension: Secondary | ICD-10-CM | POA: Diagnosis not present

## 2023-02-13 DIAGNOSIS — R059 Cough, unspecified: Secondary | ICD-10-CM | POA: Diagnosis not present

## 2023-02-23 DIAGNOSIS — I10 Essential (primary) hypertension: Secondary | ICD-10-CM | POA: Diagnosis not present

## 2023-02-23 DIAGNOSIS — E089 Diabetes mellitus due to underlying condition without complications: Secondary | ICD-10-CM | POA: Diagnosis not present

## 2023-05-16 DIAGNOSIS — E1169 Type 2 diabetes mellitus with other specified complication: Secondary | ICD-10-CM | POA: Diagnosis not present

## 2023-05-16 DIAGNOSIS — E781 Pure hyperglyceridemia: Secondary | ICD-10-CM | POA: Diagnosis not present

## 2023-05-16 DIAGNOSIS — I10 Essential (primary) hypertension: Secondary | ICD-10-CM | POA: Diagnosis not present

## 2023-05-16 DIAGNOSIS — R635 Abnormal weight gain: Secondary | ICD-10-CM | POA: Diagnosis not present

## 2023-07-20 DIAGNOSIS — Z1231 Encounter for screening mammogram for malignant neoplasm of breast: Secondary | ICD-10-CM | POA: Diagnosis not present

## 2023-08-03 ENCOUNTER — Ambulatory Visit: Payer: Medicare PPO | Admitting: Internal Medicine

## 2023-08-25 ENCOUNTER — Ambulatory Visit: Payer: Medicare PPO | Attending: Internal Medicine | Admitting: Internal Medicine

## 2023-08-25 ENCOUNTER — Encounter: Payer: Self-pay | Admitting: Internal Medicine

## 2023-08-25 VITALS — BP 118/70 | HR 69 | Ht 65.5 in | Wt 181.4 lb

## 2023-08-25 DIAGNOSIS — R9431 Abnormal electrocardiogram [ECG] [EKG]: Secondary | ICD-10-CM | POA: Diagnosis not present

## 2023-08-25 DIAGNOSIS — E785 Hyperlipidemia, unspecified: Secondary | ICD-10-CM | POA: Diagnosis not present

## 2023-08-25 DIAGNOSIS — I10 Essential (primary) hypertension: Secondary | ICD-10-CM | POA: Diagnosis not present

## 2023-08-25 DIAGNOSIS — R072 Precordial pain: Secondary | ICD-10-CM

## 2023-08-25 DIAGNOSIS — I2510 Atherosclerotic heart disease of native coronary artery without angina pectoris: Secondary | ICD-10-CM

## 2023-08-25 NOTE — Progress Notes (Signed)
OFFICE NOTE  Chief Complaint:  Chest pain  Primary Care Physician: Renaye Rakers, MD  HPI:  Nicole Meadows  is a pleasant 83 year old female with history of moderate coronary disease by cath on medical therapy, also problems with up-and-down weight and essential tremor. She has dyslipidemia which has been fairly well controlled and hypertension which is also at goal. Her only other concern is that really she's had some left breast pain. The symptom are somewhat toothache-like, and seemed to been improved with topical corticosteroids. She saw dermatologist who prescribed this medicine. She denies any worsening shortness of breath with exertion but has had several pound weight gain and knows that she needs to work on this. She's been taking care of her husband who recently had knee replacement.  I saw Nicole Meadows back today for follow-up. She denies any chest pain or worsening shortness of breath. She's had good blood pressure control. Her weight seems pretty stable. She has a essential tremor which is unchanged.  Nicole Meadows returns today for follow-up. Over the past year she's had no new complaints. She did have an episode in the summer of upper mid back pain. He was thought to be orthopedic and she went to the hospital but ruled out and underwent testing which was negative. She's had no further episodes. Blood pressure is well-controlled. Her cholesterol is at goal per primary care provider.  08/29/2016  [name] reports a recent episode of chest pressure. She felt tightness and burning across her chest and between her shoulder blades. She was hot. She thought it was reflux however she's been on Protonix for a long time. An EKG was performed her primary care doctor's office which showed no ischemia and she said a "heart" blood test was done, I suspect a troponin which was reportedly negative. She did have a history of moderate coronary artery disease by cardiac catheterization in the late  2000's and had a negative nuclear stress test in November 2011. She's not had any coronary risk assessment since then. In 2016 she had CT angiography of the chest which demonstrated coronary artery calcification.  09/06/2017  Nicole Meadows returns today for follow-up.  Overall she seems to be doing well.  She has been working with rehabilitation and Driscilla Grammes in order to get stronger.  She denies any chest pain or new reflux symptoms.  EKG shows normal sinus rhythm today.  Her blood pressure is well controlled.  09/13/2018  Nicole Meadows is seen today in follow-up.  Over the past year she is done well.  She has managed to lose about 5 pounds.  She denies any chest pain or shortness of breath.  She tells me that she did have some [employer] screening tests performed a year ago which showed moderate left carotid artery stenosis.  She has not had carotid artery testing here however she was noted to have prior moderate coronary artery disease by cath in the past.  We manage this medically.  She is followed in neurology for tremor and has no history of stroke or TIA.  Blood pressures well controlled today.  Her last lipid profile was in December 2019 which showed total cholesterol 142, HDL 68, LDL 57 triglyceride 89.  09/25/2019  Nicole Meadows returns today for follow-up.  Overall she is doing well.  She saw her neurologist this morning and reports stable essential tremor.  She denies any chest pain or shortness of breath.  Blood pressures well controlled.  Cholesterols been at goal.  She  has had both COVID-19 vaccines.  She had mild carotid artery disease in the left last year.  This was noted initially by [employer] screening to be moderate however seemed less significant by our test.  I advised to repeat.  10/08/2020  Nicole Meadows is seen today for follow-up.  She still has issues with some pain in her left neck that radiates down the arm.  She saw the neurologist about this and she felt it could be related to  tremor however she is going to get another opinion from an orthopedist.  I suspect it might be related to her cervical spine.  Blood pressure was low today which she says is unusual for her.  In general it runs in the low 100s systolic.  EKG shows a normal sinus rhythm.  She had labs little less than a year ago which showed total cholesterol 146, HDL 65, triglycerides 93 and LDL 63.  This is followed routinely by Dr. Parke Simmers.  01/17/2022  Nicole Meadows returns today for follow-up.  Overall she is doing well.  In early June she was in the emergency department [facility] with chest pain.  She had minor surgery to her finger and was placed on doxycycline.  Apparently this caused her reaction and discomfort in her abdomen that radiated up into her chest.  Work-up in the ER was negative with no EKG changes and negative troponins.  She was advised by her PCP to stop the doxycycline after 5-day course and her symptoms resolved.  Since then she has had no further symptoms.  She does get some occasional shortness of breath but has stopped her routine of walking.  Blood pressure is well controlled today.  She had repeat lipids in May which showed total cholesterol 161, HDL 70, LDL 73 and triglycerides 92.  ALT was mildly elevated at 26 on low-dose rosuvastatin.  08/25/2023  Nicole Meadows is seen today in follow-up.  She reports in the fall that she had several episodes of chest pain.  1 occurred when raking her lawn and lasted on and off for about a week.  It was a little worse with exertion but somewhat sharp in quality.  She has had some other episodes with discomfort in her chest as well.  She recently had a mammogram which showed some calcification of the breast.  In looking back she has had CT scans before which showed coronary artery calcifications.  Her last stress test was in 2018 which was negative for ischemia.  She does have some mild known vascular disease as well as some minimal carotid atherosclerosis.  She  is on appropriate medical therapy including rosuvastatin 5 mg daily with a recent total cholesterol 164, HDL 70, triglycerides 141 and LDL 72.  Blood pressure is at target at 118/70 on telmisartan HCTZ.  PMHx:  Past Medical History:  Diagnosis Date   Anemia    Arthritis    Coronary artery disease    moderate, by cath   Dyslipidemia    Fibroid    Glaucoma 09/25/2017   patient denies having this or being diagnosed with this ever   History of nuclear stress test 05/2010   bruce myoview; small fixed distal anteroapical, apical defect (breast attenuation or apical thinning), no reversible ischemia, post-stress EF 83%, abnormal but low risk study    Hyperlipidemia    Hypertension    Tremor    Vaginal dryness     Past Surgical History:  Procedure Laterality Date   ABDOMINAL HYSTERECTOMY  1995  BREAST SURGERY     Biopsy negative   CARDIAC CATHETERIZATION  05/31/2007   no signficant CAD, low normal EF (Dr. Laurell Josephs)    CATARACT EXTRACTION W/PHACO Right 03/02/2015   Procedure: CATARACT EXTRACTION PHACO AND INTRAOCULAR LENS PLACEMENT RIGHT EYE CDE=5.78;  Surgeon: Gemma Payor, MD;  Location: [facility];  Service: Ophthalmology;  Laterality: Right;   CATARACT EXTRACTION W/PHACO Left 03/19/2015   Procedure: CATARACT EXTRACTION PHACO AND INTRAOCULAR LENS PLACEMENT LEFT EYE CDE=5.44;  Surgeon: Gemma Payor, MD;  Location: [facility];  Service: Ophthalmology;  Laterality: Left;   TRANSTHORACIC ECHOCARDIOGRAM  2005   EF 60% with borderline conc LVH; mild TR, RVSP    TUBAL LIGATION  1977    FAMHx:  Family History  Problem Relation Age of Onset   Hypertension Mother    Sudden death Mother    Heart disease Father    Kidney failure Father    Cancer Sister        uterine   Heart failure Brother        DM, HTN   Aneurysm Brother        abdominal; also HTN   Hypertension Brother        sudden death   Hypertension Brother    Hyperlipidemia Brother        also DM   Hypertension Brother     Other Brother        2 brain surgeries, bleeding   Migraines Neg Hx    Headache Neg Hx     SOCHx:   reports that she has never smoked. She has never used smokeless tobacco. She reports that she does not drink alcohol and does not use drugs.  ALLERGIES:  Allergies  Allergen Reactions   Hydrocodone Nausea And Vomiting   Molds & Smuts     Other reaction(s): Other (See Comments)   Zocor [Simvastatin]     myalgias   Latex Rash   Penicillins Rash    Has patient had a PCN reaction causing immediate rash, facial/tongue/throat swelling, SOB or lightheadedness with hypotension: Unknown Has patient had a PCN reaction causing severe rash involving mucus membranes or skin necrosis: Yes Has patient had a PCN reaction that required hospitalization: No Has patient had a PCN reaction occurring within the last 10 years: No If all of the above answers are "NO", then may proceed with Cephalosporin use.    ROS: Pertinent items noted in HPI and remainder of comprehensive ROS otherwise negative.  HOME MEDS: Current Outpatient Medications  Medication Sig Dispense Refill   azelastine (ASTELIN) 137 MCG/SPRAY nasal spray Place 1 spray into the nose at bedtime. Use in each nostril as directed     cholecalciferol (VITAMIN D) 1000 units tablet Take 2,000 Units by mouth daily.     Cinnamon 500 MG capsule Take 500 mg by mouth daily.     cycloSPORINE (RESTASIS) 0.05 % ophthalmic emulsion Place 1 drop into both eyes 2 (two) times daily.      desonide (DESOWEN) 0.05 % cream Apply topically as needed.     diclofenac Sodium (VOLTAREN) 1 % GEL Apply topically 4 (four) times daily.     FLAX OIL-FISH OIL-BORAGE OIL PO Take 1 capsule by mouth daily.      guaiFENesin-codeine 100-10 MG/5ML syrup Take 10 mLs by mouth 3 (three) times daily as needed for cough. 120 mL 0   nitroGLYCERIN (NITROLINGUAL) 0.4 MG/SPRAY spray Place 1 spray under the tongue every 5 (five) minutes x 3 doses as needed  for chest pain. (Patient  taking differently: Place 1 spray under the tongue as needed for chest pain.) 12 g 2   nystatin cream (MYCOSTATIN) Apply topically 2 (two) times daily.     pantoprazole (PROTONIX) 40 MG tablet Take 40 mg by mouth daily.     rosuvastatin (CRESTOR) 5 MG tablet Take 5 mg by mouth every evening.      telmisartan-hydrochlorothiazide (MICARDIS HCT) 80-12.5 MG per tablet Take 1 tablet by mouth daily. 28 tablet 0   No current facility-administered medications for this visit.    LABS/IMAGING: No results found for this or any previous visit (from the past 48 hours). No results found.  VITALS: BP 118/70   Pulse 69   Ht 5' 5.5" (1.664 m)   Wt 181 lb 6.4 oz (82.3 kg)   SpO2 97%   BMI 29.73 kg/m   EXAM: General appearance: alert and no distress Neck: no carotid bruit and no JVD Lungs: clear to auscultation bilaterally Heart: regular rate and rhythm, S1, S2 normal, no murmur, click, rub or gallop Abdomen: soft, non-tender; bowel sounds normal; no masses,  no organomegaly Extremities: extremities normal, atraumatic, no cyanosis or edema Pulses: 2+ and symmetric Skin: Skin color, texture, turgor normal. No rashes or lesions Neurologic: Grossly normal psych: Mood, affect normal  EKG: EKG Interpretation Date/Time:  Friday August 25 2023 11:01:13 EST Ventricular Rate:  62 PR Interval:  170 QRS Duration:  110 QT Interval:  410 QTC Calculation: 416 R Axis:   -33  Text Interpretation: Normal sinus rhythm Left axis deviation Moderate voltage criteria for LVH, may be normal variant ( R in aVL , Cornell product ) Possible Anterior infarct , age undetermined When compared with ECG of 21-Jun-2022 18:03, Borderline criteria for Anterior infarct are now Present Confirmed by Zoila Shutter [phone]) on 08/25/2023 11:08:12 AM    ASSESSMENT: Chest pain, possible unstable angina Abnormal EKG  poor R wave progression anteriorly Hypertension-controlled Dyslipidemia-at goal Essential  tremor Obesity Moderate left carotid artery stenosis by [employer] screening - mild by our study  PLAN: 1.   Mrs. Char has had some recent chest discomfort with exertion.  EKG today shows minimal changes, however, there is poor R wave progression anteriorly.  She has some known coronary artery calcifications (I personally reviewed a CT chest dated 03/25/2015) and recently was noted to have some calcification of the arteries of the breast on mammogram.  She seems to be well treated as far as her LDL nearly at target of less than 70.  Blood pressure is at goal.  I am concerned that her symptoms could be angina.  Since her last stress test was almost 8 years ago, I like to repeat a Lexiscan Myoview.  In addition because of her EKG changes as well, I'm concerned about possible LAD territory ischemia.  Will follow-up on this study.  She has also backed off some on her exercise.  Have encouraged her to restart that especially after her stress test results have returned if they are low risk.  Plan follow-up in 6 months or sooner if necessary and of course if her study is abnormal we will see her back sooner.  TIME SPENT WITH PATIENT: 40 minutes of direct patient care. More than 50% of that time was spent on coordination of care and counseling regarding chest pain concerning for angina, reviewing EKG with patient, personally reviewing prior CT scans and imaging results, extensive chart review, patient exam, record reviews, and discussion of treatment plan and follow-up.  Chrystie Nose, MD, [facility], FACP  [facility]  Medical Director of the [facility] Lipid Disorders &  Cardiovascular Risk Reduction Clinic Diplomate of the American Board of Clinical Lipidology Attending Cardiologist  Direct Dial: [phone]  Fax: [phone]  Website:  [email or web address] Nicely Ardith Test 08/25/2023, 11:08 AM

## 2023-08-25 NOTE — Patient Instructions (Addendum)
Medication Instructions:  Your physician recommends that you continue on your current medications as directed. Please refer to the Current Medication list given to you today.  *If you need a refill on your cardiac medications before your next appointment, please call your pharmacy*   Lab Work: NONE ordered at this time of appointment   Testing/Procedures: Your physician has requested that you have a lexiscan myoview. For further information please visit [email or web address] Please follow instruction sheet, as given.   How to Prepare for Your Myoview Test (stress test):  1.Please do not take these medications before your test:  2.Your remaining medications may be taken with water. 3.Nothing to eat or drink, except water, 4 hours prior to arrival time.  NO caffeine/decaffeinated products, or chocolate 12 hours prior to arrival. 4.Ladies, please do not wear dresses.  Skirts or pants are approprate, please wear a short sleeve shirt. 5.NO perfume, cologne or lotion 6.Wear comfortable walking shoes.  NO HEELS! 7.Total time is 3 to 4 hours; you may want to bring reading material for the waiting time. 8.Please report to [facility] for your test  What to expect after you arrive:  Once you arrive and check in for your appointment an IV will be started in your arm.  Then the Technoligist will inject a small amount of radioactive tracer.  There will be a 1 hour waiting period after this injection.  A series of pictures will be taken of your heart following this waiting period.  You will be prepped for the stress portion of the test.  During the stress portion of your test you will either walk on a treadmill or receive a small, safe amount of radioactive tracer injected in your IV.  After the stress portion, there is a short rest period during which time your heart and blood pressure will be monitored.  After the short rest period the Technologist will begin your second set of pictures.  Your doctor  will inform you of your test results within 7-10 business days.  In preparation for your appointment, medication and supplies will be purchased.  Appointment availability is limited, so if you need to cancel or reschedule please call the office at [phone] 24 hours in advance to avoid a cancellation fee of $100.00   Follow-Up: At [facility], you and your health needs are our priority.  As part of our continuing mission to provide you with exceptional heart care, we have created designated Provider Care Teams.  These Care Teams include your primary Cardiologist (physician) and Advanced Practice Providers (APPs -  Physician Assistants and Nurse Practitioners) who all work together to provide you with the care you need, when you need it.  We recommend signing up for the patient portal called "MyChart".  Sign up information is provided on this After Visit Summary.  MyChart is used to connect with patients for Virtual Visits (Telemedicine).  Patients are able to view lab/test results, encounter notes, upcoming appointments, etc.  Non-urgent messages can be sent to your provider as well.   To learn more about what you can do with MyChart, go to [email or web address].    Your next appointment:   6 month(s)  Provider:   Chrystie Nose, MD or APP    Other Instructions

## 2023-08-29 ENCOUNTER — Telehealth (HOSPITAL_COMMUNITY): Payer: Self-pay | Admitting: *Deleted

## 2023-08-29 NOTE — Telephone Encounter (Signed)
 Patient given detailed instructions per Myocardial Perfusion Study Information Sheet for the test on 09/06/23 Patient notified to arrive 15 minutes early and that it is imperative to arrive on time for appointment to keep from having the test rescheduled.  If you need to cancel or reschedule your appointment, please call the office within 24 hours of your appointment. . Patient verbalized understanding. Claudene Ronal Quale, RN

## 2023-09-06 ENCOUNTER — Other Ambulatory Visit: Payer: Self-pay | Admitting: Internal Medicine

## 2023-09-06 ENCOUNTER — Ambulatory Visit (HOSPITAL_COMMUNITY): Payer: Medicare PPO | Attending: Internal Medicine

## 2023-09-06 VITALS — Ht 65.0 in | Wt 181.0 lb

## 2023-09-06 DIAGNOSIS — R9431 Abnormal electrocardiogram [ECG] [EKG]: Secondary | ICD-10-CM | POA: Insufficient documentation

## 2023-09-06 DIAGNOSIS — R072 Precordial pain: Secondary | ICD-10-CM | POA: Diagnosis not present

## 2023-09-06 DIAGNOSIS — I10 Essential (primary) hypertension: Secondary | ICD-10-CM | POA: Insufficient documentation

## 2023-09-06 LAB — MYOCARDIAL PERFUSION IMAGING
LV dias vol: 48 mL (ref 46–106)
LV sys vol: 12 mL
Nuc Stress EF: 75 %
Peak HR: 89 {beats}/min
Rest HR: 59 {beats}/min
Rest Nuclear Isotope Dose: 10.8 mCi
Rest Nuclear Isotope Dose: 10.1 mCi
SDS: 0
SRS: 0
SSS: 0
ST Depression (mm): 0 mm
Stress Nuclear Isotope Dose: 32.6 mCi
Stress Nuclear Isotope Dose: 31.7 mCi
TID: 0.86

## 2023-09-06 MED ORDER — TECHNETIUM TC 99M TETROFOSMIN IV KIT
10.8000 | PACK | Freq: Once | INTRAVENOUS | Status: AC | PRN
  Administered 2023-09-06: 10.8 via INTRAVENOUS

## 2023-09-06 MED ORDER — TECHNETIUM TC 99M TETROFOSMIN IV KIT
32.6000 | PACK | Freq: Once | INTRAVENOUS | Status: AC | PRN
  Administered 2023-09-06: 32.6 via INTRAVENOUS

## 2023-09-06 MED ORDER — REGADENOSON 0.4 MG/5ML IV SOLN
0.4000 mg | Freq: Once | INTRAVENOUS | Status: AC
  Administered 2023-09-06: 0.4 mg via INTRAVENOUS

## 2023-09-15 DIAGNOSIS — Z6829 Body mass index (BMI) 29.0-29.9, adult: Secondary | ICD-10-CM | POA: Diagnosis not present

## 2023-09-15 DIAGNOSIS — E782 Mixed hyperlipidemia: Secondary | ICD-10-CM | POA: Diagnosis not present

## 2023-09-15 DIAGNOSIS — I10 Essential (primary) hypertension: Secondary | ICD-10-CM | POA: Diagnosis not present

## 2023-09-15 DIAGNOSIS — E1169 Type 2 diabetes mellitus with other specified complication: Secondary | ICD-10-CM | POA: Diagnosis not present

## 2023-09-29 NOTE — Progress Notes (Signed)
 PATIENT: Nicole Meadows DOB: June 25, 1941  REASON FOR VISIT: follow up HISTORY FROM: patient PRIMARY NEUROLOGIST: Dr. Frances Furbish  Chief Complaint  Patient presents with   Follow-up    Patient in room #5 and alone. Patient states she still has headaches and she had about four last week, one today. Patient states she hasn't notices any tremors lately.     HISTORY OF PRESENT ILLNESS: Today 10/02/23  Nicole Meadows is a 83 y.o. female with a history of headaches and essential tremor. Returns today for follow-up.  Over the last week she reports that her headaches have restarted.  She states prior the past week headaches had become very infrequent.  She states that she has had a headache for the last 3 days.  Headache is located across the occipital region.  Describes the discomfort as a dull pain.  Denies light or noise sensitivity.  No nausea or vomiting.  She has tried taking Tylenol the last 2 nights.  Unable to take NSAIDs due to history of gastric ulcer.  She states approximately 2 years ago she was having left shoulder pain.  She saw Dr. Fuller [place].  Was given gabapentin and Robaxin.  She did not take the gabapentin consistently but she did feel that the Robaxin was helpful.  She states that she continues to have some discomfort in the shoulder that radiates up to the neck.  She states that she has a hard time getting comfortable at night when she is sleeping.  Not sure if this is contributing to the pain in the occipital region.  Reports that she did tolerate Robaxin well.  Denies any significant side effects.  CT head IMPRESSION: Negative non contrasted CT appearance of the brain.  MRI of the brain: IMPRESSION: 1. No acute intracranial abnormality. 2. Findings of chronic small vessel ischemia.  10/06/22: Nicole Meadows is a 83 y.o. female who has been followed in this office for headaches tremor. Returns today for follow-up.   Reports that headaches are better. Has not had a  severe headache in over a month. Headaches continue to be located in the occipital region. Went to ED in Nov for headache. No photophobia or phonophobia. Usually can take tylenol and that helps. Reports only twice a month. No pain down the neck or into the arms. Has sleep study scheduled next week 10/10/22.  Tremor is about the same. Unless she is very fatigued she is not aware of the tremor. Other people may notice but she does not. No trouble writing and feeding herself.   HISTORY Nicole Meadows is a 83 year old right-handed woman with an underlying medical history of anemia, arthritis, coronary artery disease, hyperlipidemia, glaucoma, hypertension, tremors, and obesity, who reports an approximately 5-week history of recurrent headaches.  Headaches are occipital, happen about twice a week, are more dull and achy, sometimes sharp.  She has no associated nausea or vomiting, no associated photophobia.  Headaches are not debilitating, she does typically take Tylenol, 1 pill, sometimes a second pill.  She used to have migraines, stopped having them when she turned 52 or thereabouts.  Her migraines were very severe, debilitating, she had associated light sensitivity and nausea.  Her headaches currently are nothing like her migraines.  She denies any sudden onset of one-sided weakness or numbness or tingling or droopy face or slurring of speech.   Epworth sleepiness score is 8 out of 24, fatigue severity score is 17 out of 63.  She did not start any new  medications recently.  She does not drink caffeine on a day-to-day basis.  She goes to bed between 11 and midnight and rise time is somewhere between 6 and 7, rarely 8.  She denies night to night nocturia.  She is not aware of any family history of sleep apnea.  Weight has been fluctuating a little bit.   She tries to hydrate well with water.  She has had recent stress, her daughter-in-law had to have surgery yesterday.  She did not get enough sleep because she was  in the hospital supporting her son.  She does report not always sleeping well and having some difficulty going to sleep.  She does not take anything to help her sleep.  She snores some per husband.  Her husband has a sleep apnea machine.  She takes allergy medication seasonal, typically in the spring and fall.  She had her eye examination in January and had an updated set of eyeglasses.  She denies any blurry vision or double vision.  She does have residual neck pain and she has seen orthopedics for this.   I have evaluated her and followed her for her essential tremor for the past 5 years or so.  I last saw her on 09/30/21, at which time she reported a fairly stable tremor.  She had noticed flareup of her tremor when she was nervous.  She had a prescription for gabapentin from her orthopedic surgeon as well as a prescription for muscle relaxer for shoulder pain but she was very cautious with her medications.  She had received a cortisone injection and had been in physical therapy for neck pain.  I reviewed your office note from 04/12/2022.  She reports no longer taking her Robaxin or gabapentin.  She took these medicines for about 2 to 3 weeks.  She did have some side effects including grogginess and drugged feeling the next day.   She had blood work through your office on 04/12/2022 and I reviewed the results.  Lipid panel showed total cholesterol of 162, HDL 68, triglycerides 107, LDL 75.  CMP showed glucose of 99, BUN 15, creatinine 1.97, calcium mildly elevated at 11.6.  Alk phos 67, AST 24, ALT 17, CBC with differential and platelets benign.  She reports that she has had an elevated calcium level and has seen endocrinology.  They have just continue to monitor.  She does not take any calcium supplement any longer.  She was advised to stop her calcium supplementation when she saw endocrinology.   She had a remote brain MRI with and without contrast, as well as MR angiogram of the head without contrast on  08/22/2003 for indication of headaches. I reviewed the results:  IMPRESSION:  Normal MRI of the brain.      Normal intracranial MRA.    The patient's allergies, current medications, family history, past medical history, past social history, past surgical history and problem list were reviewed and updated as appropriate.   REVIEW OF SYSTEMS: Out of a complete 14 system review of symptoms, the patient complains only of the following symptoms, and all other reviewed systems are negative.  ALLERGIES: Allergies  Allergen Reactions   Hydrocodone Nausea And Vomiting   Molds & Smuts     Other reaction(s): Other (See Comments)   Zocor [Simvastatin]     myalgias   Latex Rash   Penicillins Rash    Has patient had a PCN reaction causing immediate rash, facial/tongue/throat swelling, SOB or lightheadedness with hypotension: Unknown Has patient  had a PCN reaction causing severe rash involving mucus membranes or skin necrosis: Yes Has patient had a PCN reaction that required hospitalization: No Has patient had a PCN reaction occurring within the last 10 years: No If all of the above answers are "NO", then may proceed with Cephalosporin use.    HOME MEDICATIONS: Outpatient Medications Prior to Visit  Medication Sig Dispense Refill   azelastine (ASTELIN) 137 MCG/SPRAY nasal spray Place 1 spray into the nose at bedtime. Use in each nostril as directed     cholecalciferol (VITAMIN D) 1000 units tablet Take 2,000 Units by mouth daily.     Cinnamon 500 MG capsule Take 500 mg by mouth daily.     cycloSPORINE (RESTASIS) 0.05 % ophthalmic emulsion Place 1 drop into both eyes 2 (two) times daily.      desonide (DESOWEN) 0.05 % cream Apply topically as needed.     diclofenac Sodium (VOLTAREN) 1 % GEL Apply topically 4 (four) times daily.     FLAX OIL-FISH OIL-BORAGE OIL PO Take 1 capsule by mouth daily.      guaiFENesin-codeine 100-10 MG/5ML syrup Take 10 mLs by mouth 3 (three) times daily as needed for  cough. 120 mL 0   nitroGLYCERIN (NITROLINGUAL) 0.4 MG/SPRAY spray Place 1 spray under the tongue every 5 (five) minutes x 3 doses as needed for chest pain. (Patient taking differently: Place 1 spray under the tongue as needed for chest pain.) 12 g 2   nystatin cream (MYCOSTATIN) Apply topically 2 (two) times daily.     pantoprazole (PROTONIX) 40 MG tablet Take 40 mg by mouth daily.     rosuvastatin (CRESTOR) 5 MG tablet Take 5 mg by mouth every evening.      telmisartan-hydrochlorothiazide (MICARDIS HCT) 80-12.5 MG per tablet Take 1 tablet by mouth daily. 28 tablet 0   No facility-administered medications prior to visit.    PAST MEDICAL HISTORY: Past Medical History:  Diagnosis Date   Anemia    Arthritis    Coronary artery disease    moderate, by cath   Dyslipidemia    Fibroid    Glaucoma 09/25/2017   patient denies having this or being diagnosed with this ever   History of nuclear stress test 05/2010   bruce myoview; small fixed distal anteroapical, apical defect (breast attenuation or apical thinning), no reversible ischemia, post-stress EF 83%, abnormal but low risk study    Hyperlipidemia    Hypertension    Tremor    Vaginal dryness     PAST SURGICAL HISTORY: Past Surgical History:  Procedure Laterality Date   ABDOMINAL HYSTERECTOMY  1995   BREAST SURGERY     Biopsy negative   CARDIAC CATHETERIZATION  05/31/2007   no signficant CAD, low normal EF (Dr. Laurell Josephs)    CATARACT EXTRACTION W/PHACO Right 03/02/2015   Procedure: CATARACT EXTRACTION PHACO AND INTRAOCULAR LENS PLACEMENT RIGHT EYE CDE=5.78;  Surgeon: Gemma Payor, MD;  Location: [facility];  Service: Ophthalmology;  Laterality: Right;   CATARACT EXTRACTION W/PHACO Left 03/19/2015   Procedure: CATARACT EXTRACTION PHACO AND INTRAOCULAR LENS PLACEMENT LEFT EYE CDE=5.44;  Surgeon: Gemma Payor, MD;  Location: [facility];  Service: Ophthalmology;  Laterality: Left;   TRANSTHORACIC ECHOCARDIOGRAM  2005   EF 60% with borderline conc  LVH; mild TR, RVSP    TUBAL LIGATION  1977    FAMILY HISTORY: Family History  Problem Relation Age of Onset   Hypertension Mother    Sudden death Mother    Heart disease  Father    Kidney failure Father    Cancer Sister        uterine   Heart failure Brother        DM, HTN   Aneurysm Brother        abdominal; also HTN   Hypertension Brother        sudden death   Hypertension Brother    Hyperlipidemia Brother        also DM   Hypertension Brother    Other Brother        2 brain surgeries, bleeding   Migraines Neg Hx    Headache Neg Hx     SOCIAL HISTORY: Social History   Socioeconomic History   Marital status: Married    Spouse name: Not on file   Number of children: Not on file   Years of education: Not on file   Highest education level: Not on file  Occupational History   Not on file  Tobacco Use   Smoking status: Never   Smokeless tobacco: Never  Vaping Use   Vaping status: Never Used  Substance and Sexual Activity   Alcohol use: No   Drug use: No   Sexual activity: Not on file    Comment: Hysterectomy  Other Topics Concern   Not on file  Social History Narrative   Not on file   Social Drivers of Health   Financial Resource Strain: Not on file  Food Insecurity: Not on file  Transportation Needs: Not on file  Physical Activity: Not on file  Stress: Not on file  Social Connections: Not on file  Intimate Partner Violence: Not on file      PHYSICAL EXAM  Vitals:   10/02/23 1038  BP: (!) 107/57  Pulse: 66  Weight: 181 lb (82.1 kg)  Height: 5\' 5"  (1.651 m)    Body mass index is 30.12 kg/m.  Generalized: Well developed, in no acute distress   Neurological examination  Mentation: Alert oriented to time, place, history taking. Follows all commands speech and language fluent Cranial nerve II-XII: Pupils were equal round reactive to light. Extraocular movements were full, visual field were full on confrontational test. Facial sensation  and strength were normal. Head turning and shoulder shrug  were normal and symmetric. Motor: The motor testing reveals 5 over 5 strength of all 4 extremities. Good symmetric motor tone is noted throughout.  Sensory: Sensory testing is intact to soft touch on all 4 extremities. No evidence of extinction is noted.  Coordination: Cerebellar testing reveals good finger-nose-finger and heel-to-shin bilaterally.  Gait and station: Gait is normal.  Reflexes: Deep tendon reflexes are symmetric and normal bilaterally.   DIAGNOSTIC DATA (LABS, IMAGING, TESTING) - I reviewed patient records, labs, notes, testing and imaging myself where available.  Lab Results  Component Value Date   WBC 5.8 06/21/2022   HGB 14.6 06/21/2022   HCT 43.0 06/21/2022   MCV 95.7 06/21/2022   PLT 204 06/21/2022      Component Value Date/Time   NA 139 06/21/2022 1813   K 3.6 06/21/2022 1813   CL 108 06/21/2022 1813   CO2 21 (L) 06/21/2022 1752   GLUCOSE 126 (H) 06/21/2022 1813   BUN 18 06/21/2022 1813   CREATININE 1.00 06/21/2022 1813   CALCIUM 10.5 (H) 06/21/2022 1752   PROT 7.1 06/21/2022 1752   ALBUMIN 4.0 06/21/2022 1752   AST 31 06/21/2022 1752   ALT 25 06/21/2022 1752   ALKPHOS 69 06/21/2022 1752  BILITOT 0.7 06/21/2022 1752   GFRNONAA 58 (L) 06/21/2022 1752   GFRAA >60 01/28/2018 0807   Lab Results  Component Value Date   CHOL 160 09/11/2017   HDL 64 09/11/2017   LDLCALC 73 09/11/2017   TRIG 115 09/11/2017   CHOLHDL 2.5 09/11/2017     ASSESSMENT AND PLAN 83 y.o. year old female  has a past medical history of Anemia, Arthritis, Coronary artery disease, Dyslipidemia, Fibroid, Glaucoma (09/25/2017), History of nuclear stress test (05/2010), Hyperlipidemia, Hypertension, Tremor, and Vaginal dryness. here with:  1.  Tension type headaches  -Patient unable to take NSAIDs due to history of gastric ulcer -We discussed a muscle relaxer to see if that is helpful.  Since she has been on Robaxin before  and tolerated well we will try this muscle relaxer.  I reviewed potential side effects with the patient and provided information on her AVS. she verbalized understanding and is willing to proceed.  I will prescribe Robaxin 500 mg to take at bedtime as needed to see if this breaks her headache cycle.  This is not meant to be on long-term-will only give her 10 tablets.   - Also discussed with Dr. Lucia Gaskins and she agreed with robaxin.   2.  Tremor  -Stable -Continue to monitor  Follow-up in 1 year or sooner if needed  Butch Penny, MSN, NP-C 09/29/2023, 4:03 PM [facility] [address], [address] [place], [place] [address] [phone]

## 2023-10-02 ENCOUNTER — Ambulatory Visit: Payer: Medicare PPO | Admitting: Adult Health

## 2023-10-02 VITALS — BP 107/57 | HR 66 | Ht 65.0 in | Wt 181.0 lb

## 2023-10-02 DIAGNOSIS — G44219 Episodic tension-type headache, not intractable: Secondary | ICD-10-CM

## 2023-10-02 DIAGNOSIS — R251 Tremor, unspecified: Secondary | ICD-10-CM

## 2023-10-02 MED ORDER — METHOCARBAMOL 500 MG PO TABS: 500.0000 mg | ORAL_TABLET | Freq: Every evening | ORAL | 0 refills | Status: DC | PRN

## 2023-10-02 NOTE — Patient Instructions (Signed)
 Try Robaxin 500mg  at bedtime for the next week.  Review side effects before taking on attachment.  If your symptoms worsen or you develop new symptoms please let us know.

## 2023-10-03 ENCOUNTER — Telehealth: Payer: Self-pay | Admitting: Adult Health

## 2023-10-03 NOTE — Telephone Encounter (Signed)
 I called the patient.  She was able to get her prescription of Robaxin.  She states that she took it last night and slept really well.  She states that she has not had a headache at all today.  She states that her neck has been sore but this has been ongoing.

## 2023-10-12 ENCOUNTER — Other Ambulatory Visit: Payer: Self-pay

## 2023-10-12 ENCOUNTER — Emergency Department (HOSPITAL_COMMUNITY)
Admission: EM | Admit: 2023-10-12 | Discharge: 2023-10-12 | Disposition: A | Discharge status: Home / Self Care | Attending: Emergency Medicine | Admitting: Emergency Medicine

## 2023-10-12 ENCOUNTER — Emergency Department (HOSPITAL_COMMUNITY)

## 2023-10-12 ENCOUNTER — Encounter (HOSPITAL_COMMUNITY): Payer: Self-pay | Admitting: Emergency Medicine

## 2023-10-12 DIAGNOSIS — R109 Unspecified abdominal pain: Secondary | ICD-10-CM

## 2023-10-12 DIAGNOSIS — K449 Diaphragmatic hernia without obstruction or gangrene: Secondary | ICD-10-CM | POA: Diagnosis not present

## 2023-10-12 DIAGNOSIS — R1012 Left upper quadrant pain: Secondary | ICD-10-CM | POA: Diagnosis not present

## 2023-10-12 DIAGNOSIS — Z79899 Other long term (current) drug therapy: Secondary | ICD-10-CM | POA: Diagnosis not present

## 2023-10-12 DIAGNOSIS — Z91040 Latex allergy status: Secondary | ICD-10-CM | POA: Insufficient documentation

## 2023-10-12 DIAGNOSIS — I10 Essential (primary) hypertension: Secondary | ICD-10-CM | POA: Diagnosis not present

## 2023-10-12 DIAGNOSIS — I2510 Atherosclerotic heart disease of native coronary artery without angina pectoris: Secondary | ICD-10-CM | POA: Diagnosis not present

## 2023-10-12 DIAGNOSIS — K429 Umbilical hernia without obstruction or gangrene: Secondary | ICD-10-CM | POA: Diagnosis not present

## 2023-10-12 DIAGNOSIS — M549 Dorsalgia, unspecified: Secondary | ICD-10-CM | POA: Diagnosis not present

## 2023-10-12 DIAGNOSIS — R079 Chest pain, unspecified: Secondary | ICD-10-CM | POA: Diagnosis not present

## 2023-10-12 LAB — URINALYSIS, ROUTINE W REFLEX MICROSCOPIC
Bilirubin Urine: NEGATIVE
Glucose, UA: NEGATIVE mg/dL
Hgb urine dipstick: NEGATIVE
Ketones, ur: NEGATIVE mg/dL
Leukocytes,Ua: NEGATIVE
Nitrite: NEGATIVE
Protein, ur: NEGATIVE mg/dL
Specific Gravity, Urine: 1.019 (ref 1.005–1.030)
pH: 7 (ref 5.0–8.0)

## 2023-10-12 LAB — CBC
HCT: 42.1 % (ref 36.0–46.0)
Hemoglobin: 13.6 g/dL (ref 12.0–15.0)
MCH: 30.4 pg (ref 26.0–34.0)
MCHC: 32.3 g/dL (ref 30.0–36.0)
MCV: 94.2 fL (ref 80.0–100.0)
Platelets: 224 10*3/uL (ref 150–400)
RBC: 4.47 MIL/uL (ref 3.87–5.11)
RDW: 13.4 % (ref 11.5–15.5)
WBC: 4.6 10*3/uL (ref 4.0–10.5)
nRBC: 0 % (ref 0.0–0.2)

## 2023-10-12 LAB — COMPREHENSIVE METABOLIC PANEL
ALT: 19 U/L (ref 0–44)
AST: 27 U/L (ref 15–41)
Albumin: 3.7 g/dL (ref 3.5–5.0)
Alkaline Phosphatase: 61 U/L (ref 38–126)
Anion gap: 7 (ref 5–15)
BUN: 15 mg/dL (ref 8–23)
CO2: 28 mmol/L (ref 22–32)
Calcium: 10.7 mg/dL — ABNORMAL HIGH (ref 8.9–10.3)
Chloride: 105 mmol/L (ref 98–111)
Creatinine, Ser: 0.93 mg/dL (ref 0.44–1.00)
GFR, Estimated: >60 mL/min (ref >60)
Glucose, Bld: 103 mg/dL — ABNORMAL HIGH (ref 70–99)
Potassium: 4.2 mmol/L (ref 3.5–5.1)
Sodium: 140 mmol/L (ref 135–145)
Total Bilirubin: 0.7 mg/dL (ref 0.0–1.2)
Total Protein: 7 g/dL (ref 6.5–8.1)

## 2023-10-12 LAB — TROPONIN I (HIGH SENSITIVITY)
Troponin I (High Sensitivity): 3 ng/L (ref <18)
Troponin I (High Sensitivity): 3 ng/L (ref <18)

## 2023-10-12 LAB — LIPASE, BLOOD: Lipase: 25 U/L (ref 11–51)

## 2023-10-12 MED ORDER — IOHEXOL 300 MG/ML  SOLN
100.0000 mL | Freq: Once | INTRAMUSCULAR | Status: AC | PRN
  Administered 2023-10-12: 100 mL via INTRAVENOUS

## 2023-10-12 NOTE — ED Notes (Signed)
 Patient transported to CT

## 2023-10-12 NOTE — ED Triage Notes (Signed)
 Pt bib pov w/ c/p left side abdominal pain radiating into her left flank. Pt reports pain is sharp and came on all of a sudden this morning when getting out of the bed. Pt thought maybe it was gas and had spouse massage the area, but that provided no relief. Pt denies any urinary symptoms, issues with BM, or known injury to the area. Pt reports no known unusual recent activity.

## 2023-10-12 NOTE — ED Provider Triage Note (Signed)
 Emergency Medicine Provider Triage Evaluation Note  Nicole Meadows , a 83 y.o. female  was evaluated in triage.  Pt complains of left flank pain.  Review of Systems  Positive: Short of breath Negative: Constipation  Physical Exam  BP (!) 128/57 (BP Location: Right Arm)   Pulse 66   Temp 98.3 F (36.8 C) (Oral)   Resp 18   Ht 5\' 5"  (1.651 m)   Wt 81.6 kg   SpO2 98%   BMI 29.95 kg/m  Gen:   Awake, no distress   Resp:  Normal effort  MSK:   Moves extremities without difficulty  Other:    Medical Decision Making  Medically screening exam initiated at 8:31 AM.  Appropriate orders placed.  VIRIGINIA AMENDOLA was informed that the remainder of the evaluation will be completed by another provider, this initial triage assessment does not replace that evaluation, and the importance of remaining in the ED until their evaluation is complete.     Terrilee Files, MD 10/12/23 [phone]

## 2023-10-12 NOTE — ED Notes (Signed)
 ED Provider at bedside.

## 2023-10-12 NOTE — Discharge Instructions (Signed)
 You were seen in the emergency department for some left flank pain radiating around into your chest.  You had blood work EKG chest x-ray and a CAT scan of your abdomen that did not show an obvious explanation for your symptoms.  I would start with symptomatic treatment, Tylenol ibuprofen, warm compress.  Follow-up with your regular doctor.  Return to the emergency department if any worsening or concerning symptoms

## 2023-10-12 NOTE — ED Provider Notes (Signed)
 [place] EMERGENCY DEPARTMENT AT [facility] Provider Note   CSN: [id number] Arrival date & time: 10/12/23  0865     History  Chief Complaint  Patient presents with   Abdominal Pain   Flank Pain    Nicole Meadows is a 83 y.o. female.  She has a history of coronary disease hypertension.  She said she woke up this morning and noted some sharp stabbing left flank pain.  It starts around her left scapula and wraps around under her left breast.  Worse with movement and taking a deep breath.  Does not really feel short of breath though.  No nausea or vomiting no constipation diarrhea or urinary symptoms.  No known trauma.  Tried rubbing the area and belching without any significant improvement.  No fevers or chills.  The history is provided by the patient.  Flank Pain This is a new problem. The current episode started 1 to 2 hours ago. The problem occurs constantly. The problem has not changed since onset.Associated symptoms include chest pain and abdominal pain. Pertinent negatives include no headaches and no shortness of breath. The symptoms are aggravated by bending and twisting. Nothing relieves the symptoms. She has tried rest for the symptoms. The treatment provided no relief.       Home Medications Prior to Admission medications   Medication Sig Start Date End Date Taking? Authorizing Provider  azelastine (ASTELIN) 137 MCG/SPRAY nasal spray Place 1 spray into the nose at bedtime. Use in each nostril as directed    [provider]  cholecalciferol (VITAMIN D) 1000 units tablet Take 2,000 Units by mouth daily.    [provider]  Cinnamon 500 MG capsule Take 500 mg by mouth daily.    [provider]  cycloSPORINE (RESTASIS) 0.05 % ophthalmic emulsion Place 1 drop into both eyes 2 (two) times daily.     [provider]  desonide (DESOWEN) 0.05 % cream Apply topically as needed.    [provider]  diclofenac Sodium (VOLTAREN) 1  % GEL Apply topically 4 (four) times daily.    [provider]  FLAX OIL-FISH OIL-BORAGE OIL PO Take 1 capsule by mouth daily.     [provider]  guaiFENesin-codeine 100-10 MG/5ML syrup Take 10 mLs by mouth 3 (three) times daily as needed for cough. 02/09/23   Particia Nearing, PA-C  methocarbamol (ROBAXIN) 500 MG tablet Take 1 tablet (500 mg total) by mouth at bedtime as needed (neck/occpital discomfort). 10/02/23   Butch Penny, NP  nitroGLYCERIN (NITROLINGUAL) 0.4 MG/SPRAY spray Place 1 spray under the tongue every 5 (five) minutes x 3 doses as needed for chest pain. Patient taking differently: Place 1 spray under the tongue as needed for chest pain. 08/25/15   Hilty, Lisette Abu, MD  nystatin cream (MYCOSTATIN) Apply topically 2 (two) times daily.    [provider]  pantoprazole (PROTONIX) 40 MG tablet Take 40 mg by mouth daily. 11/28/21   [provider]  rosuvastatin (CRESTOR) 5 MG tablet Take 5 mg by mouth every evening.     [provider]  telmisartan-hydrochlorothiazide (MICARDIS HCT) 80-12.5 MG per tablet Take 1 tablet by mouth daily. 07/08/13   Hilty, Lisette Abu, MD      Allergies    Hydrocodone, Molds & smuts, Zocor [simvastatin], Latex, and Penicillins    Review of Systems   Review of Systems  Constitutional:  Negative for fever.  Respiratory:  Negative for shortness of breath.   Cardiovascular:  Positive for chest pain.  Gastrointestinal:  Positive for abdominal pain.  Neurological:  Negative for headaches.    Physical Exam Updated Vital Signs BP (!) 128/57 (BP Location: Right Arm)   Pulse 66   Temp 98.3 F (36.8 C) (Oral)   Resp 18   Ht 5\' 5"  (1.651 m)   Wt 81.6 kg   SpO2 98%   BMI 29.95 kg/m  Physical Exam Vitals and nursing note reviewed.  Constitutional:      General: She is not in acute distress.    Appearance: Normal appearance. She is well-developed.  HENT:     Head: Normocephalic and atraumatic.  Eyes:      Conjunctiva/sclera: Conjunctivae normal.  Cardiovascular:     Rate and Rhythm: Normal rate and regular rhythm.     Heart sounds: No murmur heard. Pulmonary:     Effort: Pulmonary effort is normal. No respiratory distress.     Breath sounds: Normal breath sounds. No wheezing or rhonchi.  Abdominal:     General: Abdomen is flat.     Palpations: Abdomen is soft.     Tenderness: There is no abdominal tenderness. There is no guarding or rebound.  Musculoskeletal:        General: No deformity. Normal range of motion.     Cervical back: Neck supple.  Skin:    General: Skin is warm and dry.     Capillary Refill: Capillary refill takes less than 2 seconds.     Findings: No rash.     Comments: No skin changes on posterior chest  Neurological:     General: No focal deficit present.     Mental Status: She is alert.     Sensory: No sensory deficit.     Motor: No weakness.     ED Results / Procedures / Treatments   Labs (all labs ordered are listed, but only abnormal results are displayed) Labs Reviewed  COMPREHENSIVE METABOLIC PANEL - Abnormal; Notable for the following components:      Result Value   Glucose, Bld 103 (*)    Calcium 10.7 (*)    All other components within normal limits  CBC  URINALYSIS, ROUTINE W REFLEX MICROSCOPIC  LIPASE, BLOOD  TROPONIN I (HIGH SENSITIVITY)  TROPONIN I (HIGH SENSITIVITY)    EKG EKG Interpretation Date/Time:  Thursday October 12 2023 09:16:38 EDT Ventricular Rate:  59 PR Interval:  181 QRS Duration:  116 QT Interval:  426 QTC Calculation: 422 R Axis:   -47  Text Interpretation: Sinus rhythm LAD, consider left anterior fascicular block Left ventricular hypertrophy No significant change since prior 1/25 Confirmed by Meridee Score [phone]) on 10/12/2023 9:21:08 AM  Radiology CT ABDOMEN PELVIS W CONTRAST Result Date: 10/12/2023 CLINICAL DATA:  Left flank and left upper quadrant pain. EXAM: CT ABDOMEN AND PELVIS WITH CONTRAST TECHNIQUE:  Multidetector CT imaging of the abdomen and pelvis was performed using the standard protocol following bolus administration of intravenous contrast. RADIATION DOSE REDUCTION: This exam was performed according to the departmental dose-optimization program which includes automated exposure control, adjustment of the mA and/or kV according to patient size and/or use of iterative reconstruction technique. CONTRAST:  OMNIPAQUE IOHEXOL 300 MG/ML  SOLN COMPARISON:  CT angiography abdomen and pelvis from 03/25/2015. FINDINGS: Lower chest: There are dependent atelectatic changes in the visualized lung bases. No overt consolidation. No pleural effusion. The heart is normal in size. No pericardial effusion. Hepatobiliary: The liver is normal in size. Non-cirrhotic configuration. No suspicious mass. These is  mild diffuse hepatic steatosis. No intrahepatic or extrahepatic bile duct dilation. No calcified gallstones. Normal gallbladder wall thickness. No pericholecystic inflammatory changes. Pancreas: Unremarkable. No pancreatic ductal dilatation or surrounding inflammatory changes. Spleen: Within normal limits. No focal lesion. Note is made of bilateral extrarenal pelves. Adrenals/Urinary Tract: Adrenal glands are unremarkable. No suspicious renal mass. No hydronephrosis. No renal or ureteric calculi. Urinary bladder is under distended, precluding optimal assessment. However, no large mass or stones identified. No perivesical fat stranding. Stomach/Bowel: There is a small sliding hiatal hernia. No disproportionate dilation of the small or large bowel loops. No evidence of abnormal bowel wall thickening or inflammatory changes. The appendix is unremarkable. Vascular/Lymphatic: No ascites or pneumoperitoneum. No abdominal or pelvic lymphadenopathy, by size criteria. No aneurysmal dilation of the major abdominal arteries. There are moderate peripheral atherosclerotic vascular calcifications of the aorta and its major  branches. Reproductive: The uterus is surgically absent. No large adnexal mass. Other: There is a tiny fat containing umbilical hernia. The soft tissues and abdominal wall are otherwise unremarkable. Musculoskeletal: No suspicious osseous lesions. There are mild multilevel degenerative changes in the visualized spine. IMPRESSION: 1. No acute inflammatory process identified within the abdomen or pelvis. 2. No nephroureterolithiasis or obstructive uropathy. 3. Multiple other nonacute observations, as described above. Aortic Atherosclerosis (ICD10-I70.0). Electronically Signed   By: Jules Schick M.D.   On: 10/12/2023 12:06   DG Chest 2 View Result Date: 10/12/2023 CLINICAL DATA:  Left-sided chest and back pain. EXAM: CHEST - 2 VIEW COMPARISON:  02/13/2023 FINDINGS: The heart size and mediastinal contours are within normal limits. Both lungs are clear. The visualized skeletal structures are unremarkable. IMPRESSION: No active cardiopulmonary disease. Electronically Signed   By: Danae Orleans M.D.   On: 10/12/2023 08:49    Procedures Procedures    Medications Ordered in ED Medications  iohexol (OMNIPAQUE) 300 MG/ML solution 100 mL (100 mLs Intravenous Contrast Given 10/12/23 1118)    ED Course/ Medical Decision Making/ A&P Clinical Course as of 10/12/23 1630  Thu Oct 12, 2023  0854 Chest x-ray interpreted by me as no acute infiltrate.  Awaiting radiology reading. [MB]  1213 Viewed results of workup with her.  She is comfortable plan for discharge and symptomatic treatment.  Recommended close follow-up with her PCP.  Return instructions discussed [MB]    Clinical Course User Index [MB] Terrilee Files, MD                                 Medical Decision Making Amount and/or Complexity of Data Reviewed Labs: ordered. Radiology: ordered.  Risk Prescription drug management.   This patient complains of left flank pain; this involves an extensive number of treatment Options and is a  complaint that carries with it a high risk of complications and morbidity. The differential includes musculoskeletal pain, pyelonephritis, renal colic, pneumonia, PE, ACS  I ordered, reviewed and interpreted labs, which included CBC normal chemistries LFTs normal troponins flat urinalysis negative I ordered imaging studies which included chest x-ray and CT abdomen and pelvis and I independently    visualized and interpreted imaging which showed no acute findings Additional history obtained from patient's family members Previous records obtained and reviewed in epic no recent admissions Cardiac monitoring reviewed, sinus rhythm Social determinants considered, no significant barriers Critical Interventions: None  After the interventions stated above, I reevaluated the patient and found patient's pain to be slightly improved Admission and further testing  considered, no indications for admission at this time.  Will have patient treat symptomatically and close follow-up with PCP.  Return instructions discussed.         Final Clinical Impression(s) / ED Diagnoses Final diagnoses:  Left flank pain    Rx / DC Orders ED Discharge Orders     None         Terrilee Files, MD 10/12/23 [id number]

## 2023-10-20 DIAGNOSIS — M940 Chondrocostal junction syndrome [Tietze]: Secondary | ICD-10-CM | POA: Diagnosis not present

## 2023-11-24 DIAGNOSIS — M940 Chondrocostal junction syndrome [Tietze]: Secondary | ICD-10-CM | POA: Diagnosis not present

## 2023-11-24 DIAGNOSIS — I10 Essential (primary) hypertension: Secondary | ICD-10-CM | POA: Diagnosis not present

## 2023-11-24 DIAGNOSIS — M542 Cervicalgia: Secondary | ICD-10-CM | POA: Diagnosis not present

## 2024-01-12 DIAGNOSIS — M1389 Other specified arthritis, multiple sites: Secondary | ICD-10-CM | POA: Diagnosis not present

## 2024-01-12 DIAGNOSIS — L821 Other seborrheic keratosis: Secondary | ICD-10-CM | POA: Diagnosis not present

## 2024-04-11 DIAGNOSIS — H43812 Vitreous degeneration, left eye: Secondary | ICD-10-CM | POA: Diagnosis not present

## 2024-04-11 DIAGNOSIS — Z961 Presence of intraocular lens: Secondary | ICD-10-CM | POA: Diagnosis not present

## 2024-04-11 DIAGNOSIS — S0512XA Contusion of eyeball and orbital tissues, left eye, initial encounter: Secondary | ICD-10-CM | POA: Diagnosis not present

## 2024-04-14 IMAGING — US US EXTREM LOW VENOUS*L*
1 series · 13 of 24 positions shown · non-contrast
Comparison: None Available.

CLINICAL DATA: Left lower extremity pain and edema for the past 2-3
weeks. Evaluate for DVT.



[Series 1: us venous img lower uni left (dvt) · portal-venous · 13 of 41 slices shown]
[im 1/41]
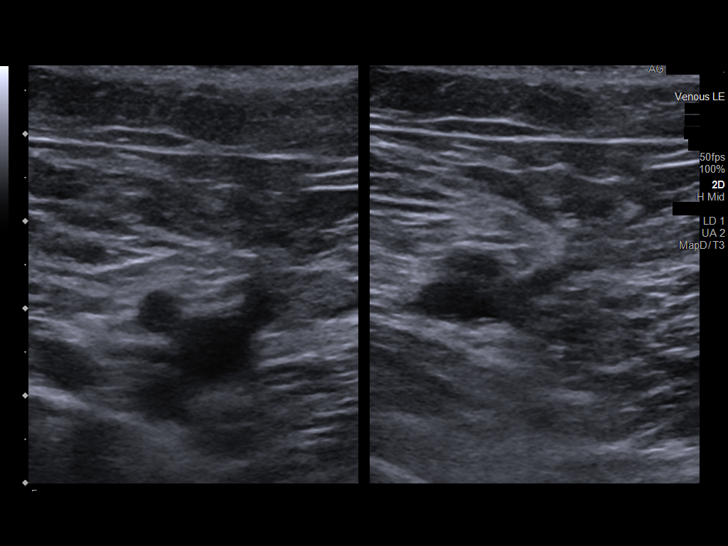
[im 4/41]
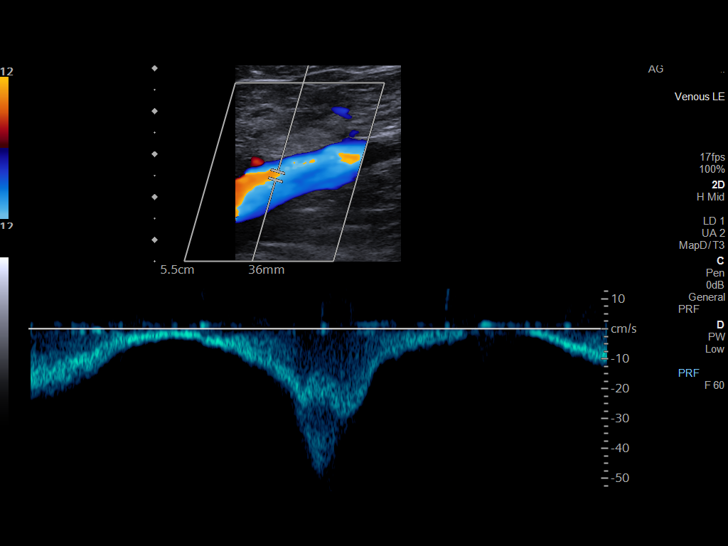
[im 7/41]
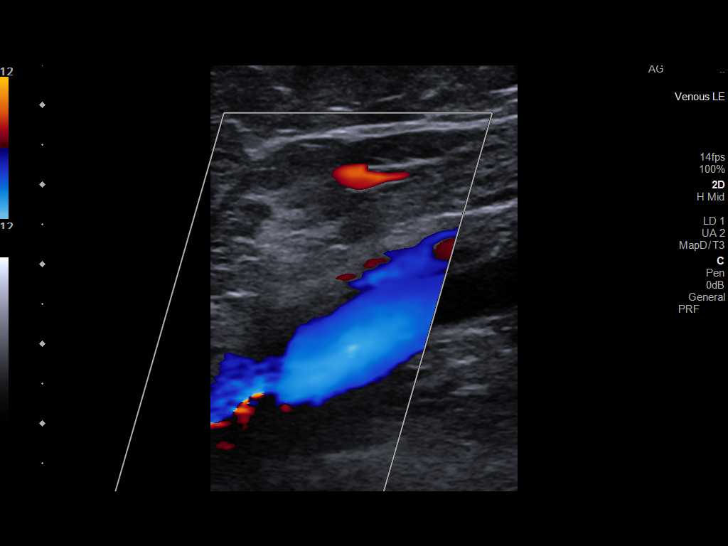
[im 11/41]
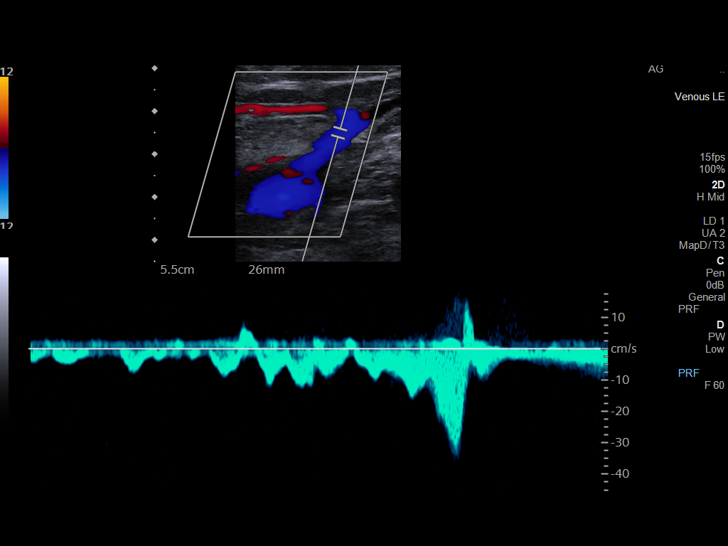
[im 14/41]
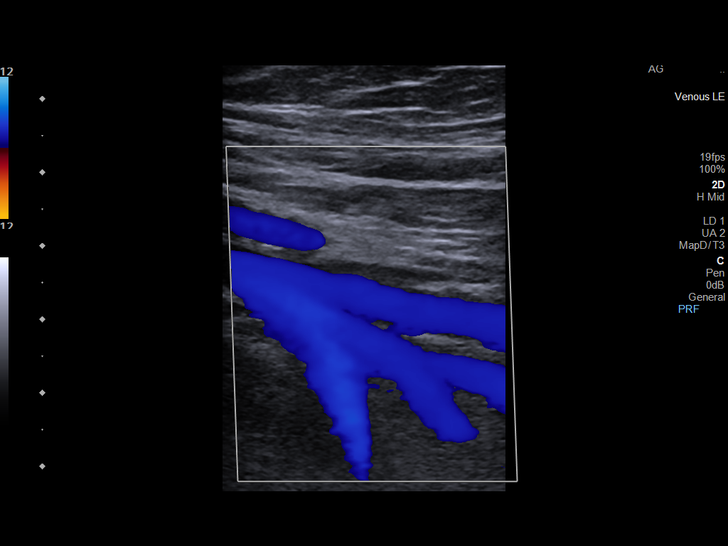
[im 18/41]
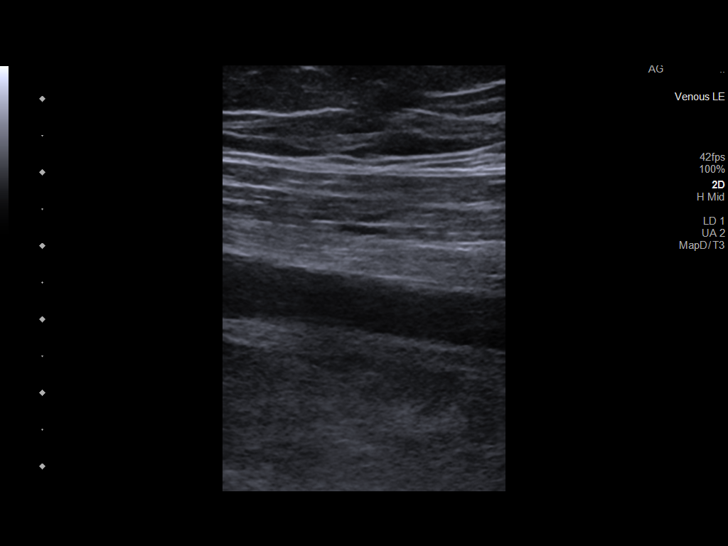
[im 21/41]
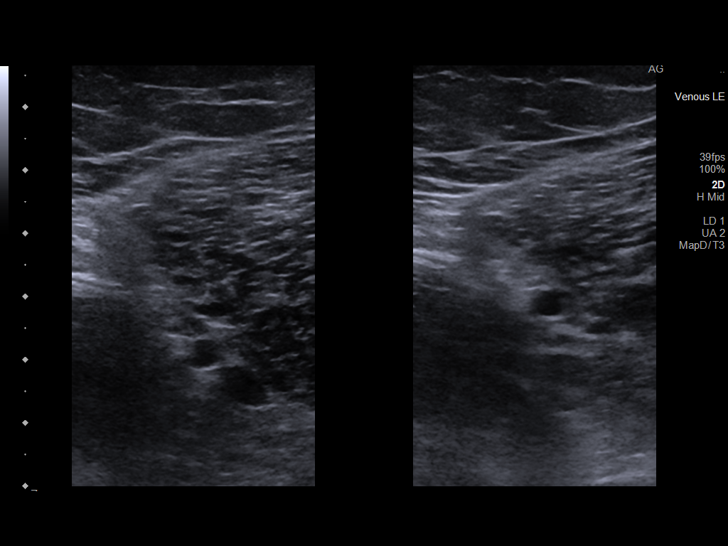
[im 23/41]
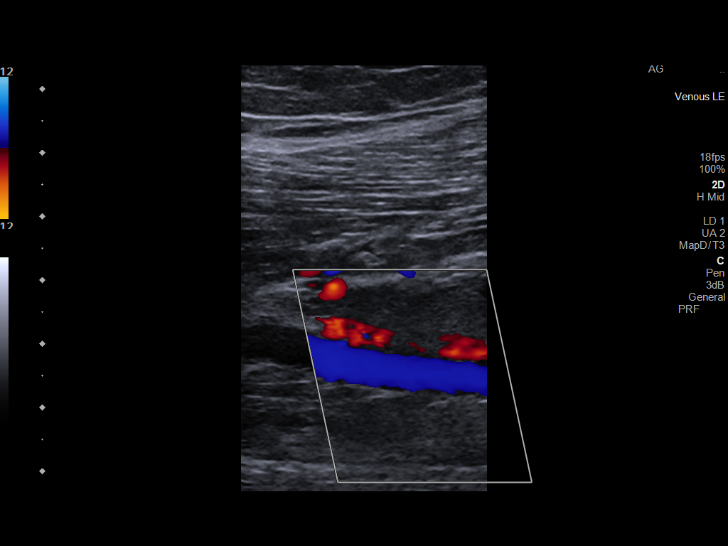
[im 27/41]
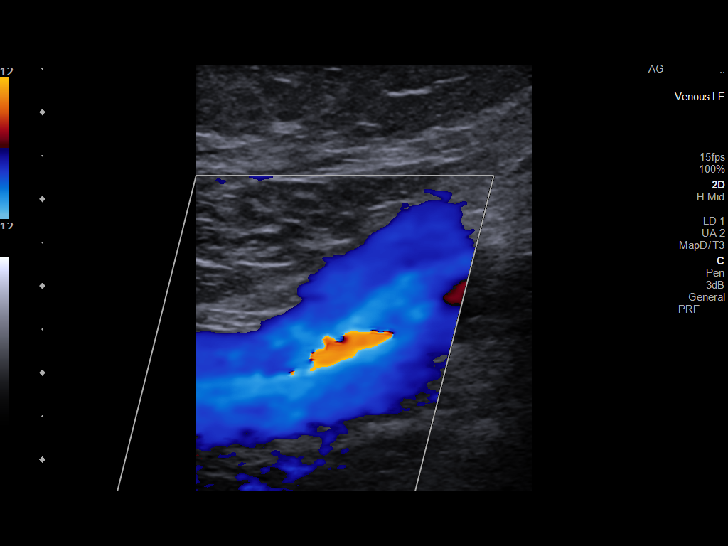
[im 30/41]
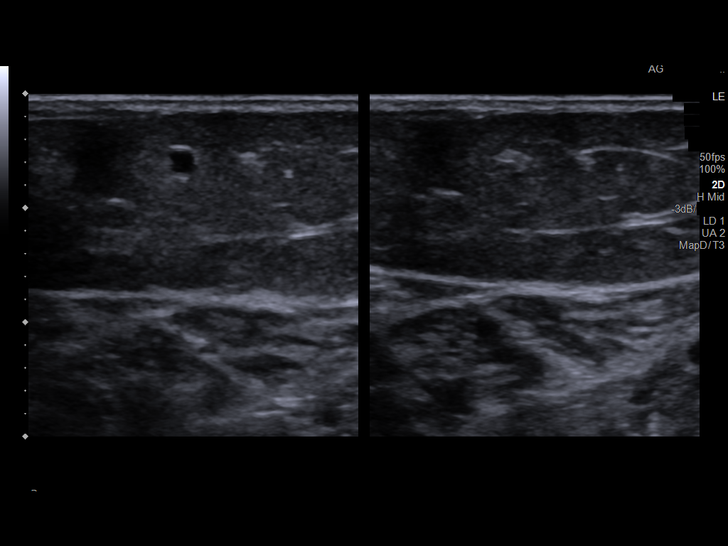
[im 34/41]
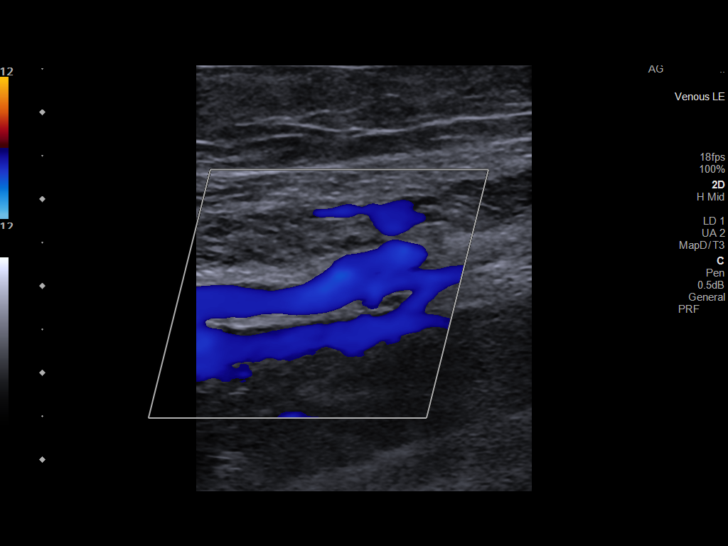
[im 37/41]
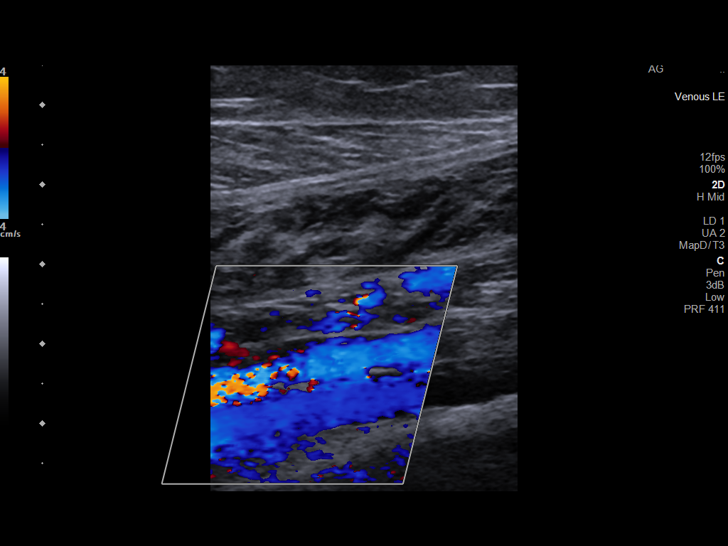
[im 41/41]
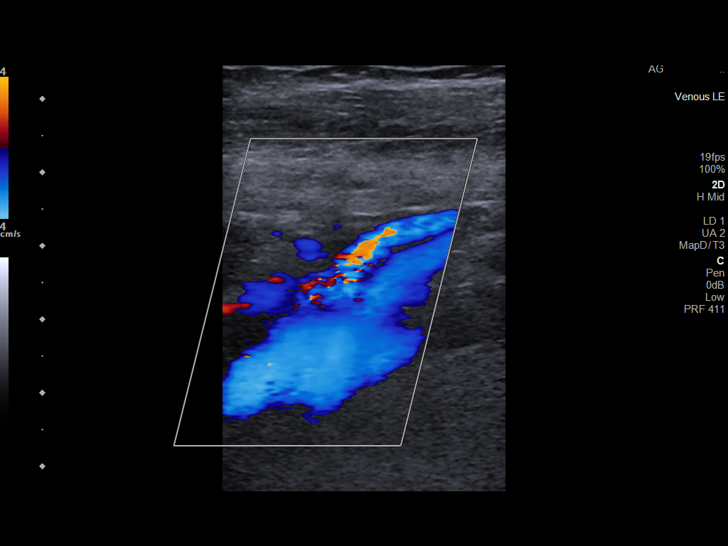

[13 of 24 positions shown; findings below may reference images not displayed]

FINDINGS: Contralateral Common Femoral Vein: Respiratory phasicity is normal
and symmetric with the symptomatic side. No evidence of thrombus.
Normal compressibility.

Common Femoral Vein: No evidence of thrombus. Normal
compressibility, respiratory phasicity and response to augmentation.

Saphenofemoral Junction: No evidence of thrombus. Normal
compressibility and flow on color Doppler imaging.

Profunda Femoral Vein: No evidence of thrombus. Normal
compressibility and flow on color Doppler imaging.

Femoral Vein: No evidence of thrombus. Normal compressibility,
respiratory phasicity and response to augmentation.

Popliteal Vein: No evidence of thrombus. Normal compressibility,
respiratory phasicity and response to augmentation.

Calf Veins: No evidence of thrombus. Normal compressibility and flow
on color Doppler imaging.

Superficial Great Saphenous Vein: No evidence of thrombus. Normal
compressibility.

Other Findings:  None.
IMPRESSION: No evidence of DVT within the left lower extremity.

## 2024-04-15 IMAGING — DX DG CHEST 1V PORT
1 series · 1 of 1 positions shown · non-contrast
Comparison: Portable exam 1612 hours compared to 01/28/2018

CLINICAL DATA: Chest pain

EXAM:
PORTABLE CHEST 1 VIEW

[chest ap]
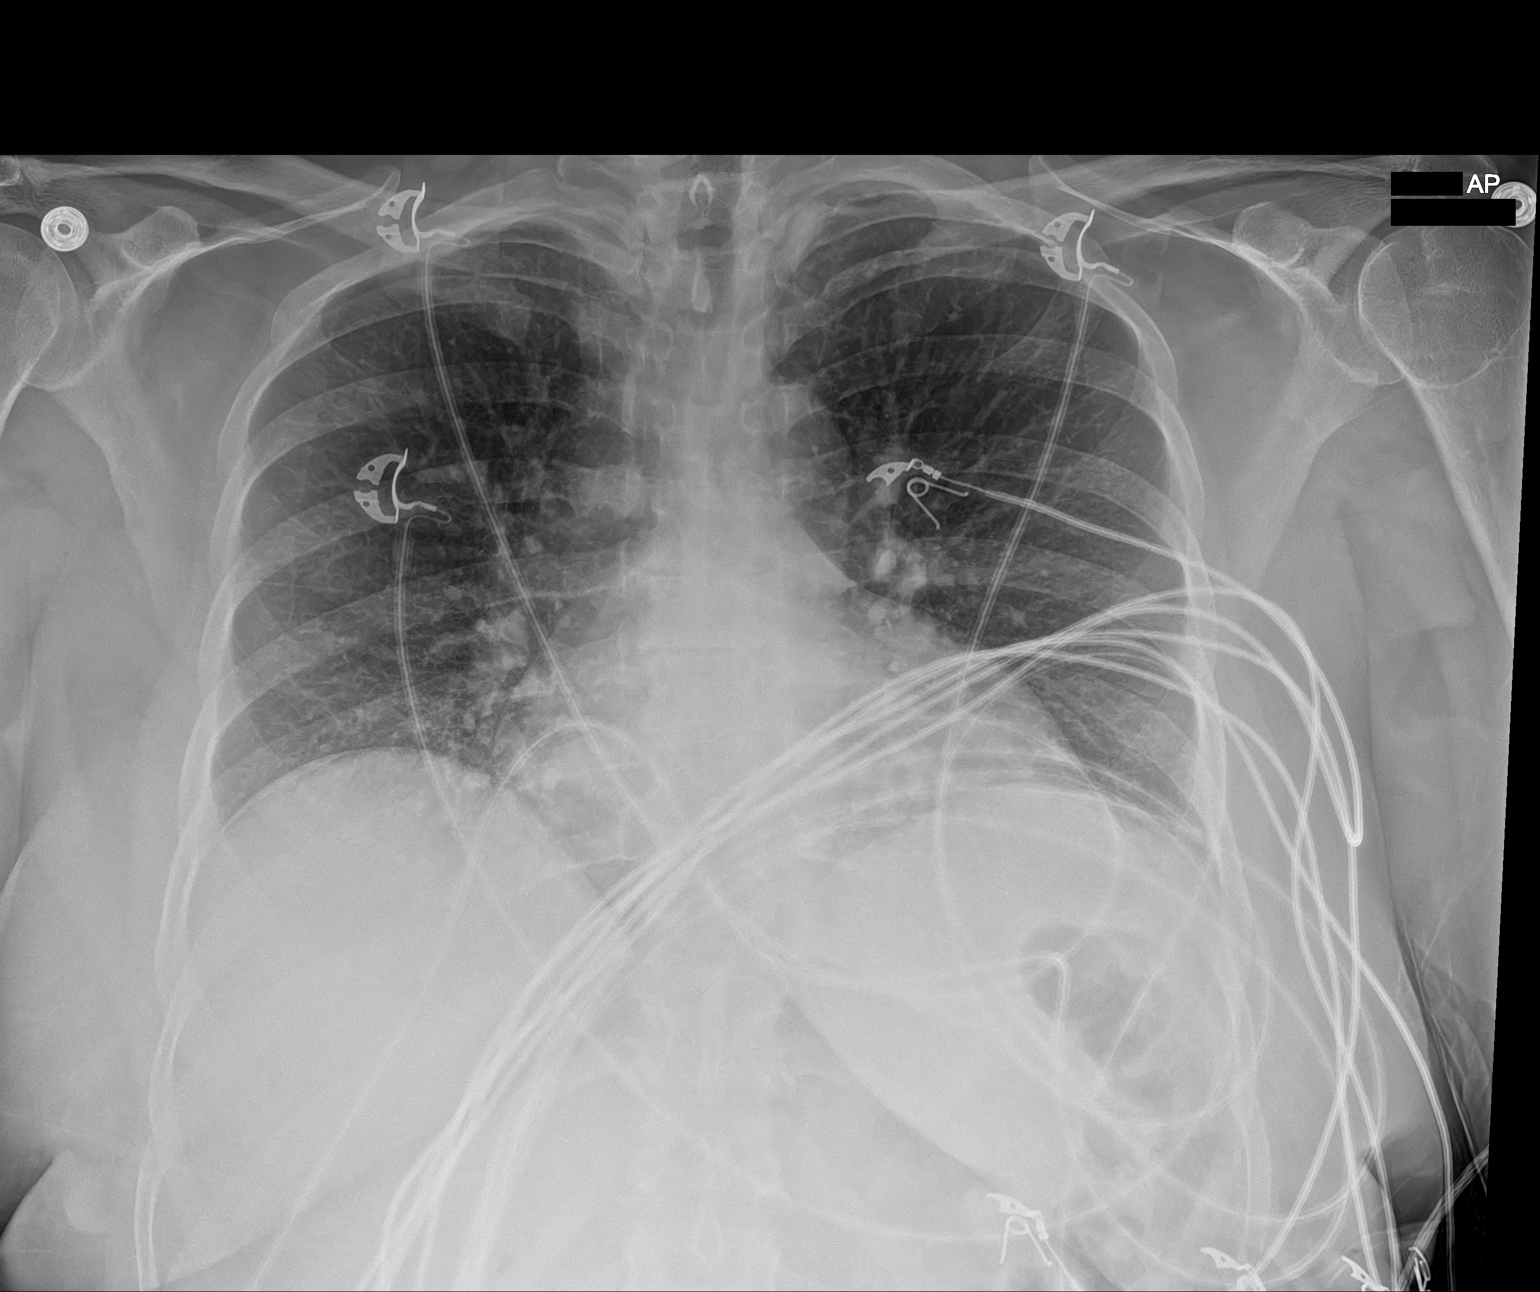

[1 of 1 positions shown; findings below may reference images not displayed]

FINDINGS: Normal heart size, mediastinal contours, and pulmonary vascularity.

Minimal bibasilar atelectasis.

Lungs otherwise clear.

No pulmonary infiltrate, pleural effusion, or pneumothorax.

No acute osseous findings.
IMPRESSION: Bibasilar atelectasis.

## 2024-04-18 DIAGNOSIS — S0512XD Contusion of eyeball and orbital tissues, left eye, subsequent encounter: Secondary | ICD-10-CM | POA: Diagnosis not present

## 2024-04-18 DIAGNOSIS — Z961 Presence of intraocular lens: Secondary | ICD-10-CM | POA: Diagnosis not present

## 2024-04-18 DIAGNOSIS — H43812 Vitreous degeneration, left eye: Secondary | ICD-10-CM | POA: Diagnosis not present

## 2024-05-16 DIAGNOSIS — G259 Extrapyramidal and movement disorder, unspecified: Secondary | ICD-10-CM | POA: Diagnosis not present

## 2024-05-16 DIAGNOSIS — E559 Vitamin D deficiency, unspecified: Secondary | ICD-10-CM | POA: Diagnosis not present

## 2024-05-16 DIAGNOSIS — E119 Type 2 diabetes mellitus without complications: Secondary | ICD-10-CM | POA: Diagnosis not present

## 2024-05-16 DIAGNOSIS — I10 Essential (primary) hypertension: Secondary | ICD-10-CM | POA: Diagnosis not present

## 2024-05-16 DIAGNOSIS — Z00129 Encounter for routine child health examination without abnormal findings: Secondary | ICD-10-CM | POA: Diagnosis not present

## 2024-05-16 DIAGNOSIS — R251 Tremor, unspecified: Secondary | ICD-10-CM | POA: Diagnosis not present

## 2024-06-07 ENCOUNTER — Ambulatory Visit: Admitting: Orthopedic Surgery

## 2024-06-07 ENCOUNTER — Encounter: Payer: Self-pay | Admitting: Orthopedic Surgery

## 2024-06-07 DIAGNOSIS — M25512 Pain in left shoulder: Secondary | ICD-10-CM

## 2024-06-07 DIAGNOSIS — M47812 Spondylosis without myelopathy or radiculopathy, cervical region: Secondary | ICD-10-CM

## 2024-06-07 DIAGNOSIS — G8929 Other chronic pain: Secondary | ICD-10-CM

## 2024-06-07 DIAGNOSIS — M7542 Impingement syndrome of left shoulder: Secondary | ICD-10-CM

## 2024-06-07 MED ORDER — METHOCARBAMOL 500 MG PO TABS: 500.0000 mg | ORAL_TABLET | Freq: Every evening | ORAL | 5 refills | Status: AC | PRN

## 2024-06-07 MED ORDER — METHYLPREDNISOLONE ACETATE 40 MG/ML IJ SUSP
40.0000 mg | Freq: Once | INTRAMUSCULAR | Status: AC
  Administered 2024-06-07: 40 mg via INTRA_ARTICULAR

## 2024-06-07 NOTE — Progress Notes (Signed)
   Procedure note for injection   Chief Complaint  Patient presents with   Shoulder Pain    Left wants injection     Encounter Diagnoses  Name Primary?   Chronic left shoulder pain Yes   Impingement syndrome of left shoulder       Nicole Meadows has shoulder and neck pain she has a history of cervical spondylosis without myelopathy as well as impingement syndrome left shoulder.  She got good relief from the left shoulder injection in November 2022  She would also like the muscle relaxer Robaxin  restarted.  Gabapentin  did not help her much    The patient has consented for injection of the left subacromial space  Medication: Depo-Medrol  40 mg and lidocaine  1%  Time out completed: Yes  The site of injection was cleaned with alcohol and ethyl chloride.  The injection was given without any complications appropriate precautions were given.

## 2024-06-11 DIAGNOSIS — H43812 Vitreous degeneration, left eye: Secondary | ICD-10-CM | POA: Diagnosis not present

## 2024-07-08 ENCOUNTER — Other Ambulatory Visit (HOSPITAL_COMMUNITY): Payer: Self-pay | Admitting: Family Medicine

## 2024-07-08 DIAGNOSIS — R1319 Other dysphagia: Secondary | ICD-10-CM

## 2024-07-12 ENCOUNTER — Ambulatory Visit (HOSPITAL_COMMUNITY)
Admission: RE | Admit: 2024-07-12 | Discharge: 2024-07-12 | Disposition: A | Discharge status: Home / Self Care | Source: Ambulatory Visit | Attending: Family Medicine | Admitting: Family Medicine

## 2024-07-12 DIAGNOSIS — R1319 Other dysphagia: Secondary | ICD-10-CM | POA: Insufficient documentation

## 2024-10-07 ENCOUNTER — Ambulatory Visit: Admitting: Adult Health
